# Patient Record
Sex: Male | Born: 1955
Health system: Southern US, Community
[De-identification: ages and names within clinical notes are randomized; demographics above are authoritative.]

## PROBLEM LIST (undated history)

## (undated) DIAGNOSIS — E079 Disorder of thyroid, unspecified: Secondary | ICD-10-CM

## (undated) HISTORY — PX: NO PAST SURGERIES: SHX2092

## (undated) HISTORY — DX: Disorder of thyroid, unspecified: E07.9

---

## 1978-09-23 HISTORY — PX: COLONOSCOPY: SHX174

## 2011-11-13 ENCOUNTER — Encounter (HOSPITAL_COMMUNITY): Payer: Self-pay

## 2011-11-13 ENCOUNTER — Emergency Department (HOSPITAL_COMMUNITY)
Admission: EM | Admit: 2011-11-13 | Discharge: 2011-11-13 | Disposition: A | Discharge status: Home / Self Care | Payer: Self-pay | Attending: Emergency Medicine | Admitting: Emergency Medicine

## 2011-11-13 DIAGNOSIS — T7840XA Allergy, unspecified, initial encounter: Secondary | ICD-10-CM

## 2011-11-13 DIAGNOSIS — R21 Rash and other nonspecific skin eruption: Secondary | ICD-10-CM | POA: Insufficient documentation

## 2011-11-13 DIAGNOSIS — R220 Localized swelling, mass and lump, head: Secondary | ICD-10-CM | POA: Insufficient documentation

## 2011-11-13 MED ORDER — PREDNISONE 10 MG PO TABS: 20.0000 mg | ORAL_TABLET | Freq: Every day | ORAL | Status: DC

## 2011-11-13 MED ORDER — METHYLPREDNISOLONE SODIUM SUCC 125 MG IJ SOLR
125.0000 mg | Freq: Once | INTRAMUSCULAR | Status: AC
  Administered 2011-11-13: 125 mg via INTRAVENOUS
  Filled 2011-11-13: qty 2

## 2011-11-13 MED ORDER — FAMOTIDINE IN NACL 20-0.9 MG/50ML-% IV SOLN
20.0000 mg | Freq: Once | INTRAVENOUS | Status: AC
  Administered 2011-11-13: 20 mg via INTRAVENOUS
  Filled 2011-11-13: qty 50

## 2011-11-13 MED ORDER — DIPHENHYDRAMINE HCL 50 MG/ML IJ SOLN
25.0000 mg | Freq: Once | INTRAMUSCULAR | Status: AC
  Administered 2011-11-13: 25 mg via INTRAVENOUS
  Filled 2011-11-13: qty 1

## 2011-11-13 NOTE — ED Notes (Signed)
Pt reports broke out in rash on upper arms and legs x 1 week.  PT says he thinks it is coming from his soap or laundry detergent.  Today woke up with upper lip swollen.  Denies tongue swelling or feeling like throat closing.

## 2011-11-13 NOTE — ED Notes (Signed)
Pt reports some minor itching, no complaint of pain, no acute distress.  Red areas noted to upper extremities near the antecubital area.

## 2011-11-13 NOTE — ED Provider Notes (Signed)
History  This chart was scribed for Robert Lennert, MD by Bennett Scrape. This patient was seen in room [facility]09/[facility]09 and the patient's care was started at 4:55PM.  CSN: [id number]  Arrival date & time 11/13/11  1559   First MD Initiated Contact with Patient 11/13/11 1655    Chief Complaint  Patient presents with  . Rash     Patient is a 56 y.o. male presenting with rash. The history is provided by the patient. No language interpreter was used.  Rash  This is a new problem. The current episode started more than 2 days ago. The problem has not changed since onset.There has been no fever. The rash is present on the torso, back, left lower leg, left upper leg, right lower leg and right upper leg. The patient is experiencing no pain. Associated symptoms include itching.    Robert [name] is a 56 y.o. male who presents to the Emergency Department complaining of one week of gradual onset, constant rash described as itching on torso, back, bilateral arms and bilateral legs with associated 12 hours of gradual onset, gradually worsening upper lip swelling that he attributes to possibly an allergic reaction to new laundry detergent. He reports that he used cortisone 10 cream and took benadryl with moderate, temporary improvement in his symptoms. 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 He states that he was treated with adrenaline with resolvement of his symptoms. He denies trouble breathing, trouble swallowing, subsequent tongue swelling or fevers as associated symptoms. He does not have a h/o chronic medical conditions and denies smoking and alcohol use.  No PCP  History reviewed. No pertinent past medical history.  History reviewed. No pertinent past surgical history.  No family history on file.  History  Substance Use Topics  . Smoking status: Never Smoker   . Smokeless tobacco: Not on file  . Alcohol Use: No       Review of Systems  Constitutional: Negative for fatigue.  HENT: Positive for facial swelling (upper lip). Negative for congestion, sinus pressure and ear discharge.   Eyes: Negative for discharge.  Respiratory: Negative for cough.   Cardiovascular: Negative for chest pain.  Gastrointestinal: Negative for abdominal pain and diarrhea.  Genitourinary: Negative for frequency and hematuria.  Musculoskeletal: Negative for back pain.  Skin: Positive for itching and rash.  Neurological: Negative for seizures and headaches.  Hematological: Negative.   Psychiatric/Behavioral: Negative for hallucinations.    Allergies  Review of patient's allergies indicates no known allergies.  Home Medications   Current Outpatient Rx  Name Route Sig Dispense Refill  . DIPHENHYDRAMINE HCL 25 MG PO TABS Oral Take 25-50 mg by mouth daily as needed. For allergic reaction      Triage Vitals: BP 133/80  Pulse 73  Temp 98.2 F (36.8 C) (Oral)  Resp 20  Ht 5\' 2"  (1.575 m)  Wt 185 lb (83.915 kg)  BMI 33.84 kg/m2  SpO2 98%  Physical Exam  Nursing note and vitals reviewed. Constitutional: He is oriented to person, place, and time. He appears well-developed and well-nourished.  HENT:  Head: Normocephalic and atraumatic.       Mild upper lip swelling that is worse on the left  Eyes: Conjunctivae normal and EOM are normal. No scleral icterus.  Neck: Neck supple. No thyromegaly present.  Cardiovascular: Normal rate and regular rhythm.  Exam reveals no gallop and no friction rub.  No murmur heard. Pulmonary/Chest: Effort normal and breath sounds normal. No stridor. He has no wheezes. He has no rales. He exhibits no tenderness.  Abdominal: He exhibits no distension. There is no tenderness. There is no rebound.  Musculoskeletal: Normal range of motion. He exhibits no edema.  Lymphadenopathy:    He has no cervical adenopathy.  Neurological: He is alert and oriented to person, place, and time.  Coordination normal.  Skin: Skin is warm and dry. Rash noted. No erythema.       Macular papular rash on the torso, legs, arms and back  Psychiatric: He has a normal mood and affect. His behavior is normal.    ED Course  Procedures (including critical care time)  DIAGNOSTIC STUDIES: Oxygen Saturation is 98 on room air, normal as interpreted by me.   COORDINATION OF CARE: 4:59PM- Patient informed of current plan for treatment and evaluation and agrees with plan at this time.   5:15PM-Ordered 20 mg IVPB Pepcid, 25 mg injection of Benadryl and 125 mg injection of Solumedrol   7:30 PM-Pt rechecked and feels improved after medications. Upon re-exam, pt's rash has also improved.  Discussed discharge plan which includes prednisone for 4 to 5 days with pt and pt agreed to plan.  Labs Reviewed - No data to display No results found.   No diagnosis found.    MDM      The chart was scribed for me under my direct supervision.  I personally performed the history, physical, and medical decision making and all procedures in the evaluation of this patient.Robert Lennert, MD 11/13/11 [phone]

## 2011-11-20 ENCOUNTER — Emergency Department (HOSPITAL_COMMUNITY)
Admission: EM | Admit: 2011-11-20 | Discharge: 2011-11-20 | Disposition: A | Discharge status: Home / Self Care | Payer: Self-pay | Attending: Emergency Medicine | Admitting: Emergency Medicine

## 2011-11-20 ENCOUNTER — Encounter (HOSPITAL_COMMUNITY): Payer: Self-pay | Admitting: *Deleted

## 2011-11-20 DIAGNOSIS — L259 Unspecified contact dermatitis, unspecified cause: Secondary | ICD-10-CM | POA: Insufficient documentation

## 2011-11-20 DIAGNOSIS — L309 Dermatitis, unspecified: Secondary | ICD-10-CM

## 2011-11-20 MED ORDER — HYDROXYZINE HCL 25 MG PO TABS
ORAL_TABLET | ORAL | Status: AC
  Administered 2011-11-20: 50 mg
  Filled 2011-11-20: qty 2

## 2011-11-20 MED ORDER — HYDROXYZINE HCL 50 MG/ML IM SOLN: 50.0000 mg | Freq: Once | INTRAMUSCULAR | Status: DC

## 2011-11-20 MED ORDER — FAMOTIDINE 20 MG PO TABS
20.0000 mg | ORAL_TABLET | Freq: Once | ORAL | Status: AC
  Administered 2011-11-20: 20 mg via ORAL
  Filled 2011-11-20: qty 1

## 2011-11-20 MED ORDER — PREDNISONE 20 MG PO TABS: 40.0000 mg | ORAL_TABLET | Freq: Every day | ORAL | Status: DC

## 2011-11-20 MED ORDER — METHYLPREDNISOLONE SODIUM SUCC 125 MG IJ SOLR
125.0000 mg | Freq: Once | INTRAMUSCULAR | Status: AC
  Administered 2011-11-20: 125 mg via INTRAMUSCULAR
  Filled 2011-11-20: qty 2

## 2011-11-20 NOTE — ED Notes (Signed)
Pt states has had this rash before and went away while taking the prednisone. Rash is predominately on upper back at this time. Pt reports rash was covering his back earlier in the day. Denies new products, denies SOB. NAD noted

## 2011-11-20 NOTE — ED Notes (Signed)
Itching rash, seen here for same, had been getting better,but started having rash again when finished the prednisone.

## 2011-11-20 NOTE — ED Provider Notes (Signed)
History     CSN: [id number]  Arrival date & time 11/20/11  1639   None     Chief Complaint  Patient presents with  . Rash    (Consider location/radiation/quality/duration/timing/severity/associated sxs/prior treatment) Patient is a 56 y.o. male presenting with rash. The history is provided by the patient.  Rash  This is a new problem. The current episode started 3 to 5 hours ago. The problem has been gradually worsening. The problem is associated with an unknown factor. There has been no fever. The rash is present on the back, left arm, left buttock, right arm and right buttock. The patient is experiencing no pain. Associated symptoms include itching. Associated symptoms comments: hives. Treatments tried: steroid cream (otc) The treatment provided mild relief.    History reviewed. No pertinent past medical history.  History reviewed. No pertinent past surgical history.  History reviewed. No pertinent family history.  History  Substance Use Topics  . Smoking status: Never Smoker   . Smokeless tobacco: Not on file  . Alcohol Use: No      Review of Systems  Constitutional: Negative for activity change.       All ROS Neg except as noted in HPI  HENT: Negative for nosebleeds and neck pain.   Eyes: Negative for photophobia and discharge.  Respiratory: Negative for cough, shortness of breath and wheezing.   Cardiovascular: Negative for chest pain and palpitations.  Gastrointestinal: Negative for abdominal pain and blood in stool.  Genitourinary: Negative for dysuria, frequency and hematuria.  Musculoskeletal: Negative for back pain and arthralgias.  Skin: Positive for itching and rash.  Neurological: Negative for dizziness, seizures and speech difficulty.  Psychiatric/Behavioral: Negative for hallucinations and confusion.    Allergies  Review of patient's allergies indicates no known allergies.  Home Medications   Current Outpatient Rx  Name Route Sig Dispense Refill   . DIPHENHYDRAMINE HCL 25 MG PO TABS Oral Take 25-50 mg by mouth daily as needed. For allergic reaction    . PREDNISONE 10 MG PO TABS Oral Take 2 tablets (20 mg total) by mouth daily. 10 tablet 0    BP 151/76  Pulse 65  Temp 98 F (36.7 C) (Oral)  Resp 20  Ht 6' (1.829 m)  Wt 195 lb (88.451 kg)  BMI 26.45 kg/m2  SpO2 99%  Physical Exam  Nursing note and vitals reviewed. Constitutional: He is oriented to person, place, and time. He appears well-developed and well-nourished.  Non-toxic appearance.  HENT:  Head: Normocephalic.  Right Ear: Tympanic membrane and external ear normal.  Left Ear: Tympanic membrane and external ear normal.       No rash noted in the mouth. The uvula is in the midline, airway is patent. No swelling or abnormality of the tongue. No swelling under the tongue.  Eyes: EOM and lids are normal. Pupils are equal, round, and reactive to light.  Neck: Normal range of motion. Neck supple. Carotid bruit is not present.  Cardiovascular: Normal rate, regular rhythm, normal heart sounds, intact distal pulses and normal pulses.   Pulmonary/Chest: Breath sounds normal. No respiratory distress.  Abdominal: Soft. Bowel sounds are normal. There is no tenderness. There is no guarding.  Musculoskeletal: Normal range of motion.       Few hives noted on the right and left forearm. Hives noted on the upper back.  Lymphadenopathy:       Head (right side): No submandibular adenopathy present.       Head (left side): No submandibular  adenopathy present.    He has no cervical adenopathy.  Neurological: He is alert and oriented to person, place, and time. He has normal strength. No cranial nerve deficit or sensory deficit.  Skin: Skin is warm and dry.  Psychiatric: He has a normal mood and affect. His speech is normal.    ED Course  Procedures (including critical care time)  Labs Reviewed - No data to display No results found.   No diagnosis found.    MDM  I have  reviewed nursing notes, vital signs, and all appropriate lab and imaging results for this patient. Rx for  Deltasone given by Dr Algis Downs. Preston Fleeting.                                                                                                      Kathie Dike, [place] 11/23/11 [phone]

## 2011-11-20 NOTE — ED Provider Notes (Signed)
History     CSN: [id number]  Arrival date & time 11/20/11  1639   First MD Initiated Contact with Patient 11/20/11 1922      Chief Complaint  Patient presents with  . Rash    (Consider location/radiation/quality/duration/timing/severity/associated sxs/prior treatment) Patient is a 56 y.o. male presenting with rash. The history is provided by the patient.  Rash   He had broken out in a rash about a week ago was seen in the emergency department and sent home with prescription for prednisone. Rash away but started to come back today after completing his course of prednisone. Today, rashes on his buttock and on his back. He applied some hydrocortisone cream, and the rash on his buttock has resolved. The rash is quite paretic. He denies any difficulty breathing or swelling. He denies any unusual contacts. 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  History reviewed. No pertinent past medical history.  History reviewed. No pertinent past surgical history.  History reviewed. No pertinent family history.  History  Substance Use Topics  . Smoking status: Never Smoker   . Smokeless tobacco: Not on file  . Alcohol Use: No      Review of Systems  Skin: Positive for rash.  All other systems reviewed and are negative.    Allergies  Review of patient's allergies indicates no known allergies.  Home Medications   Current Outpatient Rx  Name Route Sig Dispense Refill  . DIPHENHYDRAMINE HCL 25 MG PO TABS Oral Take 25-50 mg by mouth daily as needed. For allergic reaction    . PREDNISONE 10 MG PO TABS Oral Take 2 tablets (20 mg total) by mouth daily. 10 tablet 0  . PREDNISONE 20 MG PO TABS Oral Take 2 tablets (40 mg total) by mouth daily. 20 tablet 0    BP 151/76  Pulse 65  Temp 98 F (36.7 C) (Oral)  Resp 20  Ht 6' (1.829 m)  Wt 195 lb (88.451 kg)  BMI 26.45 kg/m2  SpO2 99%  Physical  Exam  Nursing note and vitals reviewed. 56 year old male, resting comfortably and in no acute distress. Vital signs are significant for mild hypertension with blood pressure 120/76. Oxygen saturation is 99%, which is normal. Head is normocephalic and atraumatic. PERRLA, EOMI. Oropharynx is clear. Neck is nontender and supple without adenopathy or JVD. Back is nontender and there is no CVA tenderness. Lungs are clear without rales, wheezes, or rhonchi. Chest is nontender. Heart has regular rate and rhythm without murmur. Abdomen is soft, flat, nontender without masses or hepatosplenomegaly and peristalsis is normoactive. Extremities have no cyanosis or edema, full range of motion is present. Skin is warm and dry. There is a rash present in the left upper back which consists of a 3 probe of vesicles which are in a linear pattern and on erythematous base. Neurologic: Mental status is normal, cranial nerves are intact, there are no motor or sensory deficits.   ED Course  Procedures (including critical care time)  Labs Reviewed - No data to display No results found.   1. Dermatitis       MDM  Rash which has appearance of a contact dermatitis in the one area where I am able to see if. Linear grouped vesicles are strongly suggestive of contact dermatitis. Prior records are reviewed and he was given a five-day course of prednisone when seen 7  days ago. If it is a poison ivy type of rash, he will need several weeks of steroids. He is sent home with prescription for prednisone for the next 10 days and is to continue using over-the-counter antihistamines and topical hydrocortisone as needed.        Dione Booze, MD 11/20/11 1946

## 2011-11-23 NOTE — ED Provider Notes (Signed)
Medical screening examination/treatment/procedure(s) were performed by non-physician practitioner and as supervising physician I was immediately available for consultation/collaboration.   Laray Anger, DO 11/23/11 2340

## 2012-02-07 SURGERY — LEFT HEART CATH
Anesthesia: LOCAL

## 2012-02-07 NOTE — H&P (Signed)
Mrs. echo is 57 years of age and while exercising this evening developed lower sternal central pressure. He came to the emergency room where EKG reveals ST elevation in V1 through 3 and also in lead 1 and aVL. He continues to have grade 8 of 10 discomfort. No prior history of heart disease. He has no risk factors. The patient was counseled concerning the procedure including the radial approach, risk of death, stroke, myocardial infarction, allergy, kidney injury, among other potential complications including bleeding. The patient understands procedure and is willing to proceed.

## 2012-02-08 ENCOUNTER — Ambulatory Visit (HOSPITAL_COMMUNITY): Admission: RE | Admit: 2012-02-08 | Payer: Self-pay | Source: Ambulatory Visit | Admitting: Interventional Cardiology

## 2012-02-24 ENCOUNTER — Emergency Department (HOSPITAL_COMMUNITY)
Admission: EM | Admit: 2012-02-24 | Discharge: 2012-02-24 | Disposition: A | Discharge status: Home / Self Care | Payer: Self-pay | Attending: Emergency Medicine | Admitting: Emergency Medicine

## 2012-02-24 ENCOUNTER — Encounter (HOSPITAL_COMMUNITY): Payer: Self-pay | Admitting: Emergency Medicine

## 2012-02-24 DIAGNOSIS — L509 Urticaria, unspecified: Secondary | ICD-10-CM | POA: Insufficient documentation

## 2012-02-24 MED ORDER — METHYLPREDNISOLONE SODIUM SUCC 125 MG IJ SOLR
125.0000 mg | Freq: Once | INTRAMUSCULAR | Status: AC
  Administered 2012-02-24: 125 mg via INTRAMUSCULAR

## 2012-02-24 MED ORDER — LORATADINE 10 MG PO TABS
10.0000 mg | ORAL_TABLET | Freq: Once | ORAL | Status: AC
  Administered 2012-02-24: 10 mg via ORAL
  Filled 2012-02-24: qty 1

## 2012-02-24 MED ORDER — HYDROXYZINE HCL 25 MG PO TABS: ORAL_TABLET | ORAL | Status: DC

## 2012-02-24 MED ORDER — FAMOTIDINE 20 MG PO TABS
20.0000 mg | ORAL_TABLET | Freq: Once | ORAL | Status: AC
  Administered 2012-02-24: 20 mg via ORAL
  Filled 2012-02-24: qty 1

## 2012-02-24 MED ORDER — PREDNISONE 10 MG PO TABS: ORAL_TABLET | ORAL | Status: DC

## 2012-02-24 MED ORDER — METHYLPREDNISOLONE SODIUM SUCC 125 MG IJ SOLR
125.0000 mg | Freq: Once | INTRAMUSCULAR | Status: DC
  Filled 2012-02-24: qty 2

## 2012-02-24 MED ORDER — FEXOFENADINE HCL 180 MG PO TABS: ORAL_TABLET | ORAL | Status: DC

## 2012-02-24 NOTE — ED Notes (Signed)
Pt c/o rash to upper back/hips/legs/arms since yesterday.

## 2012-02-24 NOTE — ED Provider Notes (Signed)
History     CSN: [id number]  Arrival date & time 02/24/12  1626   First MD Initiated Contact with Patient 02/24/12 1654      Chief Complaint  Patient presents with  . Rash    (Consider location/radiation/quality/duration/timing/severity/associated sxs/prior treatment) Patient is a 57 y.o. male presenting with rash. The history is provided by the patient.  Rash  This is a new problem. The current episode started 1 to 2 hours ago. The problem has been gradually worsening. The problem is associated with an unknown factor. There has been no fever. The rash is present on the back, left buttock, left upper leg, right upper leg, left arm and right arm. The pain is moderate. The pain has been fluctuating since onset. Pertinent negatives include no itching. He has tried antihistamines for the symptoms. The treatment provided no relief.    History reviewed. No pertinent past medical history.  History reviewed. No pertinent past surgical history.  No family history on file.  History  Substance Use Topics  . Smoking status: Never Smoker   . Smokeless tobacco: Not on file  . Alcohol Use: No      Review of Systems  Constitutional: Negative for activity change.       All ROS Neg except as noted in HPI  HENT: Negative for nosebleeds and neck pain.   Eyes: Negative for photophobia and discharge.  Respiratory: Negative for cough, shortness of breath and wheezing.   Cardiovascular: Negative for chest pain and palpitations.  Gastrointestinal: Negative for abdominal pain and blood in stool.  Genitourinary: Negative for dysuria, frequency and hematuria.  Musculoskeletal: Negative for back pain and arthralgias.  Skin: Positive for rash. Negative for itching.  Neurological: Negative for dizziness, seizures and speech difficulty.  Psychiatric/Behavioral: Negative for hallucinations and confusion.    Allergies  Review of patient's allergies indicates no known allergies.  Home Medications    Current Outpatient Rx  Name  Route  Sig  Dispense  Refill  . DIPHENHYDRAMINE HCL 25 MG PO TABS   Oral   Take 25-50 mg by mouth daily as needed. For allergic reaction           BP 132/73  Pulse 76  Temp 98.2 F (36.8 C)  Resp 18  Ht 6' (1.829 m)  Wt 195 lb (88.451 kg)  BMI 26.45 kg/m2  SpO2 100%  Physical Exam  Nursing note and vitals reviewed. Constitutional: He is oriented to person, place, and time. He appears well-developed and well-nourished.  Non-toxic appearance.  HENT:  Head: Normocephalic.  Right Ear: Tympanic membrane and external ear normal.  Left Ear: Tympanic membrane and external ear normal.       Airway patent. No rash or swelling in mouth and throat.  Eyes: EOM and lids are normal. Pupils are equal, round, and reactive to light.  Neck: Normal range of motion. Neck supple. Carotid bruit is not present. No tracheal deviation present.  Cardiovascular: Normal rate, regular rhythm, normal heart sounds, intact distal pulses and normal pulses.   Pulmonary/Chest: Breath sounds normal. No stridor. No respiratory distress. He has no wheezes.  Abdominal: Soft. Bowel sounds are normal. There is no tenderness. There is no guarding.  Musculoskeletal: Normal range of motion.  Lymphadenopathy:       Head (right side): No submandibular adenopathy present.       Head (left side): No submandibular adenopathy present.    He has no cervical adenopathy.  Neurological: He is alert and oriented to person, place,  and time. He has normal strength. No cranial nerve deficit or sensory deficit.  Skin: Skin is warm and dry. Rash noted.       Pt has hives on the upper back, both arms, the left thigh and hip.  Psychiatric: He has a normal mood and affect. His speech is normal.    ED Course  Procedures (including critical care time)  Labs Reviewed - No data to display No results found. Pulse Ox 100% on RA. WNL by my interpretation.  No diagnosis found.    MDM  I have  reviewed nursing notes, vital signs, and all appropriate lab and imaging results for this patient. Pt has hives present at multiple sites. Pt unsure of trigger.  Airway patent. He has had this problem 3 to 4 months ago. He has not seen an allergist because of financial issues. Pt speaks in complete sentences, and is in not distress. Plan Rx for prednisone taper. Allegra each AM, Vistaril at hs. Pt to return if any changes or problem.       Kathie Dike, [place] 02/24/12 [phone]

## 2012-02-25 NOTE — ED Provider Notes (Signed)
Medical screening examination/treatment/procedure(s) were performed by non-physician practitioner and as supervising physician I was immediately available for consultation/collaboration. Devoria Albe, MD, [facility]   Ward Givens, MD 02/25/12 [phone]

## 2012-04-22 ENCOUNTER — Encounter (HOSPITAL_COMMUNITY): Payer: Self-pay | Admitting: Emergency Medicine

## 2012-04-22 ENCOUNTER — Emergency Department (HOSPITAL_COMMUNITY)
Admission: EM | Admit: 2012-04-22 | Discharge: 2012-04-22 | Disposition: A | Discharge status: Home / Self Care | Payer: Self-pay | Attending: Emergency Medicine | Admitting: Emergency Medicine

## 2012-04-22 DIAGNOSIS — R21 Rash and other nonspecific skin eruption: Secondary | ICD-10-CM | POA: Insufficient documentation

## 2012-04-22 DIAGNOSIS — L509 Urticaria, unspecified: Secondary | ICD-10-CM | POA: Insufficient documentation

## 2012-04-22 MED ORDER — PREDNISONE 10 MG PO TABS: ORAL_TABLET | ORAL | Status: DC

## 2012-04-22 MED ORDER — EPINEPHRINE 0.3 MG/0.3ML IJ DEVI: 0.3000 mg | INTRAMUSCULAR | Status: DC | PRN

## 2012-04-22 MED ORDER — HYDROXYZINE HCL 25 MG PO TABS: 25.0000 mg | ORAL_TABLET | Freq: Four times a day (QID) | ORAL | Status: DC

## 2012-04-22 MED ORDER — METHYLPREDNISOLONE SODIUM SUCC 125 MG IJ SOLR
125.0000 mg | Freq: Once | INTRAMUSCULAR | Status: AC
  Administered 2012-04-22: 125 mg via INTRAMUSCULAR
  Filled 2012-04-22: qty 2

## 2012-04-22 MED ORDER — FAMOTIDINE 20 MG PO TABS: 20.0000 mg | ORAL_TABLET | Freq: Two times a day (BID) | ORAL | Status: DC

## 2012-04-22 MED ORDER — LORATADINE 10 MG PO TABS
10.0000 mg | ORAL_TABLET | Freq: Once | ORAL | Status: AC
  Administered 2012-04-22: 10 mg via ORAL
  Filled 2012-04-22: qty 1

## 2012-04-22 MED ORDER — FAMOTIDINE 20 MG PO TABS
20.0000 mg | ORAL_TABLET | Freq: Once | ORAL | Status: AC
  Administered 2012-04-22: 20 mg via ORAL
  Filled 2012-04-22: qty 1

## 2012-04-22 NOTE — ED Notes (Signed)
Patient complaining of hives on arms, back, and buttocks starting at 0500 this morning. Unsure of cause.

## 2012-04-22 NOTE — ED Provider Notes (Signed)
History     CSN: [id number]  Arrival date & time 04/22/12  4098   First MD Initiated Contact with Patient 04/22/12 [phone]      Chief Complaint  Patient presents with  . Allergic Reaction     Patient is a 57 y.o. male presenting with allergic reaction. The history is provided by the patient.  Allergic Reaction The primary symptoms are  rash and urticaria. The primary symptoms do not include wheezing, shortness of breath, nausea, vomiting, diarrhea, dizziness or angioedema. The current episode started 1 to 2 hours ago. The problem has been gradually worsening. This is a recurrent problem.  The rash is associated with itching.  Significant symptoms also include itching.  nothing is improving his symptoms  Pt reports he woke up with "hives" to his arms and back this morning.  He denies SOB.  He denies facial or tongue swelling.  He reports he has had this before.  He is unsure of what causes his symptoms.  He reports he is supposed to see allergist next month when his insurance starts.     PMH - none  History reviewed. No pertinent past surgical history.  History reviewed. No pertinent family history.  History  Substance Use Topics  . Smoking status: Never Smoker   . Smokeless tobacco: Not on file  . Alcohol Use: No      Review of Systems  Constitutional: Negative for fever.  HENT: Negative for facial swelling.   Respiratory: Negative for shortness of breath and wheezing.   Cardiovascular: Negative for chest pain.  Gastrointestinal: Negative for nausea, vomiting and diarrhea.  Skin: Positive for itching and rash.  Allergic/Immunologic:       Unknown cause of urticaria   Neurological: Negative for dizziness, syncope and weakness.  Psychiatric/Behavioral: Negative for agitation.  All other systems reviewed and are negative.    Allergies  Review of patient's allergies indicates no known allergies.  Home Medications   Current Outpatient Rx  Name  Route  Sig  Dispense   Refill  . diphenhydrAMINE (BENADRYL) 25 MG tablet   Oral   Take 25-50 mg by mouth daily as needed. For allergic reaction         . fexofenadine (ALLEGRA) 180 MG tablet      1 po qAM for hives and itching   10 tablet   0   . hydrOXYzine (ATARAX/VISTARIL) 25 MG tablet      1 at hs or q6h prn hives and itching.   20 tablet   0   . predniSONE (DELTASONE) 10 MG tablet      6,5,4,3,2,1 - take with food   21 tablet   0     BP 145/96  Pulse 87  Temp(Src) 98.1 F (36.7 C) (Oral)  Resp 20  Ht 6' (1.829 m)  Wt 210 lb (95.255 kg)  BMI 28.47 kg/m2  SpO2 98%  Physical Exam CONSTITUTIONAL: Well developed/well nourished HEAD: Normocephalic/atraumatic EYES: EOMI/PERRL ENMT: Mucous membranes moist, no facial or tongue swelling.  Uvula midline.  Voice is not muffled. No stridor NECK: supple no meningeal signs CV: S1/S2 noted, no murmurs/rubs/gallops noted LUNGS: Lungs are clear to auscultation bilaterally, no apparent distress ABDOMEN: soft, nontender, no rebound or guarding NEURO: Pt is awake/alert, moves all extremitiesx4 EXTREMITIES: pulses normal, full ROM SKIN: warm, color normal Urticaria noted to his back.   PSYCH: no abnormalities of mood noted  ED Course  Procedures  6:08 AM Pt presents with isolated urticaria of unknown etiology.  He has had this before.  He reports meds from last visit in February helped him in the ED (solumedrol/claritin/pepcid) Will restart those meds in the ED He has never had to use epipen, will prescribe this and I advised him of when to use this medication 6:35 AM Pt improved.  He is ready for d/c home  MDM  Nursing notes including past medical history and social history reviewed and considered in documentation Previous records reviewed and considered - recent ED visits reviewed         Joya Gaskins, MD 04/22/12 [phone]

## 2012-06-07 ENCOUNTER — Encounter (HOSPITAL_COMMUNITY): Payer: Self-pay

## 2012-06-07 ENCOUNTER — Emergency Department (HOSPITAL_COMMUNITY)
Admission: EM | Admit: 2012-06-07 | Discharge: 2012-06-07 | Disposition: A | Discharge status: Home / Self Care | Payer: Self-pay | Attending: Emergency Medicine | Admitting: Emergency Medicine

## 2012-06-07 DIAGNOSIS — R220 Localized swelling, mass and lump, head: Secondary | ICD-10-CM | POA: Insufficient documentation

## 2012-06-07 DIAGNOSIS — Z79899 Other long term (current) drug therapy: Secondary | ICD-10-CM | POA: Insufficient documentation

## 2012-06-07 DIAGNOSIS — L509 Urticaria, unspecified: Secondary | ICD-10-CM | POA: Insufficient documentation

## 2012-06-07 DIAGNOSIS — R21 Rash and other nonspecific skin eruption: Secondary | ICD-10-CM | POA: Insufficient documentation

## 2012-06-07 MED ORDER — LORATADINE 10 MG PO TABS
10.0000 mg | ORAL_TABLET | Freq: Once | ORAL | Status: AC
  Administered 2012-06-07: 10 mg via ORAL
  Filled 2012-06-07: qty 1

## 2012-06-07 MED ORDER — METHYLPREDNISOLONE SODIUM SUCC 125 MG IJ SOLR
125.0000 mg | Freq: Once | INTRAMUSCULAR | Status: AC
  Administered 2012-06-07: 125 mg via INTRAMUSCULAR
  Filled 2012-06-07: qty 2

## 2012-06-07 MED ORDER — PREDNISONE (PAK) 10 MG PO TABS: 10.0000 mg | ORAL_TABLET | Freq: Every day | ORAL | Status: DC

## 2012-06-07 MED ORDER — HYDROXYZINE HCL 25 MG PO TABS: 25.0000 mg | ORAL_TABLET | Freq: Four times a day (QID) | ORAL | Status: DC

## 2012-06-07 MED ORDER — LORATADINE 10 MG PO TABS: 10.0000 mg | ORAL_TABLET | Freq: Every day | ORAL | Status: DC

## 2012-06-07 MED ORDER — FAMOTIDINE 20 MG PO TABS
20.0000 mg | ORAL_TABLET | Freq: Once | ORAL | Status: AC
  Administered 2012-06-07: 20 mg via ORAL
  Filled 2012-06-07: qty 1

## 2012-06-07 NOTE — ED Notes (Signed)
Breaking out in hives. Lip swelling per pt. Denies any trouble breathing or swallowing per pt. Minor wheezing noted in upper right lobe. No distress noted at this time.

## 2012-06-07 NOTE — ED Notes (Signed)
Reports itching & swelling that started yesterday. Pt states this has happened before, seems like every other month. Pt to have allergy testing in June or July. Pt denies any new foods, meds, clothing or detergents.

## 2012-06-07 NOTE — ED Provider Notes (Signed)
History    This chart was scribed for Geoffery Lyons, MD by Bennett Scrape, ED Scribe. This patient was seen in room APA02/APA02 and the patient's care was started at 9:30 pm.  CSN: [id number]  Arrival date & time 06/07/12  1909   First MD Initiated Contact with Patient 06/07/12 2122      Chief Complaint  Patient presents with  . Urticaria  . Oral Swelling   The history is provided by the patient. No language interpreter was used.    HPI Comments: Imran Nuon is a 57 y.o. male who presents to the Emergency Department complaining of a gradual onset, gradually worsening, constant rash on his arms, back, and the top of his posterior thighs with associated lip tingling. Pt reports he has had a continual problem with hives since last year, and this his most previous episode with a rash occurred in April 2014. He was prescribed prednisone, hydroxyzine, Claritin and Pepcid with improvement.  He has a prescription for an epi pen but states that he didn't get it filled due to not having throat swelling. Pt states he has changed detergents from Tide to ALL to avoid dyes and fragrances. He reports that he has an appointment with an allergist this summer to f/u with. He denies SOB, trouble swallowing, CP and facial swelling as associated symptoms. He denies smoking or drinking.   History reviewed. No pertinent past medical history.  History reviewed. No pertinent past surgical history.  No family history on file.  History  Substance Use Topics  . Smoking status: Never Smoker   . Smokeless tobacco: Not on file  . Alcohol Use: No      Review of Systems  HENT: Negative for facial swelling and trouble swallowing.   Respiratory: Negative for shortness of breath.   Cardiovascular: Negative for chest pain.  Skin: Positive for rash.    Allergies  Review of patient's allergies indicates no known allergies.  Home Medications   Current Outpatient Rx  Name  Route  Sig  Dispense  Refill  .  EPINEPHrine (EPIPEN) 0.3 mg/0.3 mL DEVI   Intramuscular   Inject 0.3 mLs (0.3 mg total) into the muscle as needed.   2 Device   1   . famotidine (PEPCID) 20 MG tablet   Oral   Take 1 tablet (20 mg total) by mouth 2 (two) times daily.   20 tablet   0   . hydrOXYzine (ATARAX/VISTARIL) 25 MG tablet   Oral   Take 1 tablet (25 mg total) by mouth every 6 (six) hours.   12 tablet   0   . predniSONE (DELTASONE) 10 MG tablet      6 tablets PO for first day, then 5 tablets for one day, 4 tablets for one day, 3 tablets for one day, 2 tablets for one day, then one tablet then stop Take with food   21 tablet   0    ED Triage Vitals  Enc Vitals Group     BP 06/07/12 1921 127/73 mmHg     Pulse Rate 06/07/12 1921 69     Resp 06/07/12 1921 16     Temp 06/07/12 1921 97.7 F (36.5 C)     Temp src 06/07/12 1921 Oral     SpO2 06/07/12 1921 100 %     Weight --      Height --      Head Cir --      Peak Flow --  Pain Score 06/07/12 1922 Eight                 Physical Exam  Nursing note and vitals reviewed. Constitutional: He is oriented to person, place, and time. He appears well-developed and well-nourished. No distress.  HENT:  Head: Normocephalic and atraumatic.  Eyes: EOM are normal. Pupils are equal, round, and reactive to light.  Neck: Normal range of motion. Neck supple. No tracheal deviation present.  Cardiovascular: Normal rate and normal heart sounds.   Pulmonary/Chest: Effort normal and breath sounds normal. No respiratory distress. He has no wheezes. He has no rales.  Abdominal: Soft. Bowel sounds are normal.  Musculoskeletal: Normal range of motion.  Neurological: He is alert and oriented to person, place, and time.  Skin: Skin is warm and dry. Rash noted.  There is an urticarial rash to the medial aspect of both thighs.   Psychiatric: He has a normal mood and affect. His behavior is normal.    ED Course  Procedures (including critical care time)  DIAGNOSTIC  STUDIES: Oxygen Saturation is 100% on room air, normal by my interpretation.    COORDINATION OF CARE:  9:37 PM Discussed treatment plan with pt which includes a shot of epinephrine, a dosage of steroids, and benadryl . Pt stated that he would prefer different medicine including prednisone and hydroxyzine,  and Claritin. Pt agrees to the plan.   Labs Reviewed - No data to display  No results found.   No diagnosis found.    MDM  Will treat with solumedrol, pepcid, home with prednisone, claritin, and hydroxyzine.      I personally performed the services described in this documentation, which was scribed in my presence. The recorded information has been reviewed and is accurate.    Geoffery Lyons, MD 06/08/12 [phone]

## 2012-06-07 NOTE — ED Notes (Signed)
Pt alert & oriented x4, stable gait. Patient given discharge instructions, paperwork & prescription(s). Patient  instructed to stop at the registration desk to finish any additional paperwork. Patient verbalized understanding. Pt left department w/ no further questions. 

## 2012-06-07 NOTE — ED Notes (Signed)
Pt states he gets a shot & a script of prednisone then goes home. Looking at past records shot is of solumedrol.

## 2013-12-03 ENCOUNTER — Ambulatory Visit: Payer: Self-pay | Admitting: Family Medicine

## 2013-12-07 ENCOUNTER — Other Ambulatory Visit: Payer: Self-pay | Admitting: Family Medicine

## 2013-12-07 ENCOUNTER — Ambulatory Visit (INDEPENDENT_AMBULATORY_CARE_PROVIDER_SITE_OTHER): Payer: BC Managed Care – PPO | Admitting: Family Medicine

## 2013-12-07 VITALS — BP 133/80 | HR 87 | Ht 72.0 in

## 2013-12-07 DIAGNOSIS — T7840XA Allergy, unspecified, initial encounter: Secondary | ICD-10-CM

## 2013-12-07 MED ORDER — HYDROXYZINE HCL 25 MG PO TABS: 25.0000 mg | ORAL_TABLET | Freq: Four times a day (QID) | ORAL | Status: DC

## 2013-12-07 MED ORDER — EPINEPHRINE 0.3 MG/0.3ML IJ SOAJ: 0.3000 mg | Freq: Once | INTRAMUSCULAR | Status: DC

## 2013-12-07 MED ORDER — METHYLPREDNISOLONE SODIUM SUCC 125 MG IJ SOLR
125.0000 mg | Freq: Once | INTRAMUSCULAR | Status: AC
  Administered 2013-12-07: 125 mg via INTRAMUSCULAR

## 2013-12-07 MED ORDER — EPINEPHRINE HCL 1 MG/ML IJ SOLN
0.3000 mg | Freq: Once | INTRAMUSCULAR | Status: AC
  Administered 2013-12-07: 0.3 mg via SUBCUTANEOUS

## 2013-12-07 MED ORDER — METHYLPREDNISOLONE (PAK) 4 MG PO TABS: ORAL_TABLET | ORAL | Status: DC

## 2013-12-07 NOTE — Progress Notes (Signed)
   Subjective:    Patient ID: Robert [name], male    DOB: 04-07-1955, 58 y.o.   MRN: [id number]  HPI C/o lip swelling this am at work.  He has hx of allergies.  Denies SOB  And denies wheezing.   Review of Systems No chest pain, SOB, HA, dizziness, vision change, N/V, diarrhea, constipation, dysuria, urinary urgency or frequency, myalgias, arthralgias or rash.     Objective:   Physical Exam  Vital signs noted  Well developed well nourished male.  HEENT - Head atraumatic Normocephalic                Eyes - PERRLA, Conjuctiva - clear Sclera- Clear EOMI                Ears - EAC's Wnl TM's Wnl Gross Hearing WNL                Nose - Nares patent                 Throat - oropharanx wnl and upper lip is swollen Respiratory - Lungs CTA bilateral Cardiac - RRR S1 and S2 without murmur GI - Abdomen soft Nontender and bowel sounds active x 4 Extremities - No edema. Neuro - Grossly intact.      Assessment & Plan:  Allergic reaction, initial encounter - Plan: EPINEPHrine (ADRENALIN) injection 0.3 mg, methylPREDNISolone sodium succinate (SOLU-MEDROL) 125 mg/2 mL injection 125 mg Take atarax when get home and follow up prn.  Deatra CanterWilliam J Tammela Bales FNP

## 2013-12-08 ENCOUNTER — Encounter: Payer: Self-pay | Admitting: Family Medicine

## 2013-12-08 ENCOUNTER — Ambulatory Visit (INDEPENDENT_AMBULATORY_CARE_PROVIDER_SITE_OTHER): Payer: BC Managed Care – PPO | Admitting: Family Medicine

## 2013-12-08 VITALS — BP 113/65 | HR 78 | Temp 97.1°F | Ht 70.0 in | Wt 192.8 lb

## 2013-12-08 DIAGNOSIS — T7840XA Allergy, unspecified, initial encounter: Secondary | ICD-10-CM

## 2013-12-08 DIAGNOSIS — Z1211 Encounter for screening for malignant neoplasm of colon: Secondary | ICD-10-CM

## 2013-12-08 DIAGNOSIS — Z0000 Encounter for general adult medical examination without abnormal findings: Secondary | ICD-10-CM

## 2013-12-08 LAB — POCT CBC
Granulocyte percent: 74.3 %G (ref 37–80)
HCT, POC: 41.4 % — AB (ref 43.5–53.7)
Hemoglobin: 13.7 g/dL — AB (ref 14.1–18.1)
Lymph, poc: 3.2 (ref 0.6–3.4)
MCH, POC: 28.2 pg (ref 27–31.2)
MCHC: 33.1 g/dL (ref 31.8–35.4)
MCV: 85.2 fL (ref 80–97)
MPV: 7.5 fL (ref 0–99.8)
POC Granulocyte: 11.6 — AB (ref 2–6.9)
POC LYMPH PERCENT: 20.8 %L (ref 10–50)
Platelet Count, POC: 245 10*3/uL (ref 142–424)
RBC: 4.9 M/uL (ref 4.69–6.13)
RDW, POC: 13.6 %
WBC: 15.6 10*3/uL — AB (ref 4.6–10.2)

## 2013-12-08 LAB — POCT URINALYSIS DIPSTICK
Bilirubin, UA: NEGATIVE
Blood, UA: NEGATIVE
Glucose, UA: NEGATIVE
Ketones, UA: NEGATIVE
Leukocytes, UA: NEGATIVE
Nitrite, UA: NEGATIVE
Protein, UA: NEGATIVE
Spec Grav, UA: 1.015
Urobilinogen, UA: NEGATIVE
pH, UA: 6

## 2013-12-08 LAB — POCT UA - MICROSCOPIC ONLY
Bacteria, U Microscopic: NEGATIVE
Casts, Ur, LPF, POC: NEGATIVE
Crystals, Ur, HPF, POC: NEGATIVE
Mucus, UA: NEGATIVE
RBC, urine, microscopic: NEGATIVE
WBC, Ur, HPF, POC: NEGATIVE
Yeast, UA: NEGATIVE

## 2013-12-08 MED ORDER — HYDROXYZINE HCL 25 MG PO TABS
25.0000 mg | ORAL_TABLET | Freq: Four times a day (QID) | ORAL | Status: DC
Start: 2013-12-08 — End: 2014-04-13

## 2013-12-08 NOTE — Progress Notes (Signed)
   Subjective:    Patient ID: Robert [name], male    DOB: [birth year], 58 y.o.   MRN: [id number]  HPI Patient is here for CPE.  He has hx of allergic reactions and he was here yesterday with c/o angioedema and received hydroxyzine and solumedrol and he is feeling better.  He has been unable to afford epi pen and he states he would not like one because he cannot afford it at the pharmacy.  He is due for colonoscopy and needs referral  Review of Systems  Constitutional: Negative for fever.  HENT: Negative for ear pain.   Eyes: Negative for discharge.  Respiratory: Negative for cough.   Cardiovascular: Negative for chest pain.  Gastrointestinal: Negative for abdominal distention.  Endocrine: Negative for polyuria.  Genitourinary: Negative for difficulty urinating.  Musculoskeletal: Negative for gait problem and neck pain.  Skin: Negative for color change and rash.  Neurological: Negative for speech difficulty and headaches.  Psychiatric/Behavioral: Negative for agitation.       Objective:    BP 113/65 mmHg  Pulse 78  Temp(Src) 97.1 F (36.2 C) (Oral)  Ht _0  (1.778 m)  Wt 192 lb 12.8 oz (87.454 kg)  BMI 27.66 kg/m2 Physical Exam  Constitutional: He is oriented to person, place, and time. He appears well-developed and well-nourished.  HENT:  Head: Normocephalic and atraumatic.  Mouth/Throat: Oropharynx is clear and moist.  Eyes: Pupils are equal, round, and reactive to light.  Neck: Normal range of motion. Neck supple.  Cardiovascular: Normal rate and regular rhythm.   No murmur heard. Pulmonary/Chest: Effort normal and breath sounds normal.  Abdominal: Soft. Bowel sounds are normal. There is no tenderness.  Neurological: He is alert and oriented to person, place, and time.  Skin: Skin is warm and dry.  Psychiatric: He has a normal mood and affect.          Assessment & Plan:     ICD-9-CM ICD-10-CM   1. Wellness examination V70.0 Z01.89 POCT CBC     POCT UA -  Microscopic Only     POCT urinalysis dipstick     CMP14+EGFR     Lipid panel     PSA, total and free     Thyroid Panel With TSH     No Follow-up on file.  Lysbeth Penner FNP

## 2013-12-09 ENCOUNTER — Other Ambulatory Visit: Payer: Self-pay | Admitting: Family Medicine

## 2013-12-09 ENCOUNTER — Telehealth: Payer: Self-pay | Admitting: *Deleted

## 2013-12-09 DIAGNOSIS — R7989 Other specified abnormal findings of blood chemistry: Secondary | ICD-10-CM

## 2013-12-09 LAB — CMP14+EGFR
ALT: 20 IU/L (ref 0–44)
AST: 21 IU/L (ref 0–40)
Albumin/Globulin Ratio: 1.3 (ref 1.1–2.5)
Albumin: 4.3 g/dL (ref 3.5–5.5)
Alkaline Phosphatase: 176 IU/L — ABNORMAL HIGH (ref 39–117)
BUN/Creatinine Ratio: 17 (ref 9–20)
BUN: 12 mg/dL (ref 6–24)
CO2: 25 mmol/L (ref 18–29)
Calcium: 9.4 mg/dL (ref 8.7–10.2)
Chloride: 101 mmol/L (ref 97–108)
Creatinine, Ser: 0.72 mg/dL — ABNORMAL LOW (ref 0.76–1.27)
GFR calc Af Amer: 118 mL/min/{1.73_m2} (ref >59)
GFR calc non Af Amer: 102 mL/min/{1.73_m2} (ref >59)
Globulin, Total: 3.3 g/dL (ref 1.5–4.5)
Glucose: 103 mg/dL — ABNORMAL HIGH (ref 65–99)
Potassium: 4.1 mmol/L (ref 3.5–5.2)
Sodium: 141 mmol/L (ref 134–144)
Total Bilirubin: 0.4 mg/dL (ref 0.0–1.2)
Total Protein: 7.6 g/dL (ref 6.0–8.5)

## 2013-12-09 LAB — PSA, TOTAL AND FREE
PSA, Free Pct: 32 %
PSA, Free: 0.32 ng/mL
PSA: 1 ng/mL (ref 0.0–4.0)

## 2013-12-09 LAB — THYROID PANEL WITH TSH
Free Thyroxine Index: 5.6 — ABNORMAL HIGH (ref 1.2–4.9)
T3 Uptake Ratio: 31 % (ref 24–39)
T4, Total: 18.1 ug/dL (ref 4.5–12.0)
TSH: 0.005 u[IU]/mL — ABNORMAL LOW (ref 0.450–4.500)

## 2013-12-09 LAB — LIPID PANEL
Chol/HDL Ratio: 3.5 ratio units (ref 0.0–5.0)
Cholesterol, Total: 149 mg/dL (ref 100–199)
HDL: 42 mg/dL (ref >39)
LDL Calculated: 97 mg/dL (ref 0–99)
Triglycerides: 50 mg/dL (ref 0–149)
VLDL Cholesterol Cal: 10 mg/dL (ref 5–40)

## 2013-12-09 NOTE — Telephone Encounter (Signed)
Pt notified of lab results Will come in for repeat labs Lab order entered in Epic

## 2013-12-09 NOTE — Telephone Encounter (Signed)
-----   Message from Deatra CanterWilliam J Oxford, FNP sent at 12/09/2013  3:08 PM EST ----- Need to repeat thyroid panel.  He had solumedrol and epi day before labs due to allergic rxn.

## 2013-12-11 ENCOUNTER — Other Ambulatory Visit (INDEPENDENT_AMBULATORY_CARE_PROVIDER_SITE_OTHER): Payer: BC Managed Care – PPO

## 2013-12-11 DIAGNOSIS — R946 Abnormal results of thyroid function studies: Secondary | ICD-10-CM

## 2013-12-11 DIAGNOSIS — R7989 Other specified abnormal findings of blood chemistry: Secondary | ICD-10-CM

## 2013-12-11 NOTE — Progress Notes (Signed)
Lab only 

## 2013-12-11 NOTE — Progress Notes (Signed)
Labs only

## 2013-12-12 LAB — THYROID PANEL WITH TSH
Free Thyroxine Index: 5.4 — ABNORMAL HIGH (ref 1.2–4.9)
T3 Uptake Ratio: 33 % (ref 24–39)
T4, Total: 16.5 ug/dL — ABNORMAL HIGH (ref 4.5–12.0)
TSH: 0.005 u[IU]/mL — ABNORMAL LOW (ref 0.450–4.500)

## 2013-12-20 ENCOUNTER — Emergency Department (HOSPITAL_COMMUNITY): Admission: EM | Admit: 2013-12-20 | Discharge: 2013-12-20 | Discharge status: Absent without leave | Payer: Self-pay

## 2013-12-21 ENCOUNTER — Ambulatory Visit (INDEPENDENT_AMBULATORY_CARE_PROVIDER_SITE_OTHER): Payer: BC Managed Care – PPO | Admitting: Family Medicine

## 2013-12-21 ENCOUNTER — Other Ambulatory Visit: Payer: Self-pay | Admitting: Family Medicine

## 2013-12-21 VITALS — BP 145/84 | HR 87 | Temp 99.2°F | Wt 197.2 lb

## 2013-12-21 DIAGNOSIS — Z1211 Encounter for screening for malignant neoplasm of colon: Secondary | ICD-10-CM

## 2013-12-21 DIAGNOSIS — T783XXA Angioneurotic edema, initial encounter: Secondary | ICD-10-CM

## 2013-12-21 DIAGNOSIS — E0590 Thyrotoxicosis, unspecified without thyrotoxic crisis or storm: Secondary | ICD-10-CM

## 2013-12-21 DIAGNOSIS — Z9109 Other allergy status, other than to drugs and biological substances: Secondary | ICD-10-CM

## 2013-12-21 DIAGNOSIS — Z91048 Other nonmedicinal substance allergy status: Secondary | ICD-10-CM

## 2013-12-21 DIAGNOSIS — T783XXD Angioneurotic edema, subsequent encounter: Secondary | ICD-10-CM

## 2013-12-21 MED ORDER — CETIRIZINE HCL 10 MG PO TABS: 10.0000 mg | ORAL_TABLET | Freq: Two times a day (BID) | ORAL | Status: DC

## 2013-12-21 MED ORDER — PREDNISONE 10 MG PO TABS: ORAL_TABLET | ORAL | Status: DC

## 2013-12-21 MED ORDER — SODIUM CHLORIDE 0.9 % IV SOLN: 125.0000 mg | Freq: Once | INTRAVENOUS | Status: DC

## 2013-12-21 MED ORDER — METHYLPREDNISOLONE SODIUM SUCC 125 MG IJ SOLR
125.0000 mg | Freq: Once | INTRAMUSCULAR | Status: AC
  Administered 2013-12-21: 125 mg via INTRAMUSCULAR

## 2013-12-21 MED ORDER — PROPYLTHIOURACIL 50 MG PO TABS
100.0000 mg | ORAL_TABLET | Freq: Three times a day (TID) | ORAL | Status: DC
Start: 2013-12-21 — End: 2014-03-15

## 2013-12-21 MED ORDER — MONTELUKAST SODIUM 10 MG PO TABS: 10.0000 mg | ORAL_TABLET | Freq: Every day | ORAL | Status: DC

## 2013-12-21 NOTE — Progress Notes (Signed)
   Subjective:    Patient ID: Robert [name], male    DOB: 01-07-1956, 58 y.o.   MRN: [id number]  HPI Patient has c/o rash on arms and getting recurrent bouts of angioedema and requiring steroid injections and antihistamines.  He has had recent thyroid labs which show hyperthyroid status.  He Has some discomfort in his lower neck and thyroid area.  He is due for colonoscopy and is requesting a referral to GI for colonoscopy.   Review of Systems  C/o neck discomfort. No chest pain, SOB, HA, dizziness, vision change, N/V, diarrhea, constipation, dysuria, urinary urgency or frequency, myalgias, arthralgias or rash.     Objective:   Physical Exam  Vital signs noted  Well developed well nourished male.  HEENT - Head atraumatic Normocephalic                Eyes - PERRLA, Conjuctiva - clear Sclera- Clear EOMI                Ears - EAC's Wnl TM's Wnl Gross Hearing WNL                Nose - Nares patent                 Throat - oropharanx wnl                Neck - Thyroid tender  Respiratory - Lungs CTA bilateral Cardiac - RRR S1 and S2 without murmur GI - Abdomen soft Nontender and bowel sounds active x 4 Extremities - No edema. Neuro - Grossly intact.      Assessment & Plan:  Hyperthyroidism - Plan: propylthiouracil (PTU) 50 MG tablet  Special screening for malignant neoplasms, colon - Plan: Ambulatory referral to Gastroenterology  Environmental allergies - Plan: montelukast (SINGULAIR) 10 MG tablet, methylPREDNISolone sodium succinate (SOLU-MEDROL) 130 mg in sodium chloride 0.9 % 50 mL IVPB, methylPREDNISolone sodium succinate (SOLU-MEDROL) 125 mg/2 mL injection 125 mg  Angioedema, initial encounter - Plan: methylPREDNISolone sodium succinate (SOLU-MEDROL) 130 mg in sodium chloride 0.9 % 50 mL IVPB, methylPREDNISolone sodium succinate (SOLU-MEDROL) 125 mg/2 mL injection 125 mg  Follow up with Allergist  Deatra CanterWilliam J Osiris Odriscoll FNP

## 2013-12-22 ENCOUNTER — Encounter: Payer: Self-pay | Admitting: Gastroenterology

## 2014-01-04 ENCOUNTER — Ambulatory Visit (INDEPENDENT_AMBULATORY_CARE_PROVIDER_SITE_OTHER): Payer: BC Managed Care – PPO | Admitting: Endocrinology

## 2014-01-04 ENCOUNTER — Encounter: Payer: Self-pay | Admitting: Endocrinology

## 2014-01-04 VITALS — BP 134/82 | HR 79 | Temp 98.7°F | Ht 70.0 in | Wt 200.0 lb

## 2014-01-04 DIAGNOSIS — E0590 Thyrotoxicosis, unspecified without thyrotoxic crisis or storm: Secondary | ICD-10-CM

## 2014-01-04 LAB — TSH: TSH: 0.13 u[IU]/mL — ABNORMAL LOW (ref 0.35–4.50)

## 2014-01-04 LAB — T4, FREE: FREE T4: 2.05 ng/dL — AB (ref 0.60–1.60)

## 2014-01-04 NOTE — Progress Notes (Signed)
Subjective:    Patient ID: Robert [name], male    DOB: 1955/12/27, 58 y.o.   MRN: [id number]  HPI Pt reports he was dx'ed with hyperthyroidism 1 month, on a routine physical.  he was rx'ed PTU.  He has never had XRT to the anterior neck, or thyroid surgery.  He has never had thyroid imaging.  He does not consume kelp or any other non-prescribed thyroid medication.  He has never been on amiodarone.  He has slight tremor of the hands, but no assoc fever.  No past medical history on file.  No past surgical history on file.  History   Social History  . Marital Status: Single    Spouse Name: N/A    Number of Children: N/A  . Years of Education: N/A   Occupational History  . Not on file.   Social History Main Topics  . Smoking status: Former Smoker -- 0.25 packs/day    Types: Cigarettes    Start date: 12/08/2000    Quit date: 12/09/2010  . Smokeless tobacco: Not on file  . Alcohol Use: No  . Drug Use: No  . Sexual Activity: Not on file   Other Topics Concern  . Not on file   Social History Narrative    Current Outpatient Prescriptions on File Prior to Visit  Medication Sig Dispense Refill  . cetirizine (ZYRTEC) 10 MG tablet Take 1 tablet (10 mg total) by mouth 2 (two) times daily. 60 tablet 11  . EPINEPHrine 0.3 mg/0.3 mL IJ SOAJ injection Inject 0.3 mLs (0.3 mg total) into the muscle once. 1 Device 0  . hydrOXYzine (ATARAX/VISTARIL) 25 MG tablet Take 1 tablet (25 mg total) by mouth every 6 (six) hours. 30 tablet 3  . montelukast (SINGULAIR) 10 MG tablet Take 1 tablet (10 mg total) by mouth at bedtime. 30 tablet 11  . propylthiouracil (PTU) 50 MG tablet Take 2 tablets (100 mg total) by mouth 3 (three) times daily. (Patient taking differently: Take 100 mg by mouth 2 (two) times daily. ) 90 tablet 3  . methylPREDNIsolone (MEDROL DOSPACK) 4 MG tablet follow package directions (Patient not taking: Reported on 12/21/2013) 21 tablet 0  . [DISCONTINUED] diphenhydrAMINE (BENADRYL) 25  MG tablet Take 25-50 mg by mouth daily as needed. For allergic reaction    . [DISCONTINUED] fexofenadine (ALLEGRA) 180 MG tablet 1 po qAM for hives and itching 10 tablet 0   Current Facility-Administered Medications on File Prior to Visit  Medication Dose Route Frequency Provider Last Rate Last Dose  . methylPREDNISolone sodium succinate (SOLU-MEDROL) 130 mg in sodium chloride 0.9 % 50 mL IVPB  130 mg Intravenous Once Deatra CanterWilliam J Oxford, FNP   130 mg at 12/21/13 1700    No Known Allergies  Family History  Problem Relation Age of Onset  . Thyroid disease Neg Hx     BP 134/82 mmHg  Pulse 79  Temp(Src) 98.7 F (37.1 C) (Oral)  Ht 5\' 10"  (1.778 m)  Wt 200 lb (90.719 kg)  BMI 28.70 kg/m2  SpO2 94%   Review of Systems denies weight loss, headache, hoarseness, double vision, palpitations, sob, diarrhea, polyuria, muscle weakness, excessive diaphoresis, numbness, anxiety, hypoglycemia, easy bruising, and rhinorrhea.      Objective:   Physical Exam VS: see vs page GEN: no distress HEAD: head: no deformity eyes: no periorbital swelling, no proptosis external nose and ears are normal mouth: no lesion seen NECK: thyroid is approx 3 times normal size (R>L), but no nodule. CHEST WALL:  no deformity LUNGS: clear to auscultation BREASTS:  No gynecomastia.   CV: reg rate and rhythm, no murmur.  ABD: abdomen is soft, nontender.  no hepatosplenomegaly.  not distended.  no hernia MUSCULOSKELETAL: muscle bulk and strength are grossly normal.  no obvious joint swelling.  gait is normal and steady EXTEMITIES: no deformity. no edema.   NEURO:  cn 2-12 grossly intact.   readily moves all 4's. sensation is intact to touch on the feet.  Moderate fine tremor of the hands.  SKIN:  Normal texture and temperature, but hands are slightly diaphoretic.  No rash or suspicious lesion is visible.   NODES:  None palpable at the neck.   PSYCH: alert, well-oriented.  Does not appear anxious nor  depressed.   Lab Results  Component Value Date   TSH 0.13* 01/04/2014   T4TOTAL 16.5* 12/11/2013   i have reviewed the following old records: Office notes: pt was eval for above sxs, and ref here.    Assessment & Plan:  Hyperthyroidism, new to me, uncertain etiology    Patient is advised the following: Patient Instructions  blood tests are being requested for you today.  We'll let you know about the results. Please come back for a follow-up appointment in 6 weeks. If you choose to take the radioactive iodine, it works like this: We would first check a thyroid "scan" (a special, but easy and painless type of thyroid x ray).  It works like this: you go to the x-ray department of the hospital to swallow a pill, which contains a miniscule amount of radiation.  You will not notice any symptoms from this.  You will go back to the x-ray department the next day, to lie down in front of a camera.  The results of this will be sent to me.   Based on the results, i hope to order for you a treatment pill of radioactive iodine.  Although it is a larger amount of radiation, you will again notice no symptoms from this.  The pill is gone from your body in a few days (during which you should stay away from other people), but takes several months to work.  Therefore, please return here approximately 6-8 weeks after the treatment.  This treatment has been available for many years, and the only known side-effect is an underactive thyroid.  It is possible that i would eventually prescribe for you a thyroid hormone pill, which is very inexpensive.  You don't have to worry about side-effects of this thyroid hormone pill, because it is the same molecule your thyroid makes.  addendum: Please reduce the thyroid medication to 2 pills, twice a day.

## 2014-01-04 NOTE — Patient Instructions (Addendum)
blood tests are being requested for you today.  We'll let you know about the results. Please come back for a follow-up appointment in 6 weeks. If you choose to take the radioactive iodine, it works like this: We would first check a thyroid "scan" (a special, but easy and painless type of thyroid x ray).  It works like this: you go to the x-ray department of the hospital to swallow a pill, which contains a miniscule amount of radiation.  You will not notice any symptoms from this.  You will go back to the x-ray department the next day, to lie down in front of a camera.  The results of this will be sent to me.   Based on the results, i hope to order for you a treatment pill of radioactive iodine.  Although it is a larger amount of radiation, you will again notice no symptoms from this.  The pill is gone from your body in a few days (during which you should stay away from other people), but takes several months to work.  Therefore, please return here approximately 6-8 weeks after the treatment.  This treatment has been available for many years, and the only known side-effect is an underactive thyroid.  It is possible that i would eventually prescribe for you a thyroid hormone pill, which is very inexpensive.  You don't have to worry about side-effects of this thyroid hormone pill, because it is the same molecule your thyroid makes.

## 2014-03-01 ENCOUNTER — Ambulatory Visit (AMBULATORY_SURGERY_CENTER): Payer: Self-pay | Admitting: *Deleted

## 2014-03-01 VITALS — Ht 72.0 in | Wt 213.0 lb

## 2014-03-01 DIAGNOSIS — Z1211 Encounter for screening for malignant neoplasm of colon: Secondary | ICD-10-CM

## 2014-03-01 MED ORDER — NA SULFATE-K SULFATE-MG SULF 17.5-3.13-1.6 GM/177ML PO SOLN
ORAL | Status: DC
Start: 2014-03-01 — End: 2014-03-15

## 2014-03-01 NOTE — Progress Notes (Signed)
Patient denies any allergies to eggs or soy. Patient denies any problems with anesthesia/sedation. Patient denies any oxygen use at home and does not take any diet/weight loss medications. EMMI education assisgned to patient on colonoscopy, this was explained and instructions given to patient. 

## 2014-03-12 ENCOUNTER — Telehealth: Payer: Self-pay | Admitting: Gastroenterology

## 2014-03-12 NOTE — Telephone Encounter (Signed)
Spoke with patient and he can't afford his prep. I spoke with Hilda LiasMarie the pre-visit nurse, and she will put a free prep for him out at the front desk.   He will come and get it today.

## 2014-03-15 ENCOUNTER — Telehealth: Payer: Self-pay | Admitting: *Deleted

## 2014-03-15 ENCOUNTER — Ambulatory Visit (AMBULATORY_SURGERY_CENTER): Payer: BLUE CROSS/BLUE SHIELD | Admitting: Gastroenterology

## 2014-03-15 ENCOUNTER — Encounter: Payer: Self-pay | Admitting: Gastroenterology

## 2014-03-15 VITALS — BP 122/99 | HR 73 | Temp 96.6°F | Resp 13 | Ht 70.0 in | Wt 200.0 lb

## 2014-03-15 DIAGNOSIS — Z1211 Encounter for screening for malignant neoplasm of colon: Secondary | ICD-10-CM

## 2014-03-15 DIAGNOSIS — E0590 Thyrotoxicosis, unspecified without thyrotoxic crisis or storm: Secondary | ICD-10-CM

## 2014-03-15 MED ORDER — SODIUM CHLORIDE 0.9 % IV SOLN: 500.0000 mL | INTRAVENOUS | Status: DC

## 2014-03-15 MED ORDER — PROPYLTHIOURACIL 50 MG PO TABS: 100.0000 mg | ORAL_TABLET | Freq: Two times a day (BID) | ORAL | Status: DC

## 2014-03-15 NOTE — Op Note (Signed)
[facility] [address] [place], [phone]   COLONOSCOPY PROCEDURE REPORT  PATIENT: Robert [name], Robert [name]  MR#: [id number] BIRTHDATE: January 13, 1956 , 58  yrs. old GENDER: male ENDOSCOPIST: Louis Meckelobert D Kaplan, MD REFERRED UX:[id number]:Donald Christell ConstantMoore, M.D. PROCEDURE DATE:  03/15/2014 PROCEDURE:   Colonoscopy, diagnostic First Screening Colonoscopy - Avg.  risk and is 50 yrs.  old or older - No.  Prior Negative Screening - Now for repeat screening. 10 or more years since last screening  History of Adenoma - Now for follow-up colonoscopy & has been > or = to 3 yrs.  N/A  Polyps Removed Today? No.  Recommend repeat exam, <10 yrs? No. ASA CLASS:   Class II INDICATIONS:average risk patient for colon cancer. MEDICATIONS: Monitored anesthesia care and Propofol 500 mg IV  DESCRIPTION OF PROCEDURE:   After the risks benefits and alternatives of the procedure were thoroughly explained, informed consent was obtained.  The digital rectal exam revealed no abnormalities of the rectum.   The LB [id number]  endoscope was introduced through the anus and advanced to the cecum, which was identified by both the appendix and ileocecal valve. No adverse events experienced.   The quality of the prep was excellent using Suprep  The instrument was then slowly withdrawn as the colon was fully examined.      COLON FINDINGS: A normal appearing cecum, ileocecal valve, and appendiceal orifice were identified.  The ascending, transverse, descending, sigmoid colon, and rectum appeared unremarkable. the colon was extremely redundant. Retroflexed views revealed no abnormalities. The time to cecum=13 minutes 31 seconds.  Withdrawal time=15 minutes 04 seconds.  The scope was withdrawn and the procedure completed. COMPLICATIONS: There were no immediate complications.  ENDOSCOPIC IMPRESSION: Normal colonoscopy (redundant colon is (  RECOMMENDATIONS: Continue current colorectal screening recommendations for  "routine risk" patients with a repeat colonoscopy in 10 years.  eSigned:  Louis Meckelobert D Kaplan, MD 03/15/2014 10:22 AM   cc:

## 2014-03-15 NOTE — Telephone Encounter (Signed)
done

## 2014-03-15 NOTE — Progress Notes (Signed)
pROCEDURE ENDS, TO RECOVERY, REPORT GIVEN AND vss 

## 2014-03-15 NOTE — Patient Instructions (Signed)
Normal colonoscopy today! Repeat colonoscopy in 10 years.  Call us with any questions or concerns. Thank you!  YOU HAD AN ENDOSCOPIC PROCEDURE TODAY AT THE [facility]: Refer to the procedure report that was given to you for any specific questions about what was found during the examination.  If the procedure report does not answer your questions, please call your gastroenterologist to clarify.  If you requested that your care partner not be given the details of your procedure findings, then the procedure report has been included in a sealed envelope for you to review at your convenience later.  YOU SHOULD EXPECT: Some feelings of bloating in the abdomen. Passage of more gas than usual.  Walking can help get rid of the air that was put into your GI tract during the procedure and reduce the bloating. If you had a lower endoscopy (such as a colonoscopy or flexible sigmoidoscopy) you may notice spotting of blood in your stool or on the toilet paper. If you underwent a bowel prep for your procedure, then you may not have a normal bowel movement for a few days.  DIET: Your first meal following the procedure should be a light meal and then it is ok to progress to your normal diet.  A half-sandwich or bowl of soup is an example of a good first meal.  Heavy or fried foods are harder to digest and may make you feel nauseous or bloated.  Likewise meals heavy in dairy and vegetables can cause extra gas to form and this can also increase the bloating.  Drink plenty of fluids but you should avoid alcoholic beverages for 24 hours.  ACTIVITY: Your care partner should take you home directly after the procedure.  You should plan to take it easy, moving slowly for the rest of the day.  You can resume normal activity the day after the procedure however you should NOT DRIVE or use heavy machinery for 24 hours (because of the sedation medicines used during the test).    SYMPTOMS TO REPORT IMMEDIATELY: A  gastroenterologist can be reached at any hour.  During normal business hours, 8:30 AM to 5:00 PM Monday through Friday, call [phone].  After hours and on weekends, please call the GI answering service at [phone] who will take a message and have the physician on call contact you.   Following lower endoscopy (colonoscopy or flexible sigmoidoscopy):  Excessive amounts of blood in the stool  Significant tenderness or worsening of abdominal pains  Swelling of the abdomen that is new, acute  Fever of 100F or higher  Following upper endoscopy (EGD)  Vomiting of blood or coffee ground material  New chest pain or pain under the shoulder blades  Painful or persistently difficult swallowing  New shortness of breath  Fever of 100F or higher  Black, tarry-looking stools  FOLLOW UP: If any biopsies were taken you will be contacted by phone or by letter within the next 1-3 weeks.  Call your gastroenterologist if you have not heard about the biopsies in 3 weeks.  Our staff will call the home number listed on your records the next business day following your procedure to check on you and address any questions or concerns that you may have at that time regarding the information given to you following your procedure. This is a courtesy call and so if there is no answer at the home number and we have not heard from you through the emergency physician on call, we  will assume that you have returned to your regular daily activities without incident.  SIGNATURES/CONFIDENTIALITY: You and/or your care partner have signed paperwork which will be entered into your electronic medical record.  These signatures attest to the fact that that the information above on your After Visit Summary has been reviewed and is understood.  Full responsibility of the confidentiality of this discharge information lies with you and/or your care-partner.

## 2014-03-16 ENCOUNTER — Telehealth: Payer: Self-pay | Admitting: *Deleted

## 2014-03-16 NOTE — Telephone Encounter (Signed)
  Follow up Call-  Call back number 03/15/2014  Post procedure Call Back phone  # [phone]  Permission to leave phone message Yes     Patient questions:  Do you have a fever, pain , or abdominal swelling? No. Pain Score  0 *  Have you tolerated food without any problems? Yes.    Have you been able to return to your normal activities? Yes.    Do you have any questions about your discharge instructions: Diet   No. Medications  No. Follow up visit  No.  Do you have questions or concerns about your Care? No.  Actions: * If pain score is 4 or above: No action needed, pain <4.

## 2014-03-18 ENCOUNTER — Ambulatory Visit (INDEPENDENT_AMBULATORY_CARE_PROVIDER_SITE_OTHER): Payer: BLUE CROSS/BLUE SHIELD | Admitting: Family Medicine

## 2014-03-18 ENCOUNTER — Encounter: Payer: Self-pay | Admitting: Family Medicine

## 2014-03-18 DIAGNOSIS — E0590 Thyrotoxicosis, unspecified without thyrotoxic crisis or storm: Secondary | ICD-10-CM

## 2014-03-18 NOTE — Progress Notes (Signed)
   Subjective:    Patient ID: Robert BarterLarry [name], male    DOB: 13-Oct-1955, 59 y.o.   MRN: [id number]  HPI here to follow-up prostate exam and has questions about thyroid function. Apparently he is hyperthyroid and was seen by [place]in[place] in [place] who put him on propylthiouracil and he is to follow-up with that endocrinologist here in the next few weeks.  Regarding prostate he is not having any symptoms areas his PSA on exam back in November was 1.0 which is normal. There is no family history of prostate cancer or other disease.    Review of Systems  HENT: Positive for sore throat.   Respiratory: Positive for cough.    Patient Active Problem List   Diagnosis Date Noted  . Hyperthyroidism 01/04/2014   Outpatient Encounter Prescriptions as of 03/18/2014  Medication Sig  . acetaminophen (TYLENOL) 500 MG tablet Take 500 mg by mouth every 6 (six) hours as needed.  . Benzocaine-Glycerin-DM (CEPACOL DUAL RELIEF PO) Take 1 each by mouth as needed.  . hydrOXYzine (ATARAX/VISTARIL) 25 MG tablet Take 1 tablet (25 mg total) by mouth every 6 (six) hours.  . Naproxen Sodium 220 MG CAPS Take 1 capsule by mouth as needed.  Marland Kitchen. Phenyleph-Doxylamine-DM-APAP (ALKA SELTZER PLUS PO) Take 1 capsule by mouth as needed.  . propylthiouracil (PTU) 50 MG tablet Take 2 tablets (100 mg total) by mouth 2 (two) times daily.      Objective:   Physical Exam  Neck: Normal range of motion. Neck supple.  Genitourinary: Prostate normal.          Assessment & Plan:  Normal exam, especially prostate  Frederica KusterStephen M Miller MD

## 2014-03-23 HISTORY — PX: HERNIA REPAIR: SHX51

## 2014-04-13 ENCOUNTER — Ambulatory Visit (INDEPENDENT_AMBULATORY_CARE_PROVIDER_SITE_OTHER): Payer: BLUE CROSS/BLUE SHIELD

## 2014-04-13 ENCOUNTER — Ambulatory Visit (INDEPENDENT_AMBULATORY_CARE_PROVIDER_SITE_OTHER): Payer: BLUE CROSS/BLUE SHIELD | Admitting: Family Medicine

## 2014-04-13 ENCOUNTER — Encounter: Payer: Self-pay | Admitting: Family Medicine

## 2014-04-13 VITALS — BP 127/79 | HR 81 | Temp 97.3°F | Ht 70.0 in | Wt 213.0 lb

## 2014-04-13 DIAGNOSIS — K429 Umbilical hernia without obstruction or gangrene: Secondary | ICD-10-CM | POA: Diagnosis not present

## 2014-04-13 DIAGNOSIS — R0989 Other specified symptoms and signs involving the circulatory and respiratory systems: Secondary | ICD-10-CM | POA: Diagnosis not present

## 2014-04-13 DIAGNOSIS — R059 Cough, unspecified: Secondary | ICD-10-CM

## 2014-04-13 DIAGNOSIS — J069 Acute upper respiratory infection, unspecified: Secondary | ICD-10-CM

## 2014-04-13 DIAGNOSIS — I700 Atherosclerosis of aorta: Secondary | ICD-10-CM | POA: Insufficient documentation

## 2014-04-13 DIAGNOSIS — R05 Cough: Secondary | ICD-10-CM | POA: Diagnosis not present

## 2014-04-13 DIAGNOSIS — J029 Acute pharyngitis, unspecified: Secondary | ICD-10-CM

## 2014-04-13 LAB — POCT RAPID STREP A (OFFICE): Rapid Strep A Screen: NEGATIVE

## 2014-04-13 MED ORDER — AZITHROMYCIN 250 MG PO TABS: ORAL_TABLET | ORAL | Status: DC

## 2014-04-13 NOTE — Patient Instructions (Addendum)
Drink plenty of fluids Take antibiotic as directed Take Mucinex maximum strength, blue and white over-the-counter, 1 twice daily with a large glass of water for cough and congestion Use nasal saline frequently during the day Use DEBROX - for ears.

## 2014-04-13 NOTE — Progress Notes (Signed)
Subjective:    Patient ID: Robert [name], male    DOB: Jul 25, 1955, 59 y.o.   MRN: [id number]  HPI Patient here today for cough, congestion, and rib soreness. This cold started about 1 week ago. The patient has both head and chest congestion and has trouble breathing especially through his nose. He has some soreness in the right rib area and has not had any fever. The sputum is clear colored. He has not had a chest x-ray and several years. He also has a sore throat.        Patient Active Problem List   Diagnosis Date Noted  . Hyperthyroidism 01/04/2014   Outpatient Encounter Prescriptions as of 04/13/2014  Medication Sig  . propylthiouracil (PTU) 50 MG tablet Take 2 tablets (100 mg total) by mouth 2 (two) times daily.  . [DISCONTINUED] acetaminophen (TYLENOL) 500 MG tablet Take 500 mg by mouth every 6 (six) hours as needed.  . [DISCONTINUED] Benzocaine-Glycerin-DM (CEPACOL DUAL RELIEF PO) Take 1 each by mouth as needed.  . [DISCONTINUED] hydrOXYzine (ATARAX/VISTARIL) 25 MG tablet Take 1 tablet (25 mg total) by mouth every 6 (six) hours.  . [DISCONTINUED] Naproxen Sodium 220 MG CAPS Take 1 capsule by mouth as needed.  . [DISCONTINUED] Phenyleph-Doxylamine-DM-APAP (ALKA SELTZER PLUS PO) Take 1 capsule by mouth as needed.    Review of Systems  Constitutional: Negative for fever.  HENT: Positive for congestion, postnasal drip and sore throat.   Eyes: Negative.   Respiratory: Positive for cough.   Cardiovascular: Negative.   Gastrointestinal: Negative.   Endocrine: Negative.   Genitourinary: Negative.   Musculoskeletal: Positive for myalgias (chest).  Skin: Negative.   Allergic/Immunologic: Negative.   Neurological: Negative.   Hematological: Negative.   Psychiatric/Behavioral: Negative.        Objective:   Physical Exam  Constitutional: He is oriented to person, place, and time. He appears well-developed and well-nourished.  HENT:  Head: Normocephalic and atraumatic.    Mouth/Throat: Oropharynx is clear and moist. No oropharyngeal exudate.  He has bilateral ears cerumen and nasal congestion with turbinate swelling  Eyes: Conjunctivae and EOM are normal. Pupils are equal, round, and reactive to light. Right eye exhibits no discharge. Left eye exhibits no discharge. No scleral icterus.  Neck: Normal range of motion. Neck supple. No thyromegaly present.  There is anterior cervical tenderness without adenopathy being palpated  Cardiovascular: Normal rate, regular rhythm and normal heart sounds.  Exam reveals no gallop and no friction rub.   No murmur heard. Pulmonary/Chest: Effort normal and breath sounds normal. No respiratory distress. He has no wheezes. He has no rales. He exhibits no tenderness.  Bronchial congestion with coughing  Abdominal: Soft. Bowel sounds are normal. He exhibits no mass. There is no tenderness. There is no rebound and no guarding.  Umbilical hernia  Musculoskeletal: Normal range of motion. He exhibits no edema.  Lymphadenopathy:    He has no cervical adenopathy.  Neurological: He is alert and oriented to person, place, and time.  Skin: Skin is warm and dry. No rash noted.  Psychiatric: He has a normal mood and affect. His behavior is normal. Judgment and thought content normal.  Nursing note and vitals reviewed.  BP 127/79 mmHg  Pulse 81  Temp(Src) 97.3 F (36.3 C) (Oral)  Ht  (1.778 m)  Wt 213 lb (96.616 kg)  BMI 30.56 kg/m2  Results for orders placed or performed in visit on 04/13/14  POCT rapid strep A  Result Value Ref Range  Rapid Strep A Screen Negative Negative   [facility] reading (PRIMARY) by  Dr.Moore-chest x-ray--  no active disease. Due to changes in spine. Thoracic atherosclerosis.--The patient was informed of this result.                                      Assessment & Plan:  1. Sore throat -The rapid strep test was negative - POCT rapid strep A  2. Cough -Patient would be asked to use Mucinex,  blue and white, maximum strength over-the-counter and take one twice daily with a large glass of water to help cough and congestion - DG Chest 2 View; Future  3. Chest congestion -Mucinex plain, maximum strength 1 twice daily - DG Chest 2 View; Future - azithromycin (ZITHROMAX) 250 MG tablet; 2 the first day then one daily for infection until completed  Dispense: 6 tablet; Refill: 0  4. URI (upper respiratory infection) -Use nasal saline frequently through the day in each nostril - azithromycin (ZITHROMAX) 250 MG tablet; 2 the first day then one daily for infection until completed  Dispense: 6 tablet; Refill: 0  5. Umbilical hernia without obstruction and without gangrene -We will do a referral but for this umbilical hernia due to the fact the patient has pain with the hernia periodically  Meds ordered this encounter  Medications  . azithromycin (ZITHROMAX) 250 MG tablet    Sig: 2 the first day then one daily for infection until completed    Dispense:  6 tablet    Refill:  0   Patient Instructions  Drink plenty of fluids Take antibiotic as directed Take Mucinex maximum strength, blue and white over-the-counter, 1 twice daily with a large glass of water for cough and congestion Use nasal saline frequently during the day   Nyra Capeson W. Moore MD

## 2014-04-14 ENCOUNTER — Telehealth: Payer: Self-pay | Admitting: Family Medicine

## 2014-04-27 ENCOUNTER — Ambulatory Visit (INDEPENDENT_AMBULATORY_CARE_PROVIDER_SITE_OTHER): Payer: BLUE CROSS/BLUE SHIELD | Admitting: Endocrinology

## 2014-04-27 ENCOUNTER — Encounter: Payer: Self-pay | Admitting: Endocrinology

## 2014-04-27 VITALS — BP 146/94 | HR 72 | Temp 98.5°F | Ht 70.0 in | Wt 214.0 lb

## 2014-04-27 DIAGNOSIS — E0590 Thyrotoxicosis, unspecified without thyrotoxic crisis or storm: Secondary | ICD-10-CM

## 2014-04-27 NOTE — Patient Instructions (Addendum)
blood tests are requested for you today.  We'll let you know about the results.   if ever you have fever while taking methimazole, stop it and call us, because of the risk of a rare side-effect.   Please come back for a follow-up appointment in 3 months.

## 2014-04-27 NOTE — Progress Notes (Signed)
   Subjective:    Patient ID: Robert BarterLarry [name], male    DOB: 07-06-1955, 59 y.o.   MRN: [id number]  HPI Pt returns for f/u of hyperthyroidism (dx'ed late 2015, on a routine physical; he was rx'ed tapazole; he has never had thyroid imaging; he wants to continue tapazole, at least for now).  pt states he feels well in general.  Pt says he never misses the medication.    Past Medical History  Diagnosis Date  . Thyroid disease     Past Surgical History  Procedure Laterality Date  . No past surgeries    . [place]    History   Social History  . Marital Status: Single    Spouse Name: N/A  . Number of Children: N/A  . Years of Education: N/A   Occupational History  . Not on file.   Social History Main Topics  . Smoking status: Former Smoker -- 0.25 packs/day    Types: Cigarettes    Start date: 12/08/2000    Quit date: 12/09/2010  . Smokeless tobacco: Never Used  . Alcohol Use: No  . Drug Use: No  . Sexual Activity: Not on file   Other Topics Concern  . Not on file   Social History Narrative    Current Outpatient Prescriptions on File Prior to Visit  Medication Sig Dispense Refill  . [DISCONTINUED] diphenhydrAMINE (BENADRYL) 25 MG tablet Take 25-50 mg by mouth daily as needed. For allergic reaction    . [DISCONTINUED] fexofenadine (ALLEGRA) 180 MG tablet 1 po qAM for hives and itching 10 tablet 0   No current facility-administered medications on file prior to visit.    No Known Allergies  Family History  Problem Relation Age of Onset  . Thyroid disease Neg Hx   . Colon cancer Neg Hx     BP 146/94 mmHg  Pulse 72  Temp(Src) 98.5 F (36.9 C) (Oral)  Ht 5\' 10"  (1.778 m)  Wt 214 lb (97.07 kg)  BMI 30.71 kg/m2  SpO2 94%    Review of Systems Denies fever and weight change.    Objective:   Physical Exam VITAL SIGNS:  See vs page.  GENERAL: no distress. NECK: thyroid is approx 3-5 times normal size (R>>L), but no nodule.      Lab Results  Component Value Date   TSH 0.09* 04/27/2014   T4TOTAL 16.5* 12/11/2013       Assessment & Plan:  Hyperthyroidism: worse despite rx HTN: prob situational: we'll follow  Patient is advised the following: Patient Instructions  blood tests are requested for you today.  We'll let you know about the results.   if ever you have fever while taking methimazole, stop it and call us, because of the risk of a rare side-effect.   Please come back for a follow-up appointment in 3 months.    addendum: increase the thyroid pill, to 3 pills, bid. i have sent a prescription to your pharmacy

## 2014-04-28 LAB — TSH: TSH: 0.09 u[IU]/mL — ABNORMAL LOW (ref 0.35–4.50)

## 2014-04-28 LAB — T4, FREE: Free T4: 1.16 ng/dL (ref 0.60–1.60)

## 2014-04-28 MED ORDER — PROPYLTHIOURACIL 50 MG PO TABS: ORAL_TABLET | ORAL | Status: DC

## 2014-05-03 ENCOUNTER — Ambulatory Visit: Payer: Self-pay | Admitting: Surgery

## 2014-05-03 NOTE — H&P (Signed)
History of Present Illness Robert [name](Robert [name]; 05/03/2014 10:47 AM) Patient words: hernia.  The patient is a 59 year old male who presents with an umbilical hernia. 59 yo male presents with 16 year history of an enlarging umbilical hernia. This has become larger and more uncomfortable. It is not reducible. The patient reports no obstructive symptoms. 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 He is now referred by Dr. Rudi Heaponald [name] for evaluation for repair of umbilical hernia. Other Problems (Robert Eversole, LPN; 4/09/81194/11/2014 14:7810:29 AM) Thyroid Disease  Past Surgical History (Robert Eversole, LPN; 2/95/62134/11/2014 08:6510:29 AM) No pertinent past surgical history  Diagnostic Studies History (Robert Eversole, LPN; 7/84/69624/11/2014 95:2810:29 AM) Colonoscopy within last year  Allergies (Robert Eversole, LPN; 4/13/24404/11/2014 10:2710:29 AM) No Known Drug Allergies 05/03/2014  Medication History (Robert Eversole, LPN; 2/53/66444/11/2014 03:4710:32 AM) Propylthiouracil (50MG  Tablet, Oral) Active.  Social History (Robert Eversole, LPN; 4/25/95634/11/2014 87:5610:29 AM) Alcohol use Remotely quit alcohol use. Caffeine use Carbonated beverages, Tea. Illicit drug use Remotely quit drug use. Tobacco use Former smoker.  Family History Robert [name](Robert Eversole, LPN; 4/33/29514/11/2014 88:4110:29 AM) Diabetes Mellitus Mother. Hypertension Mother.     Review of Systems (Robert Eversole LPN; 6/60/63014/11/2014 60:1010:29 AM) General Not Present- Appetite Loss, Chills, Fatigue, Fever, Night Sweats, Weight Gain and Weight Loss. Skin Not Present- Change in Wart/Mole, Dryness, Hives, Jaundice, New Lesions, Non-Healing Wounds, Rash and Ulcer. HEENT Not Present- Earache, Hearing Loss, Hoarseness, Nose Bleed, Oral Ulcers, Ringing in the Ears, Seasonal Allergies, Sinus Pain, Sore Throat, Visual Disturbances, Wears glasses/contact lenses and Yellow Eyes. Respiratory Not Present- Bloody sputum, Chronic Cough, Difficulty Breathing, Snoring and Wheezing. Breast Not Present- Breast  Mass, Breast Pain, Nipple Discharge and Skin Changes. Cardiovascular Not Present- Chest Pain, Difficulty Breathing Lying Down, Leg Cramps, Palpitations, Rapid Heart Rate, Shortness of Breath and Swelling of Extremities. Gastrointestinal Not Present- Abdominal Pain, Bloating, Bloody Stool, Change in Bowel Habits, Chronic diarrhea, Constipation, Difficulty Swallowing, Excessive gas, Gets full quickly at meals, Hemorrhoids, Indigestion, Nausea, Rectal Pain and Vomiting. Male Genitourinary Not Present- Blood in Urine, Change in Urinary Stream, Frequency, Impotence, Nocturia, Painful Urination, Urgency and Urine Leakage. Musculoskeletal Not Present- Back Pain, Joint Pain, Joint Stiffness, Muscle Pain, Muscle Weakness and Swelling of Extremities. Neurological Not Present- Decreased Memory, Fainting, Headaches, Numbness, Seizures, Tingling, Tremor, Trouble walking and Weakness. Psychiatric Not Present- Anxiety, Bipolar, Change in Sleep Pattern, Depression, Fearful and Frequent crying. Endocrine Not Present- Cold Intolerance, Excessive Hunger, Hair Changes, Heat Intolerance, Hot flashes and New Diabetes. Hematology Not Present- Easy Bruising, Excessive bleeding, Gland problems, HIV and Persistent Infections.  Vitals (Robert Eversole LPN; 9/32/35574/11/2014 32:2010:29 AM) 05/03/2014 10:29 AM Weight: 212.2 lb Height: 72in Body Surface Area: 2.21 m Body Mass Index: 28.78 kg/m Temp.: 98.24F(Oral)  Pulse: 88 (Regular)  BP: 148/88 (Sitting, Left Arm, Standard)     Physical Exam Robert [name](Robert [name]; 05/03/2014 10:48 AM)  The physical exam findings are as follows: Note:WDWN in NAD HEENT: EOMI, sclera anicteric Neck: No masses, no thyromegaly Lungs: CTA bilaterally; normal respiratory effort CV: Regular rate and rhythm; no murmurs Abd: +bowel sounds, soft, protruding umbilical mass - mildly tender; not reducible Ext: Well-perfused; no edema Skin: Warm, dry; no sign of jaundice    Assessment & Plan  Robert [name](Robert [name]; 05/03/2014 10:46 AM)  UMBILICAL HERNIA WITHOUT OBSTRUCTION AND WITHOUT GANGRENE (553.1  K42.9)  Current Plans Schedule for Surgery - umbilical hernia repair with mesh. The surgical procedure has been discussed with the patient. Potential  risks, benefits, alternative treatments, and expected outcomes have been explained. All of the patient's questions at this time have been answered. The likelihood of reaching the patient's treatment goal is good. The patient understand the proposed surgical procedure and wishes to proceed.  Robert [name]. Robert [name], [name], [facility]  General/ Trauma Surgery  05/03/2014 10:49 AM

## 2014-09-30 ENCOUNTER — Ambulatory Visit (INDEPENDENT_AMBULATORY_CARE_PROVIDER_SITE_OTHER): Payer: BLUE CROSS/BLUE SHIELD | Admitting: Endocrinology

## 2014-09-30 ENCOUNTER — Encounter: Payer: Self-pay | Admitting: Endocrinology

## 2014-09-30 VITALS — BP 137/78 | HR 67 | Temp 97.6°F | Ht 70.0 in | Wt 207.0 lb

## 2014-09-30 DIAGNOSIS — E0590 Thyrotoxicosis, unspecified without thyrotoxic crisis or storm: Secondary | ICD-10-CM

## 2014-09-30 NOTE — Progress Notes (Signed)
Subjective:    Patient ID: Robert [name], male    DOB: [birth year], 59 y.o.   MRN: [id number]  HPI Pt returns for f/u of hyperthyroidism (dx'ed late 2015, on a routine physical; he was rx'ed tapazole; he has never had thyroid imaging; he wants to continue tapazole, at least for now).  pt states he feels well in general.  He wants to pursue I-131 rx, as he has met his deductible for this year.  Past Medical History  Diagnosis Date  . Thyroid disease     Past Surgical History  Procedure Laterality Date  . No past surgeries    . Colonoscopy  1980's    in [facility]    Social History   Social History  . Marital Status: Single    Spouse Name: N/A  . Number of Children: N/A  . Years of Education: N/A   Occupational History  . Not on file.   Social History Main Topics  . Smoking status: Former Smoker -- 0.25 packs/day    Types: Cigarettes    Start date: 12/08/2000    Quit date: 12/09/2010  . Smokeless tobacco: Never Used  . Alcohol Use: No  . Drug Use: No  . Sexual Activity: Not on file   Other Topics Concern  . Not on file   Social History Narrative    Current Outpatient Prescriptions on File Prior to Visit  Medication Sig Dispense Refill  . [DISCONTINUED] diphenhydrAMINE (BENADRYL) 25 MG tablet Take 25-50 mg by mouth daily as needed. For allergic reaction    . [DISCONTINUED] fexofenadine (ALLEGRA) 180 MG tablet 1 po qAM for hives and itching 10 tablet 0   No current facility-administered medications on file prior to visit.    No Known Allergies  Family History  Problem Relation Age of Onset  . Thyroid disease Neg Hx   . Colon cancer Neg Hx     BP 137/78 mmHg  Pulse 67  Temp(Src) 97.6 F (36.4 C) (Oral)  Ht '5\' 10"'  (1.778 m)  Wt 207 lb (93.895 kg)  BMI 29.70 kg/m2  SpO2 97%  Review of Systems Denies fever    Objective:   Physical Exam VITAL SIGNS:  See vs page.   GENERAL: no distress.  NECK: thyroid is approx 3-5 times normal size (R>>L), but  no nodule.       Assessment & Plan:  Hyperthyroidism: he wants to take I-131 rx  Patient is advised the following: Patient Instructions  Please stop taking the propylthiouracil pill. Please do thyroid blood tests, here, in 2 weeks.  If the thyroid is overactive, we would then check a thyroid "scan" (a special, but easy and painless type of thyroid x ray).  It works like this: you go to the x-ray department of the hospital to swallow a pill, which contains a miniscule amount of radiation.  You will not notice any symptoms from this.  You will go back to the x-ray department the next day, to lie down in front of a camera.  The results of this will be sent to me.   Based on the results, i hope to order for you a treatment pill of radioactive iodine.  Although it is a larger amount of radiation, you will again notice no symptoms from this.  The pill is gone from your body in a few days (during which you should stay away from other people), but takes several months to work.  Therefore, please return here approximately 6-8 weeks after the treatment.  This treatment has been available for many years, and the only known side-effect is an underactive thyroid.  It is possible that i would eventually prescribe for you a thyroid hormone pill, which is very inexpensive.  You don't have to worry about side-effects of this thyroid hormone pill, because it is the same molecule your thyroid makes.

## 2014-09-30 NOTE — Patient Instructions (Addendum)
Please stop taking the propylthiouracil pill. Please do thyroid blood tests, here, in 2 weeks.  If the thyroid is overactive, we would then check a thyroid "scan" (a special, but easy and painless type of thyroid x ray).  It works like this: you go to the x-ray department of the hospital to swallow a pill, which contains a miniscule amount of radiation.  You will not notice any symptoms from this.  You will go back to the x-ray department the next day, to lie down in front of a camera.  The results of this will be sent to me.   Based on the results, i hope to order for you a treatment pill of radioactive iodine.  Although it is a larger amount of radiation, you will again notice no symptoms from this.  The pill is gone from your body in a few days (during which you should stay away from other people), but takes several months to work.  Therefore, please return here approximately 6-8 weeks after the treatment.  This treatment has been available for many years, and the only known side-effect is an underactive thyroid.  It is possible that i would eventually prescribe for you a thyroid hormone pill, which is very inexpensive.  You don't have to worry about side-effects of this thyroid hormone pill, because it is the same molecule your thyroid makes.

## 2014-10-14 ENCOUNTER — Other Ambulatory Visit: Payer: Self-pay | Admitting: Endocrinology

## 2014-10-14 ENCOUNTER — Other Ambulatory Visit (INDEPENDENT_AMBULATORY_CARE_PROVIDER_SITE_OTHER): Payer: BLUE CROSS/BLUE SHIELD

## 2014-10-14 DIAGNOSIS — E0590 Thyrotoxicosis, unspecified without thyrotoxic crisis or storm: Secondary | ICD-10-CM

## 2014-10-14 LAB — TSH: TSH: 0.28 u[IU]/mL — ABNORMAL LOW (ref 0.35–4.50)

## 2014-10-14 LAB — T4, FREE: Free T4: 2.18 ng/dL — ABNORMAL HIGH (ref 0.60–1.60)

## 2014-10-25 ENCOUNTER — Encounter (HOSPITAL_COMMUNITY)
Admission: RE | Admit: 2014-10-25 | Discharge: 2014-10-25 | Disposition: A | Discharge status: Home / Self Care | Payer: BLUE CROSS/BLUE SHIELD | Source: Ambulatory Visit | Attending: Endocrinology | Admitting: Endocrinology

## 2014-10-25 DIAGNOSIS — J029 Acute pharyngitis, unspecified: Secondary | ICD-10-CM | POA: Insufficient documentation

## 2014-10-25 DIAGNOSIS — R634 Abnormal weight loss: Secondary | ICD-10-CM | POA: Insufficient documentation

## 2014-10-25 DIAGNOSIS — E0590 Thyrotoxicosis, unspecified without thyrotoxic crisis or storm: Secondary | ICD-10-CM

## 2014-10-25 MED ORDER — SODIUM IODIDE I 131 CAPSULE
8.2000 | Freq: Once | INTRAVENOUS | Status: AC | PRN
  Administered 2014-10-25: 8.2 via ORAL

## 2014-10-26 ENCOUNTER — Encounter (HOSPITAL_COMMUNITY)
Admission: RE | Admit: 2014-10-26 | Discharge: 2014-10-26 | Disposition: A | Discharge status: Home / Self Care | Payer: BLUE CROSS/BLUE SHIELD | Source: Ambulatory Visit | Attending: Endocrinology | Admitting: Endocrinology

## 2014-10-26 ENCOUNTER — Other Ambulatory Visit: Payer: Self-pay | Admitting: Endocrinology

## 2014-10-26 DIAGNOSIS — E0590 Thyrotoxicosis, unspecified without thyrotoxic crisis or storm: Secondary | ICD-10-CM

## 2014-10-26 DIAGNOSIS — J029 Acute pharyngitis, unspecified: Secondary | ICD-10-CM | POA: Diagnosis not present

## 2014-10-26 DIAGNOSIS — R634 Abnormal weight loss: Secondary | ICD-10-CM | POA: Diagnosis not present

## 2014-10-26 MED ORDER — SODIUM PERTECHNETATE TC 99M INJECTION
10.0000 | Freq: Once | INTRAVENOUS | Status: AC | PRN
Start: 2014-10-26 — End: 2014-10-26
  Administered 2014-10-26: 10 via INTRAVENOUS

## 2014-10-27 ENCOUNTER — Telehealth: Payer: Self-pay | Admitting: Endocrinology

## 2014-10-27 NOTE — Telephone Encounter (Signed)
Patient is call for his results

## 2014-10-27 NOTE — Telephone Encounter (Signed)
I contacted the pt and advised of thyroid up taking scan from 10/4. Pt voiced understanding,.

## 2014-11-08 NOTE — Telephone Encounter (Signed)
error 

## 2014-11-11 ENCOUNTER — Ambulatory Visit (HOSPITAL_COMMUNITY): Payer: BLUE CROSS/BLUE SHIELD

## 2014-11-18 ENCOUNTER — Ambulatory Visit (INDEPENDENT_AMBULATORY_CARE_PROVIDER_SITE_OTHER): Payer: BLUE CROSS/BLUE SHIELD | Admitting: Family Medicine

## 2014-11-18 ENCOUNTER — Encounter: Payer: Self-pay | Admitting: Family Medicine

## 2014-11-18 VITALS — BP 118/71 | HR 85 | Temp 98.3°F | Ht 70.0 in | Wt 198.0 lb

## 2014-11-18 DIAGNOSIS — J209 Acute bronchitis, unspecified: Secondary | ICD-10-CM | POA: Diagnosis not present

## 2014-11-18 MED ORDER — HYDROCODONE-HOMATROPINE 5-1.5 MG/5ML PO SYRP: 5.0000 mL | ORAL_SOLUTION | Freq: Three times a day (TID) | ORAL | Status: DC | PRN

## 2014-11-18 MED ORDER — DOXYCYCLINE HYCLATE 100 MG PO TABS: 100.0000 mg | ORAL_TABLET | Freq: Two times a day (BID) | ORAL | Status: DC

## 2014-11-18 NOTE — Progress Notes (Signed)
   Subjective:    Patient ID: Robert BarterLarry [name], male    DOB: [birth year], 59 y.o.   MRN: [id number]  HPI 59 year old gentleman with a 4 day history of productive cough. Has been taking over-the-counter Mucinex. He denies fever or chills. The cough is productive and he has heard wheezing in his chest. He does not smoke.  Patient Active Problem List   Diagnosis Date Noted  . Thoracic aorta atherosclerosis (HCC) 04/13/2014  . Hyperthyroidism 01/04/2014   No outpatient encounter prescriptions on file as of 11/18/2014.   No facility-administered encounter medications on file as of 11/18/2014.      Review of Systems  Constitutional: Positive for fatigue.  HENT: Positive for congestion.   Respiratory: Positive for cough.   Cardiovascular: Negative.   Neurological: Negative.        Objective:   Physical Exam  Constitutional: He is oriented to person, place, and time. He appears well-developed and well-nourished.  HENT:  Head: Normocephalic.  There is tenderness in the frontal sinus area  Cardiovascular: Normal rate and regular rhythm.   Pulmonary/Chest: Effort normal. He has wheezes.  Neurological: He is alert and oriented to person, place, and time.          Assessment & Plan:  1. Acute bronchitis, unspecified organism Will treat with doxycycline 100 mg twice a day. Continue Mucinex. Drink plenty of fluids. Also, Hycodan to take at bedtime only so he can rest  Robert KusterStephen M Aliyana Dlugosz MD  Notice: Thisdictation was prepared with Dragon dictation along with smaller phrase technology. Any transcriptional errors that result from this process are unintentional and may not be corrected upon review

## 2014-11-19 ENCOUNTER — Ambulatory Visit (HOSPITAL_COMMUNITY)
Admission: RE | Admit: 2014-11-19 | Discharge: 2014-11-19 | Disposition: A | Discharge status: Home / Self Care | Payer: BLUE CROSS/BLUE SHIELD | Source: Ambulatory Visit | Attending: Endocrinology | Admitting: Endocrinology

## 2014-11-19 DIAGNOSIS — E0590 Thyrotoxicosis, unspecified without thyrotoxic crisis or storm: Secondary | ICD-10-CM | POA: Diagnosis not present

## 2014-11-19 MED ORDER — SODIUM IODIDE I 131 CAPSULE
15.0000 | Freq: Once | INTRAVENOUS | Status: AC | PRN
Start: 2014-11-19 — End: 2014-11-19
  Administered 2014-11-19: 16.08 via ORAL

## 2014-12-24 ENCOUNTER — Telehealth: Payer: Self-pay | Admitting: Endocrinology

## 2014-12-24 NOTE — Telephone Encounter (Signed)
Noted  

## 2014-12-24 NOTE — Telephone Encounter (Signed)
Scheduled for fu from RAI

## 2014-12-29 ENCOUNTER — Ambulatory Visit (INDEPENDENT_AMBULATORY_CARE_PROVIDER_SITE_OTHER): Payer: BLUE CROSS/BLUE SHIELD | Admitting: Endocrinology

## 2014-12-29 ENCOUNTER — Encounter: Payer: Self-pay | Admitting: Endocrinology

## 2014-12-29 VITALS — BP 142/87 | HR 80 | Temp 97.6°F | Ht 70.0 in | Wt 212.0 lb

## 2014-12-29 DIAGNOSIS — E0590 Thyrotoxicosis, unspecified without thyrotoxic crisis or storm: Secondary | ICD-10-CM

## 2014-12-29 DIAGNOSIS — R350 Frequency of micturition: Secondary | ICD-10-CM | POA: Insufficient documentation

## 2014-12-29 LAB — URINALYSIS, ROUTINE W REFLEX MICROSCOPIC
BILIRUBIN URINE: NEGATIVE
Hgb urine dipstick: NEGATIVE
Ketones, ur: NEGATIVE
Leukocytes, UA: NEGATIVE
Nitrite: NEGATIVE
PH: 6 (ref 5.0–8.0)
SPECIFIC GRAVITY, URINE: 1.025 (ref 1.000–1.030)
Total Protein, Urine: NEGATIVE
Urine Glucose: NEGATIVE
Urobilinogen, UA: 1 (ref 0.0–1.0)

## 2014-12-29 LAB — T4, FREE: Free T4: 3.73 ng/dL — ABNORMAL HIGH (ref 0.60–1.60)

## 2014-12-29 LAB — TSH: TSH: 0.12 u[IU]/mL — ABNORMAL LOW (ref 0.35–4.50)

## 2014-12-29 NOTE — Patient Instructions (Signed)
Blood and urine tests are requested for you today.  We'll let you know about the results.   °Please come back for a follow-up appointment in 6 weeks °

## 2014-12-29 NOTE — Progress Notes (Signed)
   Subjective:    Patient ID: Robert [name], male    DOB: April 15, 1955, 59 y.o.   MRN: [id number]  HPI Pt returns for f/u of hyperthyroidism (dx'ed late 2015, on a routine physical; after initial rx with tapazole, he took I-131 in late 2016).  pt states he feels well in general, except for fatigue.   He still has slight swelling at the anterior neck, but no assoc pain. Past Medical History  Diagnosis Date  . Thyroid disease     Past Surgical History  Procedure Laterality Date  . No past surgeries    . [facility]  [facility]    [facility]  . Hernia repair  03/2014    Umbilical    Social History   Social History  . Marital Status: Single    Spouse Name: N/A  . Number of Children: N/A  . Years of Education: N/A   Occupational History  . Not on file.   Social History Main Topics  . Smoking status: Former Smoker -- 0.25 packs/day    Types: Cigarettes    Start date: 12/08/2000    Quit date: 12/09/2010  . Smokeless tobacco: Never Used  . Alcohol Use: No  . Drug Use: No  . Sexual Activity: Not on file   Other Topics Concern  . Not on file   Social History Narrative    Current Outpatient Prescriptions on File Prior to Visit  Medication Sig Dispense Refill  . [DISCONTINUED] diphenhydrAMINE (BENADRYL) 25 MG tablet Take 25-50 mg by mouth daily as needed. For allergic reaction    . [DISCONTINUED] fexofenadine (ALLEGRA) 180 MG tablet 1 po qAM for hives and itching 10 tablet 0   No current facility-administered medications on file prior to visit.    No Known Allergies  Family History  Problem Relation Age of Onset  . Thyroid disease Neg Hx   . Colon cancer Neg Hx     BP 142/87 mmHg  Pulse 80  Temp(Src) 97.6 F (36.4 C) (Oral)  Ht 5\' 10"  (1.778 m)  Wt 212 lb (96.163 kg)  BMI 30.42 kg/m2  SpO2 97%  Review of Systems Denies fever.  He has frequent urination.      Objective:   Physical Exam VITAL SIGNS:  See vs page. GENERAL: no distress.  mouth: mucous  membranes are not dry. NECK: thyroid is approx 3-5 times normal size (R>>L), but no nodule.    Skin: not diaphoretic Neuro: no tremor.  Gait is normal and steady.     Lab Results  Component Value Date   TSH 0.12* 12/29/2014   T4TOTAL 16.5* 12/11/2013      Assessment & Plan:  hyperthyroidism: not better yet Urinary frequency, new, uncertain etiology.  Check UA today   Patient is advised the following: Patient Instructions  Blood and urine tests are requested for you today.  We'll let you know about the results. Please come back for a follow-up appointment in 6 weeks

## 2015-02-09 ENCOUNTER — Ambulatory Visit (INDEPENDENT_AMBULATORY_CARE_PROVIDER_SITE_OTHER): Payer: BLUE CROSS/BLUE SHIELD | Admitting: Endocrinology

## 2015-02-09 ENCOUNTER — Encounter: Payer: Self-pay | Admitting: Endocrinology

## 2015-02-09 VITALS — BP 136/82 | HR 62 | Temp 98.4°F | Ht 70.0 in | Wt 207.0 lb

## 2015-02-09 DIAGNOSIS — E0590 Thyrotoxicosis, unspecified without thyrotoxic crisis or storm: Secondary | ICD-10-CM | POA: Diagnosis not present

## 2015-02-09 LAB — TSH: TSH: 0.4 u[IU]/mL (ref 0.35–4.50)

## 2015-02-09 LAB — T4, FREE: Free T4: 0.51 ng/dL — ABNORMAL LOW (ref 0.60–1.60)

## 2015-02-09 MED ORDER — LEVOTHYROXINE SODIUM 100 MCG PO TABS: 100.0000 ug | ORAL_TABLET | Freq: Every day | ORAL | Status: DC

## 2015-02-09 NOTE — Patient Instructions (Addendum)
Blood tests are requested for you today.  We'll let you know about the results.  Please come back for a follow-up appointment in 1 month.  

## 2015-02-09 NOTE — Progress Notes (Signed)
   Subjective:    Patient ID: Robert [name], male    DOB: 1955-11-11, 60 y.o.   MRN: [id number]  HPI Pt returns for f/u of hyperthyroidism (dx'ed late 2015, on a routine physical; after initial rx with tapazole, he took I-131 in Oct of 2016).  pt states he feels well in general.  Past Medical History  Diagnosis Date  . Thyroid disease     Past Surgical History  Procedure Laterality Date  . No past surgeries    . [facility]  1980's    [facility]  . Hernia repair  03/2014    Umbilical    Social History   Social History  . Marital Status: Single    Spouse Name: N/A  . Number of Children: N/A  . Years of Education: N/A   Occupational History  . Not on file.   Social History Main Topics  . Smoking status: Former Smoker -- 0.25 packs/day    Types: Cigarettes    Start date: 12/08/2000    Quit date: 12/09/2010  . Smokeless tobacco: Never Used  . Alcohol Use: No  . Drug Use: No  . Sexual Activity: Not on file   Other Topics Concern  . Not on file   Social History Narrative    Current Outpatient Prescriptions on File Prior to Visit  Medication Sig Dispense Refill  . [DISCONTINUED] diphenhydrAMINE (BENADRYL) 25 MG tablet Take 25-50 mg by mouth daily as needed. For allergic reaction    . [DISCONTINUED] fexofenadine (ALLEGRA) 180 MG tablet 1 po qAM for hives and itching 10 tablet 0   No current facility-administered medications on file prior to visit.    No Known Allergies  Family History  Problem Relation Age of Onset  . Thyroid disease Neg Hx   . Colon cancer Neg Hx     BP 136/82 mmHg  Pulse 62  Temp(Src) 98.4 F (36.9 C) (Oral)  Ht  (1.778 m)  Wt 207 lb (93.895 kg)  BMI 29.70 kg/m2  SpO2 95%  Review of Systems Denies weight change    Objective:   Physical Exam VITAL SIGNS:  See vs page GENERAL: no distress NECK: thyroid is slightly enlarged (R>L), but no nodule.   Lab Results  Component Value Date   TSH 0.40 02/09/2015   T4TOTAL  16.5* 12/11/2013       Assessment & Plan:  Hyperthyroidism, resolved with I-131 rx.   Patient is advised the following: Patient Instructions  Blood tests are requested for you today.  We'll let you know about the results.  Please come back for a follow-up appointment in 1 month.   addendum: i have sent a prescription to your pharmacy, for synthroid

## 2015-03-11 ENCOUNTER — Encounter: Payer: Self-pay | Admitting: Endocrinology

## 2015-03-11 ENCOUNTER — Ambulatory Visit (INDEPENDENT_AMBULATORY_CARE_PROVIDER_SITE_OTHER): Payer: BLUE CROSS/BLUE SHIELD | Admitting: Endocrinology

## 2015-03-11 VITALS — BP 132/87 | HR 60 | Temp 97.8°F | Ht 70.0 in | Wt 201.0 lb

## 2015-03-11 DIAGNOSIS — E890 Postprocedural hypothyroidism: Secondary | ICD-10-CM | POA: Diagnosis not present

## 2015-03-11 LAB — TSH: TSH: 5.3 u[IU]/mL — AB (ref 0.35–4.50)

## 2015-03-11 LAB — T4, FREE: Free T4: 0.91 ng/dL (ref 0.60–1.60)

## 2015-03-11 NOTE — Patient Instructions (Addendum)
Blood tests are requested for you today.  We'll let you know about the results.   Please come back for a follow-up appointment in 6 weeks.  

## 2015-03-11 NOTE — Progress Notes (Signed)
   Subjective:    Patient ID: Robert [name], male    DOB: [birth year], 60 y.o.   MRN: [id number]  HPI Pt returns for f/u of hyperthyroidism (due to Grave's disease; dx'ed late 2015, on a routine physical; after initial rx with tapazole, he took I-131 in Oct of 2016; he started synthroid in Jan of 2017).  pt states he feels no different, and well in general.   Past Medical History  Diagnosis Date  . Thyroid disease     Past Surgical History  Procedure Laterality Date  . No past surgeries    . [facility]  1980's    [facility]  . Hernia repair  03/2014    Umbilical    Social History   Social History  . Marital Status: Single    Spouse Name: N/A  . Number of Children: N/A  . Years of Education: N/A   Occupational History  . Not on file.   Social History Main Topics  . Smoking status: Former Smoker -- 0.25 packs/day    Types: Cigarettes    Start date: 12/08/2000    Quit date: 12/09/2010  . Smokeless tobacco: Never Used  . Alcohol Use: No  . Drug Use: No  . Sexual Activity: Not on file   Other Topics Concern  . Not on file   Social History Narrative    Current Outpatient Prescriptions on File Prior to Visit  Medication Sig Dispense Refill  . levothyroxine (SYNTHROID, LEVOTHROID) 100 MCG tablet Take 1 tablet (100 mcg total) by mouth daily before breakfast. 30 tablet 2  . [DISCONTINUED] diphenhydrAMINE (BENADRYL) 25 MG tablet Take 25-50 mg by mouth daily as needed. For allergic reaction    . [DISCONTINUED] fexofenadine (ALLEGRA) 180 MG tablet 1 po qAM for hives and itching 10 tablet 0   No current facility-administered medications on file prior to visit.    No Known Allergies  Family History  Problem Relation Age of Onset  . Thyroid disease Neg Hx   . Colon cancer Neg Hx     BP 132/87 mmHg  Pulse 60  Temp(Src) 97.8 F (36.6 C) (Oral)  Ht  (1.778 m)  Wt 201 lb (91.173 kg)  BMI 28.84 kg/m2  SpO2 96%   Review of Systems No weight change      Objective:   Physical Exam VITAL SIGNS:  See vs page GENERAL: no distress NECK: thyroid is slightly enlarged (R>L), but no palpable nodule.    Lab Results  Component Value Date   TSH 5.30* 03/11/2015   T4TOTAL 16.5* 12/11/2013      Assessment & Plan:  Post-I-131 hypothyroidism.  He needs increased rx  Patient is advised the following: Patient Instructions  Blood tests are requested for you today.  We'll let you know about the results.  Please come back for a follow-up appointment in 6 weeks.  addendum: i have sent a prescription to your pharmacy, to increase synthroid.

## 2015-03-12 MED ORDER — LEVOTHYROXINE SODIUM 125 MCG PO TABS: 125.0000 ug | ORAL_TABLET | Freq: Every day | ORAL | Status: DC

## 2015-04-22 ENCOUNTER — Encounter: Payer: Self-pay | Admitting: Endocrinology

## 2015-04-22 ENCOUNTER — Ambulatory Visit (INDEPENDENT_AMBULATORY_CARE_PROVIDER_SITE_OTHER): Payer: BLUE CROSS/BLUE SHIELD | Admitting: Endocrinology

## 2015-04-22 VITALS — BP 128/87 | HR 64 | Temp 98.7°F | Ht 70.0 in | Wt 201.0 lb

## 2015-04-22 DIAGNOSIS — E890 Postprocedural hypothyroidism: Secondary | ICD-10-CM

## 2015-04-22 LAB — TSH: TSH: 12.33 u[IU]/mL — AB (ref 0.35–4.50)

## 2015-04-22 MED ORDER — LEVOTHYROXINE SODIUM 175 MCG PO TABS: 175.0000 ug | ORAL_TABLET | Freq: Every day | ORAL | Status: DC

## 2015-04-22 NOTE — Progress Notes (Signed)
   Subjective:    Patient ID: Robert BarterLarry [name], male    DOB: [birth year], 60 y.o.   MRN: [id number]  HPI Pt returns for f/u of hyperthyroidism (due to Grave's disease; dx'ed late 2015, on a routine physical; after initial rx with tapazole, he took I-131 in Oct of 2016; he started synthroid in Jan of 2017).  pt states he feels no different, and well in general.   Past Medical History  Diagnosis Date  . Thyroid disease     Past Surgical History  Procedure Laterality Date  . No past surgeries    . [place]    [place] [facility]  . Hernia repair  03/2014    Umbilical    Social History   Social History  . Marital Status: Single    Spouse Name: N/A  . Number of Children: N/A  . Years of Education: N/A   Occupational History  . Not on file.   Social History Main Topics  . Smoking status: Former Smoker -- 0.25 packs/day    Types: Cigarettes    Start date: 12/08/2000    Quit date: 12/09/2010  . Smokeless tobacco: Never Used  . Alcohol Use: No  . Drug Use: No  . Sexual Activity: Not on file   Other Topics Concern  . Not on file   Social History Narrative    Current Outpatient Prescriptions on File Prior to Visit  Medication Sig Dispense Refill  . levothyroxine (SYNTHROID, LEVOTHROID) 125 MCG tablet Take 1 tablet (125 mcg total) by mouth daily. 30 tablet 3  . [DISCONTINUED] diphenhydrAMINE (BENADRYL) 25 MG tablet Take 25-50 mg by mouth daily as needed. For allergic reaction    . [DISCONTINUED] fexofenadine (ALLEGRA) 180 MG tablet 1 po qAM for hives and itching 10 tablet 0   No current facility-administered medications on file prior to visit.    No Known Allergies  Family History  Problem Relation Age of Onset  . Thyroid disease Neg Hx   . Colon cancer Neg Hx     BP 128/87 mmHg  Pulse 64  Temp(Src) 98.7 F (37.1 C) (Oral)  Ht 5\' 10"  (1.778 m)  Wt 201 lb (91.173 kg)  BMI 28.84 kg/m2  SpO2 94%    Review of Systems No weight change    Objective:   Physical Exam VITAL SIGNS:  See vs page GENERAL: no distress NECK: There is no palpable thyroid enlargement.  No thyroid nodule is palpable.  No palpable lymphadenopathy at the anterior neck.  Lab Results  Component Value Date   TSH 12.33* 04/22/2015   T4TOTAL 16.5* 12/11/2013       Assessment & Plan:  Hypothyroidism: he needs increased rx.  Patient is advised the following: Patient Instructions  blood tests are requested for you today.  We'll let you know about the results.  Please come back for a follow-up appointment in 3 months.

## 2015-04-22 NOTE — Patient Instructions (Addendum)
blood tests are requested for you today.  We'll let you know about the results.   Please come back for a follow-up appointment in 3 months.   

## 2015-04-25 ENCOUNTER — Telehealth: Payer: Self-pay | Admitting: Endocrinology

## 2015-04-26 NOTE — Telephone Encounter (Signed)
error 

## 2015-07-04 ENCOUNTER — Encounter: Payer: Self-pay | Admitting: Family Medicine

## 2015-07-04 ENCOUNTER — Ambulatory Visit (INDEPENDENT_AMBULATORY_CARE_PROVIDER_SITE_OTHER): Payer: BLUE CROSS/BLUE SHIELD | Admitting: Family Medicine

## 2015-07-04 VITALS — BP 115/77 | HR 66 | Temp 96.7°F | Ht 70.0 in | Wt 199.0 lb

## 2015-07-04 DIAGNOSIS — E0590 Thyrotoxicosis, unspecified without thyrotoxic crisis or storm: Secondary | ICD-10-CM | POA: Diagnosis not present

## 2015-07-04 DIAGNOSIS — Z0000 Encounter for general adult medical examination without abnormal findings: Secondary | ICD-10-CM

## 2015-07-04 NOTE — Progress Notes (Signed)
Subjective:    Patient ID: Robert [name], male    DOB: May 11, 1955, 60 y.o.   MRN: [id number]  HPI Patient is here today for annual wellness exam and follow up of chronic medical problems which includes hyperthyroid.  Endocrinologist Daniel Nones is thyroid and patient prefers to have him follow-up after propylthiouracil treatment. He feels well otherwise but may have pulled a muscle in his left lateral chest area lifting some heavy boxes last week. Generally he is very healthy and has none of the usual chronic diseases such as hypertension diabetes hyperlipidemia, etc.   Patient Active Problem List   Diagnosis Date Noted  . Hypothyroidism following radioiodine therapy 03/11/2015  . Urinary frequency 12/29/2014  . Thoracic aorta atherosclerosis ([place]) 04/13/2014   Outpatient Encounter Prescriptions as of 07/04/2015  Medication Sig  . levothyroxine (SYNTHROID, LEVOTHROID) 175 MCG tablet Take 1 tablet (175 mcg total) by mouth daily before breakfast.   No facility-administered encounter medications on file as of 07/04/2015.      Review of Systems  Constitutional: Negative.   HENT: Negative.   Eyes: Negative.   Respiratory: Negative.   Cardiovascular: Negative.   Gastrointestinal: Negative.   Endocrine: Negative.   Genitourinary: Negative.   Musculoskeletal: Positive for arthralgias (left rib area pain).  Skin: Negative.   Allergic/Immunologic: Negative.   Neurological: Negative.   Hematological: Negative.   Psychiatric/Behavioral: Negative.        Objective:   Physical Exam  Constitutional: He is oriented to person, place, and time. He appears well-developed and well-nourished.  HENT:  Head: Normocephalic.  Right Ear: External ear normal.  Left Ear: External ear normal.  Nose: Nose normal.  Mouth/Throat: Oropharynx is clear and moist.  Eyes: Conjunctivae and EOM are normal. Pupils are equal, round, and reactive to light.  Neck: Normal range of motion. Neck supple.    Cardiovascular: Normal rate, regular rhythm, normal heart sounds and intact distal pulses.   Pulmonary/Chest: Effort normal and breath sounds normal.  Abdominal: Soft. Bowel sounds are normal.  Genitourinary: Prostate normal.  Musculoskeletal: Normal range of motion.  Neurological: He is alert and oriented to person, place, and time.  Skin: Skin is warm and dry.  Psychiatric: He has a normal mood and affect. His behavior is normal. Judgment and thought content normal.    BP 115/77 mmHg  Pulse 66  Temp(Src) 96.7 F (35.9 C) (Oral)  Ht 5' 10" (1.778 m)  Wt 199 lb (90.266 kg)  BMI 28.55 kg/m2        Assessment & Plan:  1. Annual physical exam Normal exam. We'll do screening lab work to reassess lipids blood sugar and PSA - CMP14+EGFR - Lipid panel - TSH - PSA, total and free  2. Hyperthyroidism Followed by endocrinology who is giving him thyroid replacement. TSH is still markedly elevated on 175 g per day - TSH  Wardell Honour MD

## 2015-07-04 NOTE — Patient Instructions (Signed)
Continue current medications. Continue good therapeutic lifestyle changes which include good diet and exercise. Fall precautions discussed with patient. If an FOBT was given today- please return it to our front desk. If you are over 62 years old - you may need Prevnar 13 or the adult Pneumonia vaccine.   After your visit with us today you will receive a survey in the mail or online from [employer] regarding your care with us. Please take a moment to fill this out. Your feedback is very important to us as you can help us better understand your patient needs as well as improve your experience and satisfaction. WE CARE ABOUT YOU!!!    

## 2015-07-05 LAB — CMP14+EGFR
A/G RATIO: 1.3 (ref 1.2–2.2)
ALBUMIN: 4.5 g/dL (ref 3.6–4.8)
ALK PHOS: 73 IU/L (ref 39–117)
ALT: 16 IU/L (ref 0–44)
AST: 23 IU/L (ref 0–40)
BILIRUBIN TOTAL: 0.4 mg/dL (ref 0.0–1.2)
BUN / CREAT RATIO: 13 (ref 10–24)
BUN: 11 mg/dL (ref 8–27)
CO2: 22 mmol/L (ref 18–29)
CREATININE: 0.87 mg/dL (ref 0.76–1.27)
Calcium: 9 mg/dL (ref 8.6–10.2)
Chloride: 98 mmol/L (ref 96–106)
GFR calc Af Amer: 108 mL/min/{1.73_m2} (ref >59)
GFR calc non Af Amer: 93 mL/min/{1.73_m2} (ref >59)
GLOBULIN, TOTAL: 3.5 g/dL (ref 1.5–4.5)
Glucose: 81 mg/dL (ref 65–99)
POTASSIUM: 3.9 mmol/L (ref 3.5–5.2)
SODIUM: 140 mmol/L (ref 134–144)
Total Protein: 8 g/dL (ref 6.0–8.5)

## 2015-07-05 LAB — PSA, TOTAL AND FREE
PSA, Free Pct: 23.1 %
PSA, Free: 0.3 ng/mL
Prostate Specific Ag, Serum: 1.3 ng/mL (ref 0.0–4.0)

## 2015-07-05 LAB — LIPID PANEL
CHOLESTEROL TOTAL: 172 mg/dL (ref 100–199)
Chol/HDL Ratio: 3.4 ratio units (ref 0.0–5.0)
HDL: 50 mg/dL (ref >39)
LDL Calculated: 104 mg/dL — ABNORMAL HIGH (ref 0–99)
TRIGLYCERIDES: 92 mg/dL (ref 0–149)
VLDL CHOLESTEROL CAL: 18 mg/dL (ref 5–40)

## 2015-07-05 LAB — TSH: TSH: 0.08 u[IU]/mL — ABNORMAL LOW (ref 0.450–4.500)

## 2015-07-05 NOTE — Addendum Note (Signed)
Addended by: Quay BurowPOTTER, JANICE K on: 07/05/2015 01:15 PM   Modules accepted: Kipp BroodSmartSet

## 2015-07-25 ENCOUNTER — Ambulatory Visit (INDEPENDENT_AMBULATORY_CARE_PROVIDER_SITE_OTHER): Payer: BLUE CROSS/BLUE SHIELD | Admitting: Endocrinology

## 2015-07-25 VITALS — BP 131/75 | HR 60 | Temp 98.8°F | Resp 18 | Wt 204.0 lb

## 2015-07-25 DIAGNOSIS — E890 Postprocedural hypothyroidism: Secondary | ICD-10-CM | POA: Diagnosis not present

## 2015-07-25 LAB — TSH: TSH: 0.27 u[IU]/mL — AB (ref 0.35–4.50)

## 2015-07-25 NOTE — Patient Instructions (Signed)
blood tests are requested for you today.  We'll let you know about the results.   Please come back for a follow-up appointment in 3 months.   

## 2015-07-25 NOTE — Progress Notes (Signed)
   Subjective:    Patient ID: Robert [name], male    DOB: [birth year], 60 y.o.   MRN: [id number]  HPI Pt returns for f/u of post-RAI hypothyroidism (Grave's disease was dx'ed in late 2015, on a routine physical; after initial rx with tapazole, he took RAI in Oct of 2016; he started synthroid in Jan of 2017).  In early 2017, synthroid was increased to 175/d.  pt states he feels no different, and well in general.  He says he never misses the synthroid.   Past Medical History  Diagnosis Date  . Thyroid disease     Past Surgical History  Procedure Laterality Date  . No past surgeries    . [facility]  . Hernia repair  03/2014    Umbilical    Social History   Social History  . Marital Status: Single    Spouse Name: N/A  . Number of Children: N/A  . Years of Education: N/A   Occupational History  . Not on file.   Social History Main Topics  . Smoking status: Former Smoker -- 0.25 packs/day    Types: Cigarettes    Start date: 12/08/2000    Quit date: 12/09/2010  . Smokeless tobacco: Never Used  . Alcohol Use: No  . Drug Use: No  . Sexual Activity: Not on file   Other Topics Concern  . Not on file   Social History Narrative    Current Outpatient Prescriptions on File Prior to Visit  Medication Sig Dispense Refill  . [DISCONTINUED] diphenhydrAMINE (BENADRYL) 25 MG tablet Take 25-50 mg by mouth daily as needed. For allergic reaction    . [DISCONTINUED] fexofenadine (ALLEGRA) 180 MG tablet 1 po qAM for hives and itching 10 tablet 0   No current facility-administered medications on file prior to visit.    No Known Allergies  Family History  Problem Relation Age of Onset  . Thyroid disease Neg Hx   . Colon cancer Neg Hx     BP 131/75 mmHg  Pulse 60  Temp(Src) 98.8 F (37.1 C) (Oral)  Resp 18  Wt 204 lb (92.534 kg)  Review of Systems No weight change    Objective:   Physical Exam VITAL SIGNS:  See vs page GENERAL: no  distress NECK: There is no palpable thyroid enlargement.  No thyroid nodule is palpable.  No palpable lymphadenopathy at the anterior neck.   Lab Results  Component Value Date   TSH 0.27* 07/25/2015   T4TOTAL 16.5* 12/11/2013      Assessment & Plan:  Hypothyroidism, slightly overreplaced.  I have sent a prescription to your pharmacy, to reduce synthroid.   Patient is advised the following: Patient Instructions  blood tests are requested for you today.  We'll let you know about the results.  Please come back for a follow-up appointment in 3 months.     Romero BellingELLISON, Taliah Porche, MD

## 2015-07-26 MED ORDER — LEVOTHYROXINE SODIUM 150 MCG PO TABS: 150.0000 ug | ORAL_TABLET | Freq: Every day | ORAL | Status: DC

## 2015-07-27 ENCOUNTER — Telehealth: Payer: Self-pay

## 2015-07-27 NOTE — Telephone Encounter (Signed)
Called patient to notify him of new prescription sent to pharmacy, and to make follow up appointment within 3 months. Patient had no questions or concerns.

## 2015-08-24 ENCOUNTER — Ambulatory Visit (INDEPENDENT_AMBULATORY_CARE_PROVIDER_SITE_OTHER): Payer: BLUE CROSS/BLUE SHIELD | Admitting: Family Medicine

## 2015-08-24 ENCOUNTER — Encounter: Payer: Self-pay | Admitting: Family Medicine

## 2015-08-24 VITALS — BP 126/76 | HR 70 | Temp 97.0°F | Ht 70.0 in | Wt 200.6 lb

## 2015-08-24 DIAGNOSIS — T148XXA Other injury of unspecified body region, initial encounter: Secondary | ICD-10-CM

## 2015-08-24 DIAGNOSIS — R202 Paresthesia of skin: Secondary | ICD-10-CM

## 2015-08-24 DIAGNOSIS — L509 Urticaria, unspecified: Secondary | ICD-10-CM

## 2015-08-24 MED ORDER — METHYLPREDNISOLONE ACETATE 80 MG/ML IJ SUSP
80.0000 mg | Freq: Once | INTRAMUSCULAR | Status: AC
  Administered 2015-08-24: 80 mg via INTRAMUSCULAR

## 2015-08-24 MED ORDER — PREDNISONE 20 MG PO TABS: ORAL_TABLET | ORAL | 0 refills | Status: DC

## 2015-08-24 NOTE — Progress Notes (Signed)
BP 126/76 (BP Location: Left Arm, Patient Position: Sitting, Cuff Size: Large)   Pulse 70   Temp 97 F (36.1 C) (Oral)   Ht 5\' 10"  (1.778 m)   Wt 200 lb 9.6 oz (91 kg)   BMI 28.78 kg/m    Subjective:    Patient ID: Robert [name], male    DOB: 1955-11-23, 60 y.o.   MRN: [id number]  HPI: Robert [name] is a 60 y.o. male presenting on 08/24/2015 for Rash (on right arm and left collar bone; had one dose of Prednisone that he took last night) and Numbness in left leg (sometimes have numbness in outside of lower leg)   HPI Rash Patient comes in today with complaints of the new rashes been normal for the past 2 days. It is very pruritic in nature and he feels like it looks like hives. He has a few spots on his right upper inner arm and a few spots on his left collarbone. He does not know of anything new that it is a reaction to. He denies any tightness in his throat or feeling short of breath. He did take 1 dose of prednisone that he had overlying from previous course. He denies any fevers or chills or overlying erythema or drainage from any of the sites.  Left lower leg numbness Patient has intermittent left lateral lower leg numbness. He does not know of any specific trauma that could've caused this. He says sometimes it'll be there for half a day or less and then is gone for a few days. He denies any numbness or weakness anywhere else. He denies any fevers or chills or overlying skin changes. He does note that he does regular workouts and lifting weights and doesn't know if this may have irritated it.  Relevant past medical, surgical, family and social history reviewed and updated as indicated. Interim medical history since our last visit reviewed. Allergies and medications reviewed and updated.  Review of Systems  Constitutional: Negative for fever.  HENT: Negative for ear discharge and ear pain.   Eyes: Negative for discharge and visual disturbance.  Respiratory: Negative for shortness of  breath and wheezing.   Cardiovascular: Negative for chest pain and leg swelling.  Gastrointestinal: Negative for abdominal pain, constipation and diarrhea.  Genitourinary: Negative for difficulty urinating.  Musculoskeletal: Negative for back pain and gait problem.  Skin: Positive for rash. Negative for color change.  Neurological: Positive for numbness. Negative for syncope, light-headedness and headaches.  All other systems reviewed and are negative.   Per HPI unless specifically indicated above     Medication List       Accurate as of 08/24/15  8:44 AM. Always use your most recent med list.          levothyroxine 150 MCG tablet Commonly known as:  SYNTHROID, LEVOTHROID Take 1 tablet (150 mcg total) by mouth daily before breakfast.   predniSONE 20 MG tablet Commonly known as:  DELTASONE 2 po at same time daily for 5 days          Objective:    BP 126/76 (BP Location: Left Arm, Patient Position: Sitting, Cuff Size: Large)   Pulse 70   Temp 97 F (36.1 C) (Oral)   Ht 5\' 10"  (1.778 m)   Wt 200 lb 9.6 oz (91 kg)   BMI 28.78 kg/m   Wt Readings from Last 3 Encounters:  08/24/15 200 lb 9.6 oz (91 kg)  07/25/15 204 lb (92.5 kg)  07/04/15 199 lb (  90.3 kg)    Physical Exam  Constitutional: He is oriented to person, place, and time. He appears well-developed and well-nourished. No distress.  Eyes: Conjunctivae and EOM are normal. Pupils are equal, round, and reactive to light. Right eye exhibits no discharge. No scleral icterus.  Neck: Neck supple. No thyromegaly present.  Cardiovascular: Normal rate, regular rhythm, normal heart sounds and intact distal pulses.   No murmur heard. Pulmonary/Chest: Effort normal and breath sounds normal. No respiratory distress. He has no wheezes.  Musculoskeletal: Normal range of motion. He exhibits no edema.  Lymphadenopathy:    He has no cervical adenopathy.  Neurological: He is alert and oriented to person, place, and time. He has  normal strength. No cranial nerve deficit or sensory deficit (No numbness on exam today the patient says it is not present currently). Coordination normal.  Skin: Skin is warm and dry. Rash noted. Rash is urticarial (Small pink papules and some wheals on right upper inner arm and overlying left collarbone.). He is not diaphoretic. No erythema.  Psychiatric: He has a normal mood and affect. His behavior is normal.  Nursing note and vitals reviewed.     Assessment & Plan:   Problem List Items Addressed This Visit    None    Visit Diagnoses    Peripheral nerve injury    -  Primary   Numbness intermittently on left lateral lower leg. Likely peripheral nerve impingement, with a short course of steroids   Relevant Medications   methylPREDNISolone acetate (DEPO-MEDROL) injection 80 mg (Completed)   predniSONE (DELTASONE) 20 MG tablet   Localized hives       Left clavicle and right upper inner arm. Unknown what he has a reaction to.   Relevant Medications   methylPREDNISolone acetate (DEPO-MEDROL) injection 80 mg (Completed)   predniSONE (DELTASONE) 20 MG tablet       Follow up plan: Return if symptoms worsen or fail to improve.  Counseling provided for all of the vaccine components No orders of the defined types were placed in this encounter.   Arville Care, MD [facility] 08/24/2015, 8:44 AM

## 2015-10-25 ENCOUNTER — Ambulatory Visit (INDEPENDENT_AMBULATORY_CARE_PROVIDER_SITE_OTHER): Payer: BLUE CROSS/BLUE SHIELD | Admitting: Endocrinology

## 2015-10-25 ENCOUNTER — Encounter: Payer: Self-pay | Admitting: Endocrinology

## 2015-10-25 VITALS — BP 132/74 | HR 65 | Wt 200.0 lb

## 2015-10-25 DIAGNOSIS — E890 Postprocedural hypothyroidism: Secondary | ICD-10-CM | POA: Diagnosis not present

## 2015-10-25 LAB — TSH: TSH: 0.36 u[IU]/mL (ref 0.35–4.50)

## 2015-10-25 NOTE — Progress Notes (Signed)
   Subjective:    Patient ID: Robert BarterLarry [name], male    DOB: 11-Sep-1955, 60 y.o.   MRN: [id number]  HPI Pt returns for f/u of post-RAI hypothyroidism (Grave's disease was dx'ed in late 2015, on a routine physical; after initial rx with tapazole, he took RAI in Oct of 2016; he started synthroid in Jan of 2017).  pt states he feels well in general.  He says he never misses the synthroid.   Past Medical History:  Diagnosis Date  . Thyroid disease     Past Surgical History:  Procedure Laterality Date  . [place] [place]  . HERNIA REPAIR  03/2014   Umbilical  . NO PAST SURGERIES      Social History   Social History  . Marital status: Single    Spouse name: N/A  . Number of children: N/A  . Years of education: N/A   Occupational History  . Not on file.   Social History Main Topics  . Smoking status: Former Smoker    Packs/day: 0.25    Types: Cigarettes    Start date: 12/08/2000    Quit date: 12/09/2010  . Smokeless tobacco: Never Used  . Alcohol use No  . Drug use: No  . Sexual activity: Not on file   Other Topics Concern  . Not on file   Social History Narrative  . No narrative on file    Current Outpatient Prescriptions on File Prior to Visit  Medication Sig Dispense Refill  . levothyroxine (SYNTHROID, LEVOTHROID) 150 MCG tablet Take 1 tablet (150 mcg total) by mouth daily before breakfast. 30 tablet 11  . [DISCONTINUED] diphenhydrAMINE (BENADRYL) 25 MG tablet Take 25-50 mg by mouth daily as needed. For allergic reaction    . [DISCONTINUED] fexofenadine (ALLEGRA) 180 MG tablet 1 po qAM for hives and itching 10 tablet 0   No current facility-administered medications on file prior to visit.     No Known Allergies  Family History  Problem Relation Age of Onset  . Thyroid disease Neg Hx   . Colon cancer Neg Hx     BP 132/74   Pulse 65   Wt 200 lb (90.7 kg)   SpO2 98%   BMI 28.70 kg/m    Review of Systems No weight change      Objective:   Physical Exam VITAL SIGNS:  See vs page GENERAL: no distress.   eyes: no periorbital swelling, no proptosis.   NECK: There is no palpable thyroid enlargement.  No thyroid nodule is palpable.  No palpable lymphadenopathy at the anterior neck.   Lab Results  Component Value Date   TSH 0.36 10/25/2015   T4TOTAL 16.5 (H) 12/11/2013      Assessment & Plan:  Hypothyroidism: well-replaced.  Please continue the same medication

## 2015-10-25 NOTE — Patient Instructions (Signed)
A thyroid blood test is requested for you today.  We'll let you know about the results.   I would be happy to see you back here as needed.    

## 2015-10-31 DIAGNOSIS — Z23 Encounter for immunization: Secondary | ICD-10-CM | POA: Diagnosis not present

## 2016-02-23 LAB — BASIC METABOLIC PANEL: GLUCOSE: 119 mg/dL

## 2016-02-23 NOTE — Congregational Nurse Program (Unsigned)
Congregational Nurse Program Note  Date of Encounter: 02/23/2016  Past Medical History: Past Medical History:  Diagnosis Date  . Thyroid disease     Encounter Details:     CNP Questionnaire - 02/23/16 1025      Patient Demographics   Is this a new or existing patient? Existing   Patient is considered a/an Not Applicable   Race African-American/Black     Patient Assistance   Location of Patient Assistance [place]   Patient's financial/insurance status Private Insurance Coverage   Patient referred to apply for the following financial assistance Not Applicable   Food insecurities addressed Not Applicable   Transportation assistance No   Assistance securing medications No   Educational health offerings Diabetes;Hypertension;Nutrition     Encounter Details   Primary purpose of visit Education/Health Concerns;Safety   Was an Emergency Department visit averted? Not Applicable   Does patient have a medical provider? Yes   Patient referred to Not Applicable   Was a mental health screening completed? (GAINS tool) No   Does patient have dental issues? No   Does patient have vision issues? No   Does your patient have an abnormal blood pressure today? No   Since previous encounter, have you referred patient for abnormal blood pressure that resulted in a new diagnosis or medication change? No   Does your patient have an abnormal blood glucose today? No   Since previous encounter, have you referred patient for abnormal blood glucose that resulted in a new diagnosis or medication change? No   Was there a life-saving intervention made? No     Wanted B/P and Blood sugar checked.B/P 135/86  Pulse 59.  Marland Kitchen. Fasting Glucose  Level 70mg /dl  Working with [employer] for weight loss. Cecilie Kicks/leanna Lawson, RN [phone].

## 2016-03-08 NOTE — Progress Notes (Signed)
   Subjective:    Patient ID: Robert [name], male    DOB: 1955-02-17, 61 y.o.   MRN: [id number]  HPI patient is hypothyroid following radio iodine therapy. [place] but decided it would be less expensive to be followed here by his PCP. He is asymptomatic. He has symptoms of neither hypo-or hyper thyroidism.  Patient Active Problem List   Diagnosis Date Noted  . Hypothyroidism following radioiodine therapy 03/11/2015  . Urinary frequency 12/29/2014  . Thoracic aorta atherosclerosis (HCC) 04/13/2014   Outpatient Encounter Prescriptions as of 03/09/2016  Medication Sig  . levothyroxine (SYNTHROID, LEVOTHROID) 150 MCG tablet Take 1 tablet (150 mcg total) by mouth daily before breakfast.   No facility-administered encounter medications on file as of 03/09/2016.       Review of Systems  Constitutional: Negative.   HENT: Negative.   Eyes: Negative.   Respiratory: Negative.  Negative for shortness of breath.   Cardiovascular: Negative.  Negative for chest pain and leg swelling.  Gastrointestinal: Negative.   Genitourinary: Negative.   Musculoskeletal: Negative.   Skin: Negative.   Neurological: Negative.   Psychiatric/Behavioral: Negative.   All other systems reviewed and are negative.      Objective:   Physical Exam  Constitutional: He appears well-developed and well-nourished.  Neck: Normal range of motion. Neck supple. No tracheal deviation present. No thyromegaly present.  Lymphadenopathy:    He has no cervical adenopathy.   BP 113/68   Pulse 62   Temp 97.1 F (36.2 C) (Oral)   Ht 5\' 10"  (1.778 m)   Wt 200 lb (90.7 kg)   BMI 28.70 kg/m         Assessment & Plan:  1. Hypothyroidism following radioiodine therapy Has been on 150 g of Synthroid or similar drug since surgery. I am not sure why he is requesting it to be checked today but we discussed that once a year is probably sufficient as long as he is taking medicine and  dose has not changed - TSH  Frederica KusterStephen M Miller MD

## 2016-03-09 ENCOUNTER — Ambulatory Visit (INDEPENDENT_AMBULATORY_CARE_PROVIDER_SITE_OTHER): Payer: BLUE CROSS/BLUE SHIELD | Admitting: Family Medicine

## 2016-03-09 ENCOUNTER — Encounter: Payer: Self-pay | Admitting: Family Medicine

## 2016-03-09 VITALS — BP 113/68 | HR 62 | Temp 97.1°F | Ht 70.0 in | Wt 200.0 lb

## 2016-03-09 DIAGNOSIS — E890 Postprocedural hypothyroidism: Secondary | ICD-10-CM | POA: Diagnosis not present

## 2016-03-10 LAB — TSH: TSH: 0.007 u[IU]/mL — AB (ref 0.450–4.500)

## 2016-03-13 ENCOUNTER — Other Ambulatory Visit: Payer: Self-pay

## 2016-03-13 DIAGNOSIS — E890 Postprocedural hypothyroidism: Secondary | ICD-10-CM

## 2016-03-13 MED ORDER — LEVOTHYROXINE SODIUM 125 MCG PO TABS: 125.0000 ug | ORAL_TABLET | Freq: Every day | ORAL | 2 refills | Status: DC

## 2016-04-18 NOTE — Congregational Nurse Program (Signed)
Congregational Nurse Program Note  Date of Encounter: 04/18/2016  Past Medical History: Past Medical History:  Diagnosis Date  . Thyroid disease     Encounter Details:     CNP Questionnaire - 03/23/16 1130      Patient Demographics   Is this a new or existing patient? Existing   Patient is considered a/an Not Applicable   Race African-American/Black     Patient Assistance   Location of Patient Assistance [place]   Patient's financial/insurance status Private Insurance Coverage   Uninsured Patient ([place] [employer]/Care Connects) No   Patient referred to apply for the following financial assistance Not Applicable   Food insecurities addressed Not Applicable   Transportation assistance No   Assistance securing medications No   Educational health offerings Hypertension;Safety;Nutrition     Encounter Details   Primary purpose of visit Education/Health Concerns   Was an Emergency Department visit averted? Not Applicable   Does patient have a medical provider? Yes   Patient referred to Not Applicable   Was a mental health screening completed? (GAINS tool) No   Does patient have dental issues? No   Does patient have vision issues? No   Does your patient have an abnormal blood pressure today? No   Since previous encounter, have you referred patient for abnormal blood pressure that resulted in a new diagnosis or medication change? No   Does your patient have an abnormal blood glucose today? No   Since previous encounter, have you referred patient for abnormal blood glucose that resulted in a new diagnosis or medication change? No   Was there a life-saving intervention made? No     03/23/16  Requesting BP & Weight check.  On Weight [employer] Diet.  B/P 125/82 Pulse 64    Wt. 192# Watching what he eats. Didn't want B/P to go up.

## 2016-04-19 NOTE — Congregational Nurse Program (Signed)
Congregational Nurse Program Note  Date of Encounter: 04/19/2016  Past Medical History: Past Medical History:  Diagnosis Date  . Thyroid disease     Encounter Details:     CNP Questionnaire - 04/19/16 0048      Patient Demographics   Is this a new or existing patient? Existing   Patient is considered a/an Not Applicable   Race African-American/Black     Patient Assistance   Location of Patient Assistance [place]   Patient's financial/insurance status Private Insurance Coverage   Uninsured Patient ([place] [employer]/Care Connects) No   Patient referred to apply for the following financial assistance Not Applicable   Food insecurities addressed Not Applicable   Transportation assistance No   Assistance securing medications No   Educational health offerings Hypertension;Diabetes     Encounter Details   Primary purpose of visit Education/Health Concerns   Was an Emergency Department visit averted? Not Applicable   Does patient have a medical provider? Yes   Patient referred to Not Applicable   Was a mental health screening completed? (GAINS tool) No   Does patient have dental issues? No   Does patient have vision issues? No   Does your patient have an abnormal blood pressure today? No   Since previous encounter, have you referred patient for abnormal blood pressure that resulted in a new diagnosis or medication change? No   Does your patient have an abnormal blood glucose today? No   Since previous encounter, have you referred patient for abnormal blood glucose that resulted in a new diagnosis or medication change? No   Was there a life-saving intervention made? No    04/06/16  Check BP 109/71,Pulse 65 Wt. 191# Blood Sugar 112. Sticking to diet.  Cecilie Kicks, RN [phone].

## 2016-05-15 NOTE — Congregational Nurse Program (Signed)
Congregational Nurse Program Note  Date of Encounter: 05/15/2016  Past Medical History: Past Medical History:  Diagnosis Date  . Thyroid disease     Encounter Details:     CNP Questionnaire - 05/10/16 1100      Patient Demographics   Is this a new or existing patient? Existing   Patient is considered a/an Not Applicable   Race African-American/Black     Patient Assistance   Location of Patient Assistance [place]   Patient's financial/insurance status Private Insurance Coverage   Uninsured Patient ([place] [employer]/Care Connects) No   Patient referred to apply for the following financial assistance Not Applicable   Food insecurities addressed Not Applicable   Transportation assistance No   Assistance securing medications No   Educational health offerings Diabetes;Health literacy;Exercise/physical activity     Encounter Details   Primary purpose of visit Education/Health Concerns   Was an Emergency Department visit averted? Not Applicable   Patient referred to Follow up with established PCP   Was a mental health screening completed? (GAINS tool) No   Does patient have dental issues? No   Does patient have vision issues? No   Does your patient have an abnormal blood pressure today? No   Since previous encounter, have you referred patient for abnormal blood pressure that resulted in a new diagnosis or medication change? No   Does your patient have an abnormal blood glucose today? No   Since previous encounter, have you referred patient for abnormal blood glucose that resulted in a new diagnosis or medication change? No   Was there a life-saving intervention made? No     05/10/16 1100 AM   Check B/P and Blood sugar,busy @ work this Am (stressful).  B/P 122/77, Blood sugar 94.  Remains on Weight- [employer] Program for weight control, watching diet intake.Prefers not to weigh today. Cecilie Kicks, RN [phone].

## 2016-05-23 ENCOUNTER — Ambulatory Visit (INDEPENDENT_AMBULATORY_CARE_PROVIDER_SITE_OTHER): Payer: BLUE CROSS/BLUE SHIELD | Admitting: Family Medicine

## 2016-05-23 ENCOUNTER — Encounter: Payer: Self-pay | Admitting: Family Medicine

## 2016-05-23 VITALS — BP 112/68 | HR 62 | Temp 96.8°F | Ht 70.0 in | Wt 185.5 lb

## 2016-05-23 DIAGNOSIS — S139XXA Sprain of joints and ligaments of unspecified parts of neck, initial encounter: Secondary | ICD-10-CM | POA: Diagnosis not present

## 2016-05-23 DIAGNOSIS — E890 Postprocedural hypothyroidism: Secondary | ICD-10-CM

## 2016-05-23 NOTE — Progress Notes (Signed)
BP 112/68   Pulse 62   Temp (!) 96.8 F (36 C) (Oral)   Ht  (1.778 m)   Wt 185 lb 8 oz (84.1 kg)   BMI 26.62 kg/m    Subjective:    Patient ID: Robert [name], male    DOB: 11/11/1955, 61 y.o.   MRN: [id number]  HPI: Robert [name] is a 61 y.o. male presenting on 05/23/2016 for Labwork (thyroid recheck) and Neck Pain   HPI Hypothyroidism recheck Patient is coming in for thyroid recheck today as well. He denies any issues with hair changes or heat or cold problems or diarrhea or constipation. He denies any chest pain or palpitations. He is currently on levothyroxine 125 micrograms. He was just lowered down from 150 g for being too high on his previous check  Neck pain  Patient has been having some neck tightness that extends lateral aspect of his neck down into the upper part of his shoulders on both sides is worse on the left than the right. Cannot recall any specific incident that brought it on him but he has been having this issue over the past couple days. He awoke with this issue yesterday in the morning. He denies any fevers or chills or weight rashes or visual disturbances or changes in hearing.  Relevant past medical, surgical, family and social history reviewed and updated as indicated. Interim medical history since our last visit reviewed. Allergies and medications reviewed and updated.  Review of Systems  Constitutional: Negative for chills and fever.  Respiratory: Negative for shortness of breath and wheezing.   Cardiovascular: Negative for chest pain and leg swelling.  Gastrointestinal: Negative for abdominal pain, constipation and diarrhea.  Endocrine: Negative for cold intolerance, heat intolerance, polydipsia and polyuria.  Musculoskeletal: Positive for myalgias and neck pain. Negative for back pain and gait problem.  Skin: Negative for rash.  Neurological: Negative for dizziness, weakness, numbness and headaches.  All other systems reviewed and are  negative.   Per HPI unless specifically indicated above     Objective:    BP 112/68   Pulse 62   Temp (!) 96.8 F (36 C) (Oral)   Ht  (1.778 m)   Wt 185 lb 8 oz (84.1 kg)   BMI 26.62 kg/m   Wt Readings from Last 3 Encounters:  05/23/16 185 lb 8 oz (84.1 kg)  03/09/16 200 lb (90.7 kg)  10/25/15 200 lb (90.7 kg)    Physical Exam  Constitutional: He is oriented to person, place, and time. He appears well-developed and well-nourished. No distress.  Eyes: Conjunctivae are normal. No scleral icterus.  Neck: Neck supple. No thyromegaly present.  Cardiovascular: Normal rate, regular rhythm, normal heart sounds and intact distal pulses.   No murmur heard. Pulmonary/Chest: Effort normal and breath sounds normal. No respiratory distress. He has no wheezes. He has no rales.  Musculoskeletal: Normal range of motion. He exhibits tenderness (Cervical paraspinal muscular tenderness, no midline tenderness). He exhibits no edema.  Lymphadenopathy:    He has no cervical adenopathy.  Neurological: He is alert and oriented to person, place, and time. Coordination normal.  Skin: Skin is warm and dry. No rash noted. He is not diaphoretic.  Psychiatric: He has a normal mood and affect. His behavior is normal.  Nursing note and vitals reviewed.       Assessment & Plan:   Problem List Items Addressed This Visit      Endocrine   Hypothyroidism following radioiodine therapy -  Primary   Relevant Orders   TSH    Other Visit Diagnoses    Neck sprain, initial encounter       Recommend ice and heat and stretching       Follow up plan: Return in about 6 months (around 11/23/2016), or if symptoms worsen or fail to improve, for Thyroid and fasting labs.  Counseling provided for all of the vaccine components Orders Placed This Encounter  Procedures  . TSH    Arville Care, MD [facility] 05/23/2016, 4:17 PM

## 2016-05-24 ENCOUNTER — Other Ambulatory Visit: Payer: Self-pay

## 2016-05-24 LAB — TSH: TSH: 0.008 u[IU]/mL — ABNORMAL LOW (ref 0.450–4.500)

## 2016-05-24 MED ORDER — LEVOTHYROXINE SODIUM 100 MCG PO TABS: 100.0000 ug | ORAL_TABLET | Freq: Every day | ORAL | 3 refills | Status: DC

## 2016-05-30 NOTE — Congregational Nurse Program (Unsigned)
Congregational Nurse Program Note  Date of Encounter: 05/30/2016  Past Medical History: Past Medical History:  Diagnosis Date  . Thyroid disease     Encounter Details:     CNP Questionnaire - 05/24/16 0945      Patient Demographics   Is this a new or existing patient? Existing   Patient is considered a/an Not Applicable   Race African-American/Black     Patient Assistance   Location of Patient Assistance [place]   Patient's financial/insurance status Private Insurance Coverage   Uninsured Patient ([place] [employer]/Care Connects) No   Patient referred to apply for the following financial assistance Not Applicable   Food insecurities addressed Not Applicable   Transportation assistance No   Assistance securing medications No   Educational health offerings Nutrition;Safety;Diabetes;Hypertension     Encounter Details   Primary purpose of visit Safety;Education/Health Concerns   Was an Emergency Department visit averted? Not Applicable   Does patient have a medical provider? Yes   Patient referred to Not Applicable   Was a mental health screening completed? (GAINS tool) No   Does patient have dental issues? No   Does patient have vision issues? No   Does your patient have an abnormal blood pressure today? No   Since previous encounter, have you referred patient for abnormal blood pressure that resulted in a new diagnosis or medication change? No   Does your patient have an abnormal blood glucose today? No   Since previous encounter, have you referred patient for abnormal blood glucose that resulted in a new diagnosis or medication change? No   Was there a life-saving intervention made? No    05/24/16  09:45  [place] Sugar 92; B/P 107/70 Pulse 68.  Remains on Weight Watcher's Diet.  Cont. To eat healthy.  Cecilie KicksLeanna Clement Deneault, RN [phone]

## 2016-07-04 ENCOUNTER — Ambulatory Visit: Payer: BLUE CROSS/BLUE SHIELD | Admitting: Family Medicine

## 2016-07-06 ENCOUNTER — Encounter: Payer: Self-pay | Admitting: Family Medicine

## 2016-07-06 ENCOUNTER — Ambulatory Visit (INDEPENDENT_AMBULATORY_CARE_PROVIDER_SITE_OTHER): Payer: BLUE CROSS/BLUE SHIELD | Admitting: Family Medicine

## 2016-07-06 VITALS — BP 115/70 | HR 76 | Temp 97.6°F | Ht 70.0 in | Wt 186.0 lb

## 2016-07-06 DIAGNOSIS — E890 Postprocedural hypothyroidism: Secondary | ICD-10-CM

## 2016-07-06 NOTE — Progress Notes (Signed)
   BP 115/70   Pulse 76   Temp 97.6 F (36.4 C) (Oral)   Ht 5\' 10"  (1.778 m)   Wt 186 lb (84.4 kg)   BMI 26.69 kg/m    Subjective:    Patient ID: Robert [name], male    DOB: [birth year], 61 y.o.   MRN: [id number]  HPI: Robert [name] is a 61 y.o. male presenting on 07/06/2016 for Hypothyroidism (followup labwork)   HPI Hypothyroidism recheck Patient is coming in for thyroid recheck today as well. He denies any issues with hair changes or heat or cold problems or diarrhea or constipation. He denies any chest pain or palpitations. He is currently on levothyroxine 100 micrograms, it was lowered from the previous time because his TSH was 0.8. We will recheck it today and see where it sat. He denies any side effects from it and feels pretty well.   Relevant past medical, surgical, family and social history reviewed and updated as indicated. Interim medical history since our last visit reviewed. Allergies and medications reviewed and updated.  Review of Systems  Constitutional: Negative for chills and fever.  Respiratory: Negative for shortness of breath and wheezing.   Cardiovascular: Negative for chest pain and leg swelling.  Endocrine: Negative for cold intolerance and heat intolerance.  Musculoskeletal: Negative for back pain and gait problem.  Skin: Negative for rash.  Neurological: Negative for dizziness, weakness, light-headedness and numbness.  All other systems reviewed and are negative.   Per HPI unless specifically indicated above        Objective:    BP 115/70   Pulse 76   Temp 97.6 F (36.4 C) (Oral)   Ht 5\' 10"  (1.778 m)   Wt 186 lb (84.4 kg)   BMI 26.69 kg/m   Wt Readings from Last 3 Encounters:  07/06/16 186 lb (84.4 kg)  05/23/16 185 lb 8 oz (84.1 kg)  03/09/16 200 lb (90.7 kg)    Physical Exam  Constitutional: He is oriented to person, place, and time. He appears well-developed and well-nourished. No distress.  Eyes: Conjunctivae are normal. No scleral  icterus.  Cardiovascular: Normal rate, regular rhythm, normal heart sounds and intact distal pulses.   No murmur heard. Pulmonary/Chest: Effort normal and breath sounds normal. No respiratory distress. He has no wheezes.  Musculoskeletal: Normal range of motion. He exhibits no edema.  Neurological: He is alert and oriented to person, place, and time. Coordination normal.  Skin: Skin is warm and dry. No rash noted. He is not diaphoretic.  Psychiatric: He has a normal mood and affect. His behavior is normal.  Nursing note and vitals reviewed.   Results for orders placed or performed in visit on 05/23/16  TSH  Result Value Ref Range   TSH 0.008 (L) 0.450 - 4.500 uIU/mL      Assessment & Plan:   Problem List Items Addressed This Visit      Endocrine   Hypothyroidism following radioiodine therapy - Primary   Relevant Orders   TSH       Follow up plan: Return in about 6 months (around 01/05/2017), or if symptoms worsen or fail to improve, for Thyroid.  Counseling provided for all of the vaccine components Orders Placed This Encounter  Procedures  . TSH    Arville CareJoshua Ricardo Schubach, MD [facility] 07/06/2016, 4:43 PM

## 2016-07-07 LAB — TSH: TSH: 0.01 u[IU]/mL — ABNORMAL LOW (ref 0.450–4.500)

## 2016-07-09 ENCOUNTER — Other Ambulatory Visit: Payer: Self-pay

## 2016-07-09 MED ORDER — LEVOTHYROXINE SODIUM 75 MCG PO TABS: 75.0000 ug | ORAL_TABLET | Freq: Every day | ORAL | 3 refills | Status: DC

## 2016-07-18 NOTE — Congregational Nurse Program (Signed)
Congregational Nurse Program Note  Date of Encounter: 07/18/2016  Past Medical History: Past Medical History:  Diagnosis Date  . Thyroid disease     Encounter Details:     CNP Questionnaire - 06/21/16 1139      Patient Demographics   Is this a new or existing patient? Existing   Patient is considered a/an Not Applicable   Race African-American/Black     Patient Assistance   Location of Patient Assistance LOT [id number]   Patient's financial/insurance status Private Insurance Coverage   Uninsured Patient ([place] Card/Care Connects) No   Patient referred to apply for the following financial assistance Not Applicable   Food insecurities addressed Not Applicable   Transportation assistance No   Assistance securing medications No   Educational health offerings Exercise/physical activity;Nutrition     Encounter Details   Primary purpose of visit Education/Health Concerns   Was an Emergency Department visit averted? Not Applicable   Does patient have a medical provider? Yes   Patient referred to Not Applicable   Was a mental health screening completed? (GAINS tool) No   Does patient have dental issues? No   Does patient have vision issues? No   Does your patient have an abnormal blood pressure today? No   Since previous encounter, have you referred patient for abnormal blood pressure that resulted in a new diagnosis or medication change? No   Does your patient have an abnormal blood glucose today? No   Since previous encounter, have you referred patient for abnormal blood glucose that resulted in a new diagnosis or medication change? No   Was there a life-saving intervention made? No    On Weight Watchers' Program maintaining weight and B/P with in normal limits.  B/P 117/76  Weight 182#.  Continue as well as you are doing and following Dr's orders. Cecilie KicksLeanna Courtney Fenlon, RN [phone].

## 2016-09-06 ENCOUNTER — Encounter: Payer: Self-pay | Admitting: Family Medicine

## 2016-09-06 ENCOUNTER — Ambulatory Visit (INDEPENDENT_AMBULATORY_CARE_PROVIDER_SITE_OTHER): Payer: BLUE CROSS/BLUE SHIELD | Admitting: Family Medicine

## 2016-09-06 VITALS — BP 110/66 | HR 56 | Temp 97.4°F | Ht 70.0 in | Wt 183.0 lb

## 2016-09-06 DIAGNOSIS — Z13220 Encounter for screening for lipoid disorders: Secondary | ICD-10-CM

## 2016-09-06 DIAGNOSIS — E890 Postprocedural hypothyroidism: Secondary | ICD-10-CM | POA: Diagnosis not present

## 2016-09-06 DIAGNOSIS — Z1159 Encounter for screening for other viral diseases: Secondary | ICD-10-CM | POA: Diagnosis not present

## 2016-09-06 DIAGNOSIS — Z131 Encounter for screening for diabetes mellitus: Secondary | ICD-10-CM | POA: Diagnosis not present

## 2016-09-06 NOTE — Progress Notes (Signed)
BP 110/66   Pulse (!) 56   Temp (!) 97.4 F (36.3 C) (Oral)   Ht '5\' 10"'  (1.778 m)   Wt 183 lb (83 kg)   BMI 26.26 kg/m    Subjective:    Patient ID: Robert [name], male    DOB: Aug 06, 1955, 61 y.o.   MRN: [id number]  HPI: Robert [name] is a 61 y.o. male presenting on 09/06/2016 for Hypothyroidism (followup)   HPI Hypothyroidism recheck Patient is coming in for thyroid recheck today as well. They deny any issues with hair changes or heat or cold problems or diarrhea or constipation. They deny any chest pain or palpitations. They are currently on levothyroxine 75 micrograms. He says that he does feel fatigued but does have some issues with sleep. He does feel tired today and takes naps. He does admit that he snores at night but does not think he stops breathing and will think about the sleep study for future. We discussed the possibility of melatonin and he will consider trying to get some see how it would help him.  Relevant past medical, surgical, family and social history reviewed and updated as indicated. Interim medical history since our last visit reviewed. Allergies and medications reviewed and updated.  Review of Systems  Constitutional: Positive for fatigue. Negative for chills and fever.  Eyes: Negative for discharge.  Respiratory: Negative for shortness of breath and wheezing.   Cardiovascular: Negative for chest pain and leg swelling.  Endocrine: Negative for cold intolerance and heat intolerance.  Musculoskeletal: Negative for back pain and gait problem.  Skin: Negative for rash.  Neurological: Negative for dizziness, weakness, light-headedness and numbness.  All other systems reviewed and are negative.   Per HPI unless specifically indicated above      Objective:    BP 110/66   Pulse (!) 56   Temp (!) 97.4 F (36.3 C) (Oral)   Ht '5\' 10"'  (1.778 m)   Wt 183 lb (83 kg)   BMI 26.26 kg/m   Wt Readings from Last 3 Encounters:  09/06/16 183 lb (83 kg)  07/06/16  186 lb (84.4 kg)  05/23/16 185 lb 8 oz (84.1 kg)    Physical Exam  Constitutional: He is oriented to person, place, and time. He appears well-developed and well-nourished. No distress.  Eyes: Conjunctivae are normal. No scleral icterus.  Neck: Neck supple. No thyromegaly present.  Cardiovascular: Normal rate, regular rhythm, normal heart sounds and intact distal pulses.   No murmur heard. Pulmonary/Chest: Effort normal and breath sounds normal. No respiratory distress. He has no wheezes. He has no rales.  Musculoskeletal: Normal range of motion. He exhibits no edema.  Lymphadenopathy:    He has no cervical adenopathy.  Neurological: He is alert and oriented to person, place, and time. Coordination normal.  Skin: Skin is warm and dry. No rash noted. He is not diaphoretic.  Psychiatric: He has a normal mood and affect. His behavior is normal.  Nursing note and vitals reviewed.   Results for orders placed or performed in visit on 07/06/16  TSH  Result Value Ref Range   TSH 0.010 (L) 0.450 - 4.500 uIU/mL      Assessment & Plan:   Problem List Items Addressed This Visit      Endocrine   Hypothyroidism following radioiodine therapy - Primary   Relevant Orders   Thyroid Panel With TSH    Other Visit Diagnoses    Need for hepatitis C screening test  Relevant Orders   Hepatitis C antibody   Diabetes mellitus screening       Relevant Orders   CMP14+EGFR   Need for lipid screening       Relevant Orders   Lipid panel       Follow up plan: Return in about 6 months (around 03/09/2017), or if symptoms worsen or fail to improve, for Thyroid recheck.  Counseling provided for all of the vaccine components Orders Placed This Encounter  Procedures  . Thyroid Panel With TSH  . Hepatitis C antibody  . CMP14+EGFR  . Lipid panel    Caryl Pina, MD [facility] 09/06/2016, 8:17 AM

## 2016-09-07 LAB — CMP14+EGFR
A/G RATIO: 1.4 (ref 1.2–2.2)
ALT: 15 IU/L (ref 0–44)
AST: 30 IU/L (ref 0–40)
Albumin: 4.4 g/dL (ref 3.6–4.8)
Alkaline Phosphatase: 66 IU/L (ref 39–117)
BILIRUBIN TOTAL: 0.4 mg/dL (ref 0.0–1.2)
BUN/Creatinine Ratio: 14 (ref 10–24)
BUN: 13 mg/dL (ref 8–27)
CALCIUM: 8.6 mg/dL (ref 8.6–10.2)
CHLORIDE: 102 mmol/L (ref 96–106)
CO2: 25 mmol/L (ref 20–29)
Creatinine, Ser: 0.93 mg/dL (ref 0.76–1.27)
GFR calc Af Amer: 101 mL/min/{1.73_m2} (ref >59)
GFR, EST NON AFRICAN AMERICAN: 88 mL/min/{1.73_m2} (ref >59)
Globulin, Total: 3.1 g/dL (ref 1.5–4.5)
Glucose: 86 mg/dL (ref 65–99)
POTASSIUM: 4.3 mmol/L (ref 3.5–5.2)
SODIUM: 142 mmol/L (ref 134–144)
TOTAL PROTEIN: 7.5 g/dL (ref 6.0–8.5)

## 2016-09-07 LAB — LIPID PANEL
CHOL/HDL RATIO: 2.7 ratio (ref 0.0–5.0)
Cholesterol, Total: 150 mg/dL (ref 100–199)
HDL: 55 mg/dL (ref >39)
LDL Calculated: 86 mg/dL (ref 0–99)
TRIGLYCERIDES: 45 mg/dL (ref 0–149)
VLDL Cholesterol Cal: 9 mg/dL (ref 5–40)

## 2016-09-07 LAB — THYROID PANEL WITH TSH
Free Thyroxine Index: 2.2 (ref 1.2–4.9)
T3 Uptake Ratio: 24 % (ref 24–39)
T4, Total: 9.1 ug/dL (ref 4.5–12.0)
TSH: 1.26 u[IU]/mL (ref 0.450–4.500)

## 2016-09-07 LAB — HEPATITIS C ANTIBODY: HEP C VIRUS AB: 0.5 {s_co_ratio} (ref 0.0–0.9)

## 2016-09-11 ENCOUNTER — Telehealth: Payer: Self-pay | Admitting: Family Medicine

## 2016-09-11 MED ORDER — LEVOTHYROXINE SODIUM 75 MCG PO TABS: 75.0000 ug | ORAL_TABLET | Freq: Every day | ORAL | 3 refills | Status: DC

## 2016-09-11 NOTE — Telephone Encounter (Signed)
Pt aware.

## 2016-09-12 NOTE — Congregational Nurse Program (Signed)
Congregational Nurse Program Note  Date of Encounter: 09/12/2016  Past Medical History: Past Medical History:  Diagnosis Date  . Thyroid disease     Encounter Details:     CNP Questionnaire - 09/12/16 2257      Patient Demographics   Is this a new or existing patient? Existing   Patient is considered a/an Not Applicable   Race African-American/Black     Patient Assistance   Location of Patient Assistance LOT   Patient's financial/insurance status Private Insurance Coverage   Uninsured Patient ([place] Card/Care Connects) No   Patient referred to apply for the following financial assistance Not Applicable   Food insecurities addressed Not Applicable   Transportation assistance No   Assistance securing medications No     Encounter Details   Primary purpose of visit Education/Health Concerns;Acute Illness/Condition Visit   Was an Emergency Department visit averted? Not Applicable   Does patient have a medical provider? Yes   Patient referred to Not Applicable   Was a mental health screening completed? (GAINS tool) No   Does patient have dental issues? No   Does patient have vision issues? No   Does your patient have an abnormal blood pressure today? No   Since previous encounter, have you referred patient for abnormal blood pressure that resulted in a new diagnosis or medication change? No   Does your patient have an abnormal blood glucose today? No   Since previous encounter, have you referred patient for abnormal blood glucose that resulted in a new diagnosis or medication change? No   Was there a life-saving intervention made? No     08/17/16 - Blood sugar 80 this AM . Ate more this AM. B/P 113/78 Pulse 69.  Cecilie Kicks, RN [phone].

## 2016-10-12 LAB — BASIC METABOLIC PANEL: GLUCOSE: 95

## 2016-11-14 NOTE — Congregational Nurse Program (Signed)
Congregational Nurse Program Note  Date of Encounter: 11/14/2016  Past Medical History: Past Medical History:  Diagnosis Date  . Thyroid disease     Encounter Details:     CNP Questionnaire - 11/08/16 1028      Patient Demographics   Is this a new or existing patient? Existing   Race African-American/Black     Patient Assistance   Location of Patient Assistance LOT [id number]   Uninsured Patient ([place] Card/Care Connects) No   Patient referred to apply for the following financial assistance Not Applicable   Food insecurities addressed Not Applicable   Transportation assistance No   Educational health offerings Diabetes;Hypertension;Exercise/physical activity     Encounter Details   Primary purpose of visit Education/Health Concerns   Was an Emergency Department visit averted? Not Applicable   Does patient have a medical provider? Yes   Patient referred to Not Applicable   Was a mental health screening completed? (GAINS tool) No   Does patient have dental issues? No   Does patient have vision issues? No   Does your patient have an abnormal blood pressure today? No   Since previous encounter, have you referred patient for abnormal blood pressure that resulted in a new diagnosis or medication change? No   Does your patient have an abnormal blood glucose today? No   Since previous encounter, have you referred patient for abnormal blood glucose that resulted in a new diagnosis or medication change? No   Was there a life-saving intervention made? No    11/08/16 Check Blood Pressure and Blood sugar.Been watching diet . B/P 110/72; Blood sugar random, not fasting 112.  Cecilie KicksLeanna Annelie Boak, RN [phone].

## 2016-11-23 LAB — BASIC METABOLIC PANEL: GLUCOSE: 94

## 2016-12-06 LAB — BASIC METABOLIC PANEL: Glucose: 107

## 2017-02-15 LAB — BASIC METABOLIC PANEL: Glucose: 90

## 2017-03-15 ENCOUNTER — Encounter: Payer: Self-pay | Admitting: Family Medicine

## 2017-03-15 ENCOUNTER — Ambulatory Visit: Payer: BLUE CROSS/BLUE SHIELD | Admitting: Family Medicine

## 2017-03-15 VITALS — BP 117/73 | HR 59 | Temp 98.0°F | Ht 70.0 in | Wt 189.0 lb

## 2017-03-15 DIAGNOSIS — E890 Postprocedural hypothyroidism: Secondary | ICD-10-CM | POA: Diagnosis not present

## 2017-03-15 NOTE — Progress Notes (Signed)
   There were no vitals taken for this visit.   Subjective:    Patient ID: Robert [name], male    DOB: [birth year], 62 y.o.   MRN: [id number]  HPI: Robert [name] is a 62 y.o. male presenting on 03/15/2017 for No chief complaint on file.   HPI Hypothyroidism recheck Patient is coming in for thyroid recheck today as well. They deny any issues with hair changes or heat or cold problems or diarrhea or constipation. They deny any chest pain or palpitations. They are currently on levothyroxine 75micrograms   Relevant past medical, surgical, family and social history reviewed and updated as indicated. Interim medical history since our last visit reviewed. Allergies and medications reviewed and updated.  Review of Systems  Constitutional: Negative for chills and fever.  Eyes: Negative for discharge.  Respiratory: Negative for shortness of breath and wheezing.   Cardiovascular: Negative for chest pain and leg swelling.  Endocrine: Negative for cold intolerance and heat intolerance.  Musculoskeletal: Negative for back pain and gait problem.  Skin: Negative for rash.  Neurological: Negative for dizziness, light-headedness and numbness.  All other systems reviewed and are negative.   Per HPI unless specifically indicated above   Allergies as of 03/15/2017   No Known Allergies     Medication List        Accurate as of 03/15/17 11:29 AM. Always use your most recent med list.          levothyroxine 75 MCG tablet Commonly known as:  SYNTHROID, LEVOTHROID Take 1 tablet (75 mcg total) by mouth daily.          Objective:    There were no vitals taken for this visit.  Wt Readings from Last 3 Encounters:  09/06/16 183 lb (83 kg)  07/06/16 186 lb (84.4 kg)  05/23/16 185 lb 8 oz (84.1 kg)    Physical Exam  Constitutional: He is oriented to person, place, and time. He appears well-developed and well-nourished. No distress.  Eyes: Conjunctivae are normal. No scleral icterus.  Neck:  Neck supple. No thyromegaly present.  Cardiovascular: Normal rate, regular rhythm, normal heart sounds and intact distal pulses.  No murmur heard. Pulmonary/Chest: Effort normal and breath sounds normal. No respiratory distress. He has no wheezes. He has no rales.  Musculoskeletal: Normal range of motion. He exhibits no edema.  Lymphadenopathy:    He has no cervical adenopathy.  Neurological: He is alert and oriented to person, place, and time. Coordination normal.  Skin: Skin is warm and dry. No rash noted. He is not diaphoretic.  Psychiatric: He has a normal mood and affect. His behavior is normal.  Nursing note and vitals reviewed.       Assessment & Plan:   Problem List Items Addressed This Visit      Endocrine   Hypothyroidism following radioiodine therapy - Primary   Relevant Orders   TSH       Follow up plan: Return in about 6 months (around 09/12/2017), or if symptoms worsen or fail to improve, for Thyroid recheck.  Counseling provided for all of the vaccine components Orders Placed This Encounter  Procedures  . TSH    Arville CareJoshua Athanasios Heldman, MD [facility] 03/15/2017, 11:29 AM

## 2017-03-16 LAB — TSH: TSH: 1.9 u[IU]/mL (ref 0.450–4.500)

## 2017-03-18 ENCOUNTER — Other Ambulatory Visit: Payer: Self-pay | Admitting: *Deleted

## 2017-03-18 MED ORDER — LEVOTHYROXINE SODIUM 75 MCG PO TABS: 75.0000 ug | ORAL_TABLET | Freq: Every day | ORAL | 3 refills | Status: DC

## 2017-04-05 LAB — GLUCOSE, POCT (MANUAL RESULT ENTRY): POC Glucose: 95 mg/dl (ref 70–99)

## 2017-09-20 ENCOUNTER — Ambulatory Visit (INDEPENDENT_AMBULATORY_CARE_PROVIDER_SITE_OTHER): Payer: BLUE CROSS/BLUE SHIELD | Admitting: Family Medicine

## 2017-09-20 ENCOUNTER — Encounter: Payer: Self-pay | Admitting: Family Medicine

## 2017-09-20 VITALS — BP 125/70 | HR 63 | Temp 97.3°F | Ht 70.0 in | Wt 183.6 lb

## 2017-09-20 DIAGNOSIS — L298 Other pruritus: Secondary | ICD-10-CM

## 2017-09-20 DIAGNOSIS — K640 First degree hemorrhoids: Secondary | ICD-10-CM | POA: Diagnosis not present

## 2017-09-20 DIAGNOSIS — I700 Atherosclerosis of aorta: Secondary | ICD-10-CM | POA: Diagnosis not present

## 2017-09-20 DIAGNOSIS — Z9189 Other specified personal risk factors, not elsewhere classified: Secondary | ICD-10-CM | POA: Diagnosis not present

## 2017-09-20 DIAGNOSIS — E890 Postprocedural hypothyroidism: Secondary | ICD-10-CM | POA: Diagnosis not present

## 2017-09-20 DIAGNOSIS — E663 Overweight: Secondary | ICD-10-CM

## 2017-09-20 DIAGNOSIS — B356 Tinea cruris: Secondary | ICD-10-CM

## 2017-09-20 MED ORDER — NYSTATIN 100000 UNIT/GM EX POWD: Freq: Four times a day (QID) | CUTANEOUS | 0 refills | Status: DC

## 2017-09-20 MED ORDER — PHENYLEPHRINE IN HARD FAT 0.25 % RE SUPP: 1.0000 | Freq: Two times a day (BID) | RECTAL | 0 refills | Status: DC

## 2017-09-20 NOTE — Progress Notes (Signed)
BP 125/70   Pulse 63   Temp (!) 97.3 F (36.3 C) (Oral)   Ht 5\' 10"  (1.778 m)   Wt 183 lb 9.6 oz (83.3 kg)   BMI 26.34 kg/m    Subjective:    Patient ID: Robert [name], male    DOB: [birth year], 62 y.o.   MRN: [id number]  HPI: Robert [name] is a 62 y.o. male presenting on 09/20/2017 for Annual Exam   HPI Hypothyroidism recheck Patient is coming in for thyroid recheck today as well. They deny any issues with hair changes or heat or cold problems or diarrhea or constipation. They deny any chest pain or palpitations. They are currently on levothyroxine 75 micrograms   Patient has a history of thoracic aortic atherosclerosis on imaging We have discussed statin therapy but patient's ASCVD risk score is 7 so this is something we will keep the mind for the future.  Patient declines treatment today.  Patient says is been having some rectal itching and pain and thinks he has a hemorrhoid he wants to get that checked today.  Patient also says is been having some jock itch and has been using some over-the-counter creams for both and they have helped some but not completely.  Patient does say he is intermittently constipated and has to strain sometimes but uses MiraLAX and a stool softener to help with this.  Relevant past medical, surgical, family and social history reviewed and updated as indicated. Interim medical history since our last visit reviewed. Allergies and medications reviewed and updated.  Review of Systems  Constitutional: Negative for chills, fever and unexpected weight change.  Eyes: Negative for visual disturbance.  Respiratory: Negative for shortness of breath and wheezing.   Cardiovascular: Negative for chest pain and leg swelling.  Gastrointestinal: Positive for rectal pain.  Endocrine: Negative for cold intolerance and heat intolerance.  Musculoskeletal: Negative for back pain and gait problem.  Skin: Positive for rash.  Neurological: Negative for dizziness, weakness,  light-headedness and headaches.  All other systems reviewed and are negative.   Per HPI unless specifically indicated above   Allergies as of 09/20/2017   No Known Allergies     Medication List        Accurate as of 09/20/17 10:10 AM. Always use your most recent med list.          levothyroxine 75 MCG tablet Commonly known as:  SYNTHROID, LEVOTHROID Take 1 tablet (75 mcg total) by mouth daily.   nystatin powder Commonly known as:  MYCOSTATIN/NYSTOP Apply topically 4 (four) times daily.   phenylephrine 0.25 % suppository Commonly known as:  (USE for PREPARATION-H) Place 1 suppository rectally 2 (two) times daily.          Objective:    BP 125/70   Pulse 63   Temp (!) 97.3 F (36.3 C) (Oral)   Ht 5\' 10"  (1.778 m)   Wt 183 lb 9.6 oz (83.3 kg)   BMI 26.34 kg/m   Wt Readings from Last 3 Encounters:  09/20/17 183 lb 9.6 oz (83.3 kg)  03/15/17 189 lb (85.7 kg)  09/06/16 183 lb (83 kg)    Physical Exam  Constitutional: He is oriented to person, place, and time. He appears well-developed and well-nourished. No distress.  Eyes: Pupils are equal, round, and reactive to light. Conjunctivae and EOM are normal. Right eye exhibits no discharge. No scleral icterus.  Neck: Neck supple. No thyromegaly present.  Cardiovascular: Normal rate, regular rhythm, normal heart sounds and intact distal  pulses.  No murmur heard. Pulmonary/Chest: Effort normal and breath sounds normal. No respiratory distress. He has no wheezes.  Genitourinary: Rectal exam shows external hemorrhoid. Rectal exam shows no fissure and anal tone normal. Prostate is enlarged (Mildly diffusely enlarged). Prostate is not tender.  Musculoskeletal: Normal range of motion. He exhibits no edema.  Lymphadenopathy:    He has no cervical adenopathy.  Neurological: He is alert and oriented to person, place, and time. Coordination normal.  Skin: Skin is warm and dry. Rash noted. Rash is macular (Small macular rash with  pink discoloration consistent with jock itch). He is not diaphoretic.  Psychiatric: He has a normal mood and affect. His behavior is normal.  Nursing note and vitals reviewed.       Assessment & Plan:   Problem List Items Addressed This Visit      Cardiovascular and Mediastinum   Thoracic aorta atherosclerosis (HCC)     Endocrine   Hypothyroidism following radioiodine therapy - Primary     Other   Overweight (BMI 25.0-29.9)    Other Visit Diagnoses    First degree hemorrhoids       Relevant Medications   phenylephrine (,USE FOR PREPARATION-H,) 0.25 % suppository   Jock itch       Relevant Medications   nystatin (MYCOSTATIN/NYSTOP) powder   [place] cardiac risk <10% in next 10 years         Sent in rectal cream and powder for the patient for his jock itch and hemorrhoids.  Continue levothyroxine.  Patient has atherosclerosis of the aorta on imaging but has a low Framingham risk score and declines statin therapy at this point The 10-year ASCVD risk score Denman George(Goff [place] Montez HagemanJr., et al., 2013) is: 7.9%   Values used to calculate the score:     Age: [age] years     Sex: Male     Is Non-Hispanic African American: Yes     Diabetic: No     Tobacco smoker: No     Systolic Blood Pressure: 125 mmHg     Is BP treated: No     HDL Cholesterol: 55 mg/dL     Total Cholesterol: 150 mg/dL   Follow up plan: Return in about 6 months (around 03/22/2018), or if symptoms worsen or fail to improve, for Thyroid recheck.  Counseling provided for all of the vaccine components No orders of the defined types were placed in this encounter.   Arville CareJoshua Dettinger, MD [facility] 09/20/2017, 10:10 AM

## 2017-09-20 NOTE — Addendum Note (Signed)
Addended by: Arville CareETTINGER, Nohemi Nicklaus on: 09/20/2017 11:23 AM   Modules accepted: Orders

## 2017-09-21 LAB — CMP14+EGFR
ALBUMIN: 4.5 g/dL (ref 3.6–4.8)
ALK PHOS: 64 IU/L (ref 39–117)
ALT: 14 IU/L (ref 0–44)
AST: 25 IU/L (ref 0–40)
Albumin/Globulin Ratio: 1.3 (ref 1.2–2.2)
BUN / CREAT RATIO: 10 (ref 10–24)
BUN: 10 mg/dL (ref 8–27)
Bilirubin Total: 0.6 mg/dL (ref 0.0–1.2)
CO2: 24 mmol/L (ref 20–29)
CREATININE: 1.01 mg/dL (ref 0.76–1.27)
Calcium: 9.2 mg/dL (ref 8.6–10.2)
Chloride: 101 mmol/L (ref 96–106)
GFR calc non Af Amer: 79 mL/min/{1.73_m2} (ref >59)
GFR, EST AFRICAN AMERICAN: 91 mL/min/{1.73_m2} (ref >59)
GLUCOSE: 85 mg/dL (ref 65–99)
Globulin, Total: 3.5 g/dL (ref 1.5–4.5)
Potassium: 4.2 mmol/L (ref 3.5–5.2)
Sodium: 140 mmol/L (ref 134–144)
TOTAL PROTEIN: 8 g/dL (ref 6.0–8.5)

## 2017-09-21 LAB — LIPID PANEL
CHOLESTEROL TOTAL: 182 mg/dL (ref 100–199)
Chol/HDL Ratio: 3.3 ratio (ref 0.0–5.0)
HDL: 55 mg/dL (ref >39)
LDL CALC: 118 mg/dL — AB (ref 0–99)
Triglycerides: 45 mg/dL (ref 0–149)
VLDL Cholesterol Cal: 9 mg/dL (ref 5–40)

## 2017-09-21 LAB — TSH: TSH: 4.12 u[IU]/mL (ref 0.450–4.500)

## 2017-11-01 ENCOUNTER — Ambulatory Visit: Payer: BLUE CROSS/BLUE SHIELD | Admitting: Family Medicine

## 2017-11-01 ENCOUNTER — Encounter: Payer: Self-pay | Admitting: Family Medicine

## 2017-11-01 VITALS — BP 110/68 | HR 65 | Temp 97.4°F | Ht 70.0 in | Wt 183.0 lb

## 2017-11-01 DIAGNOSIS — B356 Tinea cruris: Secondary | ICD-10-CM | POA: Diagnosis not present

## 2017-11-01 MED ORDER — FLUCONAZOLE 150 MG PO TABS: 150.0000 mg | ORAL_TABLET | ORAL | 0 refills | Status: DC

## 2017-11-01 NOTE — Progress Notes (Signed)
Ht 5\' 10"  (1.778 m)   Wt 183 lb (83 kg)   BMI 26.26 kg/m    Subjective:    Patient ID: Robert [name], male    DOB: Jul 06, 1955, 62 y.o.   MRN: [id number]  HPI: Robert [name] is a 62 y.o. male presenting on 11/01/2017 for Rash   HPI Rash Patient comes in complaining of rash in his left groin that he says like a jock itch.  He has been having it off and on especially over the summer when his been hot.  It is starting to cool down finally and it has been improving.  He is using creams and powders for it but he says is not getting better completely and is wondering if there is something else he can do that stronger for.  He denies any fevers or chills or redness or warmth anywhere else but has had a lot of pruritus and itching and irritation especially in that left groin area.  He denies any discharge  Relevant past medical, surgical, family and social history reviewed and updated as indicated. Interim medical history since our last visit reviewed. Allergies and medications reviewed and updated.  Review of Systems  Constitutional: Negative for chills and fever.  Respiratory: Negative for shortness of breath and wheezing.   Cardiovascular: Negative for chest pain and leg swelling.  Musculoskeletal: Negative for back pain and gait problem.  Skin: Positive for rash.  All other systems reviewed and are negative.   Per HPI unless specifically indicated above   Allergies as of 11/01/2017   No Known Allergies     Medication List        Accurate as of 11/01/17 11:39 AM. Always use your most recent med list.          fluconazole 150 MG tablet Commonly known as:  DIFLUCAN Take 1 tablet (150 mg total) by mouth once a week.   levothyroxine 75 MCG tablet Commonly known as:  SYNTHROID, LEVOTHROID Take 1 tablet (75 mcg total) by mouth daily.   nystatin powder Commonly known as:  MYCOSTATIN/NYSTOP Apply topically 4 (four) times daily.          Objective:    Ht 5\' 10"  (1.778  m)   Wt 183 lb (83 kg)   BMI 26.26 kg/m   Wt Readings from Last 3 Encounters:  11/01/17 183 lb (83 kg)  09/20/17 183 lb 9.6 oz (83.3 kg)  03/15/17 189 lb (85.7 kg)    Physical Exam  Constitutional: He is oriented to person, place, and time. He appears well-developed and well-nourished. No distress.  Eyes: Conjunctivae are normal. No scleral icterus.  Neurological: He is alert and oriented to person, place, and time. Coordination normal.  Skin: Skin is warm and dry. Rash noted. He is not diaphoretic.  Psychiatric: He has a normal mood and affect. His behavior is normal.  Nursing note and vitals reviewed.      Assessment & Plan:   Problem List Items Addressed This Visit    None    Visit Diagnoses    Jock itch    -  Primary   Relevant Medications   fluconazole (DIFLUCAN) 150 MG tablet      Continue with jock itch creams and powders but also gave him 2 doses of Diflucan to take 1 today in 1 week from today to help with this.  Trying to stay dry and clean down there and pat dry as frequently as he can. Follow up plan: Return if symptoms  worsen or fail to improve.  Counseling provided for all of the vaccine components No orders of the defined types were placed in this encounter.   Arville Care, MD [facility] 11/01/2017, 11:39 AM

## 2017-11-29 ENCOUNTER — Encounter: Payer: Self-pay | Admitting: Family Medicine

## 2017-11-29 ENCOUNTER — Ambulatory Visit: Payer: BLUE CROSS/BLUE SHIELD | Admitting: Family Medicine

## 2017-11-29 VITALS — BP 145/83 | HR 65 | Temp 97.1°F | Ht 70.0 in | Wt 183.6 lb

## 2017-11-29 DIAGNOSIS — I739 Peripheral vascular disease, unspecified: Secondary | ICD-10-CM | POA: Diagnosis not present

## 2017-11-29 DIAGNOSIS — G5792 Unspecified mononeuropathy of left lower limb: Secondary | ICD-10-CM

## 2017-11-29 DIAGNOSIS — R030 Elevated blood-pressure reading, without diagnosis of hypertension: Secondary | ICD-10-CM | POA: Diagnosis not present

## 2017-11-29 MED ORDER — METHYLPREDNISOLONE ACETATE 80 MG/ML IJ SUSP
80.0000 mg | Freq: Once | INTRAMUSCULAR | Status: AC
  Administered 2017-11-29: 80 mg via INTRAMUSCULAR

## 2017-11-29 NOTE — Progress Notes (Signed)
BP (!) 145/83   Pulse 65   Temp (!) 97.1 F (36.2 C) (Oral)   Ht 5\' 10"  (1.778 m)   Wt 183 lb 9.6 oz (83.3 kg)   BMI 26.34 kg/m    Subjective:    Patient ID: Robert [name], male    DOB: September 09, 1955, 62 y.o.   MRN: [id number]  HPI: Robert [name] is a 62 y.o. male presenting on 11/29/2017 for Hypertension (Patient states this morning his bp was 160/something) and Leg Pain (Left leg- patient states that it feels like she don't have any circulation x 1 day)   HPI Patient is coming in complaining of left leg pain that feels like a crampy/tingling and burning sensation that has been going on over the past day.  He says he has felt like this once before and he feels like it circulation problems that he has concerns about.  He also feels like he could have nerve pain but does not really know what is going on.  He denies any swelling in that foot.  She also says his blood pressure is been elevated recently it is coming in to get that checked out.  Today's blood pressure is 145/83.  He denies any chest pain or blurred vision.  Relevant past medical, surgical, family and social history reviewed and updated as indicated. Interim medical history since our last visit reviewed. Allergies and medications reviewed and updated.  Review of Systems  Constitutional: Negative for chills and fever.  Eyes: Negative for visual disturbance.  Respiratory: Negative for shortness of breath and wheezing.   Cardiovascular: Negative for chest pain and leg swelling.  Musculoskeletal: Positive for myalgias. Negative for arthralgias, back pain and gait problem.  Skin: Negative for rash.  Neurological: Positive for numbness. Negative for dizziness, weakness, light-headedness and headaches.  All other systems reviewed and are negative.   Per HPI unless specifically indicated above   Allergies as of 11/29/2017   No Known Allergies     Medication List        Accurate as of 11/29/17 11:59 PM. Always use your most  recent med list.          levothyroxine 75 MCG tablet Commonly known as:  SYNTHROID, LEVOTHROID Take 1 tablet (75 mcg total) by mouth daily.          Objective:    BP (!) 145/83   Pulse 65   Temp (!) 97.1 F (36.2 C) (Oral)   Ht 5\' 10"  (1.778 m)   Wt 183 lb 9.6 oz (83.3 kg)   BMI 26.34 kg/m   Wt Readings from Last 3 Encounters:  11/29/17 183 lb 9.6 oz (83.3 kg)  11/01/17 183 lb (83 kg)  09/20/17 183 lb 9.6 oz (83.3 kg)    Physical Exam  Constitutional: He is oriented to person, place, and time. He appears well-developed and well-nourished. No distress.  Eyes: Conjunctivae are normal. No scleral icterus.  Neck: Neck supple. No thyromegaly present.  Cardiovascular: Normal rate, regular rhythm, normal heart sounds and intact distal pulses.  No murmur heard. Pulmonary/Chest: Effort normal and breath sounds normal. No respiratory distress. He has no wheezes.  Musculoskeletal: Normal range of motion. He exhibits no edema or tenderness (No tenderness noted on exam).  Lymphadenopathy:    He has no cervical adenopathy.  Neurological: He is alert and oriented to person, place, and time. Coordination normal.  Skin: Skin is warm and dry. No rash noted. He is not diaphoretic.  Psychiatric: He has a normal  mood and affect. His behavior is normal.  Nursing note and vitals reviewed.   Patient has normal ABIs in both lower extremities, think it is more neurogenic pain then vascular    Assessment & Plan:   Problem List Items Addressed This Visit    None    Visit Diagnoses    Neuropathy of left lower extremity    -  Primary   Relevant Medications   methylPREDNISolone acetate (DEPO-MEDROL) injection 80 mg (Completed)   Elevated BP without diagnosis of hypertension       Left leg claudication (HCC)        To try and do an anti-inflammatory for the neuropathy and see if it improves and if not may need to go to neurology in the future.  Follow up plan: Return if symptoms worsen  or fail to improve.  Counseling provided for all of the vaccine components No orders of the defined types were placed in this encounter.   Arville Care, MD [facility] 12/04/2017, 9:08 PM

## 2017-12-06 ENCOUNTER — Telehealth: Payer: Self-pay | Admitting: *Deleted

## 2017-12-06 NOTE — Telephone Encounter (Signed)
Pt is having continued L leg pain Pt requesting RX Please advise Offered appt but pt unable tocome in Please advise

## 2017-12-10 NOTE — Telephone Encounter (Signed)
Patient states his leg pain has gotten better but will let us know if it starts bothering him again.

## 2017-12-12 NOTE — Congregational Nurse Program (Unsigned)
  Dept: [phone]   Congregational Nurse Program Note  Date of Encounter: 12/12/2017  Past Medical History: Past Medical History:  Diagnosis Date  . Thyroid disease     Encounter Details: CNP Questionnaire - 12/12/17 1115      Questionnaire   Patient Status  Not Applicable    Race  Black or African American    Location Patient Served At  [id number]    Insurance  Private Insurance    Food  No food insecurities    Housing/Utilities  Yes, have permanent housing    Transportation  No transportation needs    Interpersonal Safety  Yes, feel physically and emotionally safe where you currently live    Medication  No medication insecurities    Referrals  Not Applicable    ED Visit Averted  Not Applicable    Life-Saving Intervention Made  Not Applicable      11-21/19  Check B/P since his problems with left leg, B/Phas been elevated Saw PCP and got cortisone injection.  B/p 143/86 Pulse 86.Marland Kitchen.Marland Kitchen.Cecilie KicksLeanna , RN [phone].  B/p rechecked @ 210 PM  B/P 122/78 P66 Advised to stop worrying. L. Hart Rochester, RN

## 2017-12-18 ENCOUNTER — Encounter: Payer: Self-pay | Admitting: Family Medicine

## 2017-12-18 ENCOUNTER — Ambulatory Visit: Payer: BLUE CROSS/BLUE SHIELD | Admitting: Family Medicine

## 2017-12-18 VITALS — BP 133/80 | HR 62 | Temp 96.8°F | Ht 70.0 in | Wt 181.6 lb

## 2017-12-18 DIAGNOSIS — E890 Postprocedural hypothyroidism: Secondary | ICD-10-CM

## 2017-12-18 DIAGNOSIS — G5792 Unspecified mononeuropathy of left lower limb: Secondary | ICD-10-CM | POA: Diagnosis not present

## 2017-12-18 DIAGNOSIS — R0981 Nasal congestion: Secondary | ICD-10-CM

## 2017-12-18 MED ORDER — FLUTICASONE PROPIONATE 50 MCG/ACT NA SUSP: 1.0000 | Freq: Two times a day (BID) | NASAL | 6 refills | Status: DC | PRN

## 2017-12-18 MED ORDER — METHYLPREDNISOLONE ACETATE 80 MG/ML IJ SUSP
80.0000 mg | Freq: Once | INTRAMUSCULAR | Status: AC
  Administered 2017-12-18: 80 mg via INTRAMUSCULAR

## 2017-12-18 NOTE — Progress Notes (Signed)
BP 133/80   Pulse 62   Temp (!) 96.8 F (36 C) (Oral)   Ht 5\' 10"  (1.778 m)   Wt 181 lb 9.6 oz (82.4 kg)   BMI 26.06 kg/m    Subjective:    Patient ID: Robert [name], male    DOB: [birth year], 62 y.o.   MRN: [id number]  HPI: Robert [name] is a 62 y.o. male presenting on 12/18/2017 for Thyroid Problem (Patient would like his TSH checked. He c/o fluttering of his heart and still having on and off left lower leg neuropathy)   HPI Hypothyroidism recheck Patient is coming in for thyroid recheck today as well. They deny any issues with hair changes or heat or cold problems or diarrhea or constipation. They are currently on levothyroxine 75 micrograms.  Patient comes in complaining of palpitations that have been increased over the past couple weeks and had a day just recently that was drastically increased and he had the palpitations all night.  He says is not having them currently and this day was about 4 days ago that he had a lot of them.  He denies any chest pain or shortness of breath but just has the palpitations sometimes.  Nerve pain and left leg Patient comes in continued complaints of left leg nerve pain that he attributes to sciatica, he had a steroid injection the last time and it was greatly improved but it came back about a month later.  He denies any numbness or weakness in that leg but just has the shooting nerve pain at times.  Patient complains of sore throat and sinus drainage and congestion that has been also going on over the past couple weeks.  He was concerned about his thyroid and not noted to be related to that.  He has not taken anything for it as of this point.  He denies any fevers or chills or shortness of breath or wheezing.  He does have some sinus pressure and congestion in his face as well as the sore throat.  Relevant past medical, surgical, family and social history reviewed and updated as indicated. Interim medical history since our last visit  reviewed. Allergies and medications reviewed and updated.  Review of Systems  Constitutional: Negative for chills and fever.  HENT: Positive for congestion, postnasal drip, rhinorrhea, sinus pressure and sore throat. Negative for ear discharge, ear pain, sneezing and voice change.   Eyes: Negative for pain, discharge, redness and visual disturbance.  Respiratory: Positive for cough. Negative for shortness of breath and wheezing.   Cardiovascular: Negative for chest pain and leg swelling.  Musculoskeletal: Positive for back pain. Negative for gait problem.  Skin: Negative for rash.  Neurological: Negative for dizziness, weakness, numbness and headaches.  All other systems reviewed and are negative.   Per HPI unless specifically indicated above   Allergies as of 12/18/2017   No Known Allergies     Medication List        Accurate as of 12/18/17  3:38 PM. Always use your most recent med list.          fluticasone 50 MCG/ACT nasal spray Commonly known as:  FLONASE Place 1 spray into both nostrils 2 (two) times daily as needed for allergies or rhinitis.   levothyroxine 75 MCG tablet Commonly known as:  SYNTHROID, LEVOTHROID Take 1 tablet (75 mcg total) by mouth daily.          Objective:    BP 133/80   Pulse 62   Temp (!)  96.8 F (36 C) (Oral)   Ht 5\' 10"  (1.778 m)   Wt 181 lb 9.6 oz (82.4 kg)   BMI 26.06 kg/m   Wt Readings from Last 3 Encounters:  12/18/17 181 lb 9.6 oz (82.4 kg)  11/29/17 183 lb 9.6 oz (83.3 kg)  11/01/17 183 lb (83 kg)    Physical Exam  Constitutional: He is oriented to person, place, and time. He appears well-developed and well-nourished. No distress.  HENT:  Right Ear: Tympanic membrane, external ear and ear canal normal.  Left Ear: Tympanic membrane, external ear and ear canal normal.  Nose: Mucosal edema and rhinorrhea present. No sinus tenderness. No epistaxis. Right sinus exhibits maxillary sinus tenderness. Right sinus exhibits no  frontal sinus tenderness. Left sinus exhibits maxillary sinus tenderness. Left sinus exhibits no frontal sinus tenderness.  Mouth/Throat: Uvula is midline and mucous membranes are normal. Posterior oropharyngeal edema and posterior oropharyngeal erythema present. No oropharyngeal exudate or tonsillar abscesses.  Eyes: Conjunctivae are normal. No scleral icterus.  Neck: Neck supple. No thyromegaly present.  Cardiovascular: Normal rate, regular rhythm, normal heart sounds and intact distal pulses.  No murmur heard. Pulmonary/Chest: Effort normal and breath sounds normal. No respiratory distress. He has no wheezes. He has no rales.  Musculoskeletal: Normal range of motion. He exhibits no edema.       Lumbar back: He exhibits normal range of motion, no tenderness, no bony tenderness, no swelling, no pain, no spasm and normal pulse.  Lymphadenopathy:    He has no cervical adenopathy.  Neurological: He is alert and oriented to person, place, and time. Coordination normal.  Skin: Skin is warm and dry. No rash noted. He is not diaphoretic.  Psychiatric: He has a normal mood and affect. His behavior is normal.  Nursing note and vitals reviewed.       Assessment & Plan:   Problem List Items Addressed This Visit      Endocrine   Hypothyroidism following radioiodine therapy - Primary   Relevant Orders   TSH    Other Visit Diagnoses    Neuropathy of left lower extremity       Relevant Medications   methylPREDNISolone acetate (DEPO-MEDROL) injection 80 mg (Start on 12/18/2017  3:45 PM)   Other Relevant Orders   TSH   Vitamin B12   Sinus congestion       Relevant Medications   fluticasone (FLONASE) 50 MCG/ACT nasal spray       Follow up plan: Return if symptoms worsen or fail to improve.  Counseling provided for all of the vaccine components Orders Placed This Encounter  Procedures  . TSH  . Vitamin B12    Arville CareJoshua Jorden Mahl, MD [facility] 12/18/2017, 3:38  PM

## 2017-12-19 LAB — TSH: TSH: 4.64 u[IU]/mL — ABNORMAL HIGH (ref 0.450–4.500)

## 2017-12-19 LAB — VITAMIN B12: Vitamin B-12: 715 pg/mL (ref 232–1245)

## 2017-12-20 ENCOUNTER — Other Ambulatory Visit: Payer: Self-pay | Admitting: *Deleted

## 2017-12-20 MED ORDER — LEVOTHYROXINE SODIUM 88 MCG PO TABS: 88.0000 ug | ORAL_TABLET | Freq: Every day | ORAL | 1 refills | Status: DC

## 2018-02-18 NOTE — Congregational Nurse Program (Signed)
  Dept: [phone]   Congregational Nurse Program Note  Date of Encounter: 02/18/2018  Past Medical History: Past Medical History:  Diagnosis Date  . Thyroid disease     Encounter Details: CNP Questionnaire - 01/24/18 1120      Questionnaire   Patient Status  Not Applicable    Race  Black or African American    Location Patient Served At  [employer]    Uninsured  Not Applicable    Food  No food insecurities    Housing/Utilities  Yes, have permanent housing    Transportation  No transportation needs    Interpersonal Safety  Yes, feel physically and emotionally safe where you currently live    Medication  No medication insecurities    Medical Provider  Yes    Referrals  Not Applicable   Sees PCP regularly   ED Visit Averted  Not Applicable    Life-Saving Intervention Made  Not Applicable     01/24/18 Want B/P and Blood Sugar checked. States his blood pressure is running higher since his thyroid meds were changed. B/p 145/84 Pulse 78, Blood Sugar 82.  Blood Pressure higher than usual.Advised to checked with his doctor.  States is is to see dr. This week. [name]

## 2018-03-17 ENCOUNTER — Other Ambulatory Visit: Payer: Self-pay | Admitting: *Deleted

## 2018-03-17 ENCOUNTER — Other Ambulatory Visit: Payer: BLUE CROSS/BLUE SHIELD

## 2018-03-17 DIAGNOSIS — E890 Postprocedural hypothyroidism: Secondary | ICD-10-CM

## 2018-03-18 LAB — TSH: TSH: 1.52 u[IU]/mL (ref 0.450–4.500)

## 2018-03-26 ENCOUNTER — Ambulatory Visit (INDEPENDENT_AMBULATORY_CARE_PROVIDER_SITE_OTHER): Payer: BLUE CROSS/BLUE SHIELD | Admitting: Family Medicine

## 2018-03-26 ENCOUNTER — Encounter: Payer: Self-pay | Admitting: Family Medicine

## 2018-03-26 VITALS — BP 123/75 | HR 69 | Temp 97.1°F | Ht 70.0 in | Wt 179.0 lb

## 2018-03-26 DIAGNOSIS — K641 Second degree hemorrhoids: Secondary | ICD-10-CM | POA: Diagnosis not present

## 2018-03-26 DIAGNOSIS — B356 Tinea cruris: Secondary | ICD-10-CM | POA: Diagnosis not present

## 2018-03-26 MED ORDER — FLUCONAZOLE 150 MG PO TABS: 150.0000 mg | ORAL_TABLET | ORAL | 0 refills | Status: DC

## 2018-03-26 NOTE — Progress Notes (Signed)
BP 123/75   Pulse 69   Temp (!) 97.1 F (36.2 C) (Oral)   Ht 5\' 10"  (1.778 m)   Wt 179 lb (81.2 kg)   BMI 25.68 kg/m    Subjective:    Patient ID: Robert [name], male    DOB: 06-01-1955, 63 y.o.   MRN: [id number]  HPI: Robert [name] is a 63 y.o. male presenting on 03/26/2018 for Personal Problem   HPI Patient comes in complaining of rectal bleeding and hemorrhoids that he wants to have checked and still having the jock itch and rash in his groin.  He says he has been using the topical cream for the rash in his groin and he has been using a hemorrhoid cream for his rectal area and has not had as much bleeding but just wanted to get it checked out.  He denies any fevers or chills or rash spreading anywhere else except for his groin.  He denies any blood in the stool but just more blood on the toilet paper and streaking.  He is already done some oral Diflucan for the rash in his groin it just does not seem to be going away.  He says the rash is very pruritic but has not had any drainage or redness or warmth  Relevant past medical, surgical, family and social history reviewed and updated as indicated. Interim medical history since our last visit reviewed. Allergies and medications reviewed and updated.  Review of Systems  Constitutional: Negative for chills and fever.  Eyes: Negative for discharge.  Respiratory: Negative for shortness of breath and wheezing.   Cardiovascular: Negative for chest pain and leg swelling.  Gastrointestinal: Positive for anal bleeding.  Musculoskeletal: Negative for back pain and gait problem.  Skin: Positive for rash.  All other systems reviewed and are negative.   Per HPI unless specifically indicated above   Allergies as of 03/26/2018   No Known Allergies     Medication List       Accurate as of March 26, 2018 12:10 PM. Always use your most recent med list.        fluconazole 150 MG tablet Commonly known as:  DIFLUCAN Take 1 tablet (150 mg  total) by mouth once a week.   fluticasone 50 MCG/ACT nasal spray Commonly known as:  FLONASE Place 1 spray into both nostrils 2 (two) times daily as needed for allergies or rhinitis.   levothyroxine 88 MCG tablet Commonly known as:  SYNTHROID, LEVOTHROID Take 1 tablet (88 mcg total) by mouth daily.          Objective:    BP 123/75   Pulse 69   Temp (!) 97.1 F (36.2 C) (Oral)   Ht 5\' 10"  (1.778 m)   Wt 179 lb (81.2 kg)   BMI 25.68 kg/m   Wt Readings from Last 3 Encounters:  03/26/18 179 lb (81.2 kg)  12/18/17 181 lb 9.6 oz (82.4 kg)  11/29/17 183 lb 9.6 oz (83.3 kg)    Physical Exam Vitals signs and nursing note reviewed.  Constitutional:      General: He is not in acute distress.    Appearance: He is well-developed. He is not diaphoretic.  Eyes:     General: No scleral icterus.    Conjunctiva/sclera: Conjunctivae normal.  Genitourinary:    Rectum: External hemorrhoid (Very small external hemorrhoid) present.  Skin:    General: Skin is warm and dry.     Findings: Rash (Fine papular rash on inner scrotum on  both sides and inner groin on both sides.) present.  Neurological:     Mental Status: He is alert and oriented to person, place, and time.     Coordination: Coordination normal.  Psychiatric:        Behavior: Behavior normal.         Assessment & Plan:   Problem List Items Addressed This Visit    None    Visit Diagnoses    Jock itch    -  Primary   Relevant Medications   fluconazole (DIFLUCAN) 150 MG tablet   Other Relevant Orders   Ambulatory referral to Dermatology   Grade II hemorrhoids          Patient has been using creams and daily course of Diflucan in the past and it did not seem to resolve, it is very pruritic, will refer to dermatology because of the persistence and nonresolution of it.  Patient's hemorrhoids have improved since he started getting back on the MiraLAX and having normal bowel movements, he is using hemorrhoid creams and  they are not bleeding as much, he will continue with those and let us know if it worsens. Follow up plan: Return if symptoms worsen or fail to improve.  Counseling provided for all of the vaccine components Orders Placed This Encounter  Procedures  . Ambulatory referral to Dermatology    Arville Care, MD [facility] 03/26/2018, 12:10 PM

## 2018-06-11 ENCOUNTER — Other Ambulatory Visit: Payer: Self-pay | Admitting: Family Medicine

## 2018-07-10 ENCOUNTER — Other Ambulatory Visit: Payer: Self-pay | Admitting: Family Medicine

## 2018-07-16 ENCOUNTER — Other Ambulatory Visit: Payer: BLUE CROSS/BLUE SHIELD

## 2018-07-16 ENCOUNTER — Encounter: Payer: Self-pay | Admitting: Family Medicine

## 2018-07-16 ENCOUNTER — Ambulatory Visit (INDEPENDENT_AMBULATORY_CARE_PROVIDER_SITE_OTHER): Payer: BC Managed Care – PPO | Admitting: Family Medicine

## 2018-07-16 ENCOUNTER — Other Ambulatory Visit: Payer: Self-pay

## 2018-07-16 ENCOUNTER — Telehealth: Payer: Self-pay | Admitting: *Deleted

## 2018-07-16 DIAGNOSIS — Z20822 Contact with and (suspected) exposure to covid-19: Secondary | ICD-10-CM

## 2018-07-16 DIAGNOSIS — H66002 Acute suppurative otitis media without spontaneous rupture of ear drum, left ear: Secondary | ICD-10-CM

## 2018-07-16 DIAGNOSIS — R6889 Other general symptoms and signs: Secondary | ICD-10-CM | POA: Diagnosis not present

## 2018-07-16 MED ORDER — AMOXICILLIN-POT CLAVULANATE 875-125 MG PO TABS: 1.0000 | ORAL_TABLET | Freq: Two times a day (BID) | ORAL | 0 refills | Status: DC

## 2018-07-16 NOTE — Telephone Encounter (Addendum)
Contacted pt to schedule testing; pt accepted appointment at [facility] site 07/16/2018; pt given address, locations, and instructions that he and all occupants of his vehicle should wear masks; he verbalized understanding; orders placed per protocol.   ----- Message from Worthy Rancher, MD sent at 07/16/2018 12:25 PM EDT ----- Recommended for patient to go testing based on symptoms of sinus and ear problems

## 2018-07-16 NOTE — Progress Notes (Signed)
Virtual Visit via telephone Note  I connected with Robert [name] on 07/16/18 at 1216 by telephone and verified that I am speaking with the correct person using two identifiers. Robert [name] is currently located at home and no other people are currently with her during visit. The provider, Fransisca Kaufmann , MD is located in their office at time of visit.  Call ended at 1225  I discussed the limitations, risks, security and privacy concerns of performing an evaluation and management service by telephone and the availability of in person appointments. I also discussed with the patient that there may be a patient responsible charge related to this service. The patient expressed understanding and agreed to proceed.   History and Present Illness: Patient is calling in for left ear pain.  Patient has been using debrox for his left ear and it feels like he has a toothache on the left side that has come to the right side now.  Tylenol and Flonase have helped some. He has some nasal drainage and sinus drainage.  He denies any fevers or chills or shortness of breath.  He denies any sick contacts with anyone that has coronavirus.  No diagnosis found.  Outpatient Encounter Medications as of 07/16/2018  Medication Sig  . fluconazole (DIFLUCAN) 150 MG tablet Take 1 tablet (150 mg total) by mouth once a week.  . fluticasone (FLONASE) 50 MCG/ACT nasal spray Place 1 spray into both nostrils 2 (two) times daily as needed for allergies or rhinitis.  Marland Kitchen levothyroxine (SYNTHROID) 88 MCG tablet Take 1 tablet (88 mcg total) by mouth daily before breakfast.  . [DISCONTINUED] diphenhydrAMINE (BENADRYL) 25 MG tablet Take 25-50 mg by mouth daily as needed. For allergic reaction  . [DISCONTINUED] fexofenadine (ALLEGRA) 180 MG tablet 1 po qAM for hives and itching   No facility-administered encounter medications on file as of 07/16/2018.     Review of Systems  Constitutional: Negative for chills and fever.  HENT:  Positive for congestion, ear pain, postnasal drip, rhinorrhea, sinus pressure, sinus pain, sneezing and sore throat. Negative for ear discharge and voice change.   Eyes: Negative for pain, discharge, redness and visual disturbance.  Respiratory: Negative for cough, shortness of breath and wheezing.   Cardiovascular: Negative for chest pain and leg swelling.  Musculoskeletal: Negative for gait problem.  Skin: Negative for rash.  All other systems reviewed and are negative.   Observations/Objective: Patient sounds comfortable and in no acute distress  Assessment and Plan: Problem List Items Addressed This Visit    None    Visit Diagnoses    Non-recurrent acute suppurative otitis media of left ear without spontaneous rupture of tympanic membrane    -  Primary   Relevant Medications   amoxicillin-clavulanate (AUGMENTIN) 875-125 MG tablet   Other Relevant Orders   MYCHART COVID-19 HOME MONITORING PROGRAM   MyChart Temperature FLOWSHEET       Follow Up Instructions: Will send for covid testing  Treat like sinus infection  Recommend quarantine   I discussed the assessment and treatment plan with the patient. The patient was provided an opportunity to ask questions and all were answered. The patient agreed with the plan and demonstrated an understanding of the instructions.   The patient was advised to call back or seek an in-person evaluation if the symptoms worsen or if the condition fails to improve as anticipated.  The above assessment and management plan was discussed with the patient. The patient verbalized understanding of and has agreed to the  management plan. Patient is aware to call the clinic if symptoms persist or worsen. Patient is aware when to return to the clinic for a follow-up visit. Patient educated on when it is appropriate to go to the emergency department.    I provided 9 minutes of non-face-to-face time during this encounter.    Nils PyleJoshua A , MD

## 2018-07-21 LAB — NOVEL CORONAVIRUS, NAA: SARS-CoV-2, NAA: NOT DETECTED

## 2018-08-20 ENCOUNTER — Other Ambulatory Visit: Payer: Self-pay

## 2018-08-21 ENCOUNTER — Encounter: Payer: Self-pay | Admitting: Family Medicine

## 2018-08-21 ENCOUNTER — Ambulatory Visit: Payer: BC Managed Care – PPO | Admitting: Family Medicine

## 2018-08-21 VITALS — BP 146/86 | HR 55 | Temp 98.4°F | Ht 70.0 in | Wt 185.6 lb

## 2018-08-21 DIAGNOSIS — E890 Postprocedural hypothyroidism: Secondary | ICD-10-CM

## 2018-08-21 NOTE — Progress Notes (Signed)
BP (!) 146/86   Pulse (!) 55   Temp 98.4 F (36.9 C) (Temporal)   Ht 5\' 10"  (1.778 m)   Wt 185 lb 9.6 oz (84.2 kg)   BMI 26.63 kg/m    Subjective:   Patient ID: Robert [name], male    DOB: 21-Feb-1955, 63 y.o.   MRN: [id number]  HPI: Robert [name] is a 63 y.o. male presenting on 08/21/2018 for Hypothyroidism (check up of chronic medical conditions)   HPI Hypothyroidism recheck Patient is coming in for thyroid recheck today as well.  Patient says that he is currently been having decreased energy and his mind is a fog and his blood pressures are up and his sciatic nerve is acting up and that he is what happens when his thyroid is off and he wanted to get it checked today. They are currently on levothyroxine 88micrograms   Relevant past medical, surgical, family and social history reviewed and updated as indicated. Interim medical history since our last visit reviewed. Allergies and medications reviewed and updated.  Review of Systems  Constitutional: Positive for fatigue. Negative for chills and fever.  Eyes: Negative for visual disturbance.  Respiratory: Negative for shortness of breath and wheezing.   Cardiovascular: Negative for chest pain and leg swelling.  Musculoskeletal: Negative for back pain and gait problem.  Skin: Negative for rash.  Neurological: Negative for dizziness, weakness and light-headedness.  Psychiatric/Behavioral: Positive for decreased concentration.  All other systems reviewed and are negative.   Per HPI unless specifically indicated above   Allergies as of 08/21/2018   No Known Allergies     Medication List       Accurate as of August 21, 2018 10:08 AM. If you have any questions, ask your nurse or doctor.        STOP taking these medications   amoxicillin-clavulanate 875-125 MG tablet Commonly known as: AUGMENTIN Stopped by: Elige RadonJoshua A , MD     TAKE these medications   fluticasone 50 MCG/ACT nasal spray Commonly known as: FLONASE  Place 1 spray into both nostrils 2 (two) times daily as needed for allergies or rhinitis.   levothyroxine 88 MCG tablet Commonly known as: SYNTHROID Take 1 tablet (88 mcg total) by mouth daily before breakfast.        Objective:   BP (!) 146/86   Pulse (!) 55   Temp 98.4 F (36.9 C) (Temporal)   Ht 5\' 10"  (1.778 m)   Wt 185 lb 9.6 oz (84.2 kg)   BMI 26.63 kg/m   Wt Readings from Last 3 Encounters:  08/21/18 185 lb 9.6 oz (84.2 kg)  03/26/18 179 lb (81.2 kg)  12/18/17 181 lb 9.6 oz (82.4 kg)    Physical Exam Vitals signs and nursing note reviewed.  Constitutional:      General: He is not in acute distress.    Appearance: He is well-developed. He is not diaphoretic.  Eyes:     General: No scleral icterus.    Conjunctiva/sclera: Conjunctivae normal.  Neck:     Musculoskeletal: Neck supple.     Thyroid: No thyromegaly.  Cardiovascular:     Rate and Rhythm: Normal rate and regular rhythm.     Heart sounds: Normal heart sounds. No murmur.  Pulmonary:     Effort: Pulmonary effort is normal. No respiratory distress.     Breath sounds: Normal breath sounds. No wheezing.  Lymphadenopathy:     Cervical: No cervical adenopathy.  Skin:    General: Skin is warm  and dry.     Findings: No rash.  Neurological:     Mental Status: He is alert and oriented to person, place, and time.     Coordination: Coordination normal.  Psychiatric:        Behavior: Behavior normal.       Assessment & Plan:   Problem List Items Addressed This Visit      Endocrine   Hypothyroidism following radioiodine therapy - Primary      Recheck thyroid today and then will assess from there. Follow up plan: Return in about 2 months (around 10/22/2018), or if symptoms worsen or fail to improve, for Annual physical, he says he Artie has an in place.  Counseling provided for all of the vaccine components No orders of the defined types were placed in this encounter.   Caryl Pina, MD  [facility] 08/21/2018, 10:08 AM

## 2018-08-21 NOTE — Addendum Note (Signed)
Addended by: Caryl Pina on: 08/21/2018 10:34 AM   Modules accepted: Orders

## 2018-08-22 LAB — THYROID PANEL WITH TSH
Free Thyroxine Index: 2.4 (ref 1.2–4.9)
T3 Uptake Ratio: 28 % (ref 24–39)
T4, Total: 8.6 ug/dL (ref 4.5–12.0)
TSH: 3.42 u[IU]/mL (ref 0.450–4.500)

## 2018-08-28 ENCOUNTER — Encounter: Payer: Self-pay | Admitting: Family Medicine

## 2018-08-28 ENCOUNTER — Ambulatory Visit (INDEPENDENT_AMBULATORY_CARE_PROVIDER_SITE_OTHER): Payer: BC Managed Care – PPO | Admitting: Family Medicine

## 2018-08-28 DIAGNOSIS — J0100 Acute maxillary sinusitis, unspecified: Secondary | ICD-10-CM | POA: Diagnosis not present

## 2018-08-28 MED ORDER — AMOXICILLIN-POT CLAVULANATE 875-125 MG PO TABS: 1.0000 | ORAL_TABLET | Freq: Two times a day (BID) | ORAL | 0 refills | Status: DC

## 2018-08-28 MED ORDER — PREDNISONE 10 MG PO TABS: ORAL_TABLET | ORAL | 0 refills | Status: DC

## 2018-08-28 NOTE — Progress Notes (Signed)
Subjective:    Patient ID: Robert [name], male    DOB: April 04, 1955, 63 y.o.   MRN: [id number]   HPI: Robert [name] is a 63 y.o. male presenting for Symptoms include congestion, facial pain, nasal congestion, non productive cough, post nasal drip and sinus pressure. Teeth hurt. There is no fever, chills, or sweats. Onset of symptoms was last night, gradually worsening since that time. Had similar sx 2 months ago.     Depression screen [facility] 2/9 08/21/2018 03/26/2018 12/18/2017 11/29/2017 11/01/2017  Decreased Interest 0 0 0 0 0  Down, Depressed, Hopeless 0 0 0 0 0  PHQ - 2 Score 0 0 0 0 0     Relevant past medical, surgical, family and social history reviewed and updated as indicated.  Interim medical history since our last visit reviewed. Allergies and medications reviewed and updated.  ROS:  Review of Systems  Constitutional: Negative for activity change, appetite change, chills and fever.  HENT: Positive for congestion, postnasal drip, rhinorrhea and sinus pressure. Negative for ear discharge, ear pain, hearing loss, nosebleeds, sneezing and trouble swallowing.   Respiratory: Negative for chest tightness and shortness of breath.   Cardiovascular: Negative for chest pain and palpitations.  Skin: Negative for rash.     Social History   Tobacco Use  Smoking Status Former Smoker  . Packs/day: 0.25  . Types: Cigarettes  . Start date: 12/08/2000  . Quit date: 12/09/2010  . Years since quitting: 7.7  Smokeless Tobacco Never Used       Objective:     Wt Readings from Last 3 Encounters:  08/21/18 185 lb 9.6 oz (84.2 kg)  03/26/18 179 lb (81.2 kg)  12/18/17 181 lb 9.6 oz (82.4 kg)     Exam deferred. Pt. Harboring due to COVID 19. Phone visit performed.   Assessment & Plan:   1. Acute maxillary sinusitis, recurrence not specified     Meds ordered this encounter  Medications  . amoxicillin-clavulanate (AUGMENTIN) 875-125 MG tablet    Sig: Take 1 tablet by mouth 2 (two)  times daily. Take all of this medication    Dispense:  20 tablet    Refill:  0  . predniSONE (DELTASONE) 10 MG tablet    Sig: Take 5 daily for 2 days followed by 4,3,2 and 1 for 2 days each.    Dispense:  30 tablet    Refill:  0    No orders of the defined types were placed in this encounter.     Diagnoses and all orders for this visit:  Acute maxillary sinusitis, recurrence not specified  Other orders -     amoxicillin-clavulanate (AUGMENTIN) 875-125 MG tablet; Take 1 tablet by mouth 2 (two) times daily. Take all of this medication -     predniSONE (DELTASONE) 10 MG tablet; Take 5 daily for 2 days followed by 4,3,2 and 1 for 2 days each.    Virtual Visit via telephone Note  I discussed the limitations, risks, security and privacy concerns of performing an evaluation and management service by telephone and the availability of in person appointments. The patient was identified with two identifiers. Pt.expressed understanding and agreed to proceed. Pt. Is at home. Dr. Livia Snellen is in his office.  Follow Up Instructions:   I discussed the assessment and treatment plan with the patient. The patient was provided an opportunity to ask questions and all were answered. The patient agreed with the plan and demonstrated an understanding of the instructions.   The  patient was advised to call back or seek an in-person evaluation if the symptoms worsen or if the condition fails to improve as anticipated.   Total minutes including chart review and phone contact time: 12   Follow up plan: Return if symptoms worsen or fail to improve.  Mechele ClaudeWarren Saydee Zolman, MD [facility]

## 2018-09-30 ENCOUNTER — Other Ambulatory Visit: Payer: Self-pay

## 2018-10-01 ENCOUNTER — Encounter: Payer: Self-pay | Admitting: Family Medicine

## 2018-10-01 ENCOUNTER — Other Ambulatory Visit: Payer: Self-pay

## 2018-10-01 ENCOUNTER — Ambulatory Visit (INDEPENDENT_AMBULATORY_CARE_PROVIDER_SITE_OTHER): Payer: BC Managed Care – PPO | Admitting: Family Medicine

## 2018-10-01 VITALS — BP 121/75 | HR 60 | Temp 97.3°F | Ht 70.0 in | Wt 184.8 lb

## 2018-10-01 DIAGNOSIS — Z131 Encounter for screening for diabetes mellitus: Secondary | ICD-10-CM

## 2018-10-01 DIAGNOSIS — E663 Overweight: Secondary | ICD-10-CM

## 2018-10-01 DIAGNOSIS — Z23 Encounter for immunization: Secondary | ICD-10-CM

## 2018-10-01 DIAGNOSIS — Z0001 Encounter for general adult medical examination with abnormal findings: Secondary | ICD-10-CM | POA: Diagnosis not present

## 2018-10-01 DIAGNOSIS — E890 Postprocedural hypothyroidism: Secondary | ICD-10-CM

## 2018-10-01 DIAGNOSIS — Z13220 Encounter for screening for lipoid disorders: Secondary | ICD-10-CM

## 2018-10-01 DIAGNOSIS — Z0000 Encounter for general adult medical examination without abnormal findings: Secondary | ICD-10-CM | POA: Diagnosis not present

## 2018-10-01 DIAGNOSIS — Z125 Encounter for screening for malignant neoplasm of prostate: Secondary | ICD-10-CM

## 2018-10-01 NOTE — Progress Notes (Signed)
 BP 121/75   Pulse 60   Temp (!) 97.3 F (36.3 C) (Temporal)   Ht 5' 10" (1.778 m)   Wt 184 lb 12.8 oz (83.8 kg)   SpO2 98%   BMI 26.52 kg/m    Subjective:   Patient ID: Robert [name], male    DOB: 12/14/1955, 62 y.o.   MRN: [id number]  HPI: Robert [name] is a 62 y.o. male presenting on 10/01/2018 for Annual Exam   HPI .Well exam and physical Patient is coming in today for well adult exam and physical.  He says he is feeling very healthy and denies any major health issues or concerns.  He says he is feeling at the top of his game.  He says his thyroid is doing well and he is continued to take the 88 mcg and is not on any other current medication.  He tries to stay active and he also works through the church as well. Patient denies any chest pain, shortness of breath, headaches or vision issues, abdominal complaints, diarrhea, nausea, vomiting, or joint issues.   Relevant past medical, surgical, family and social history reviewed and updated as indicated. Interim medical history since our last visit reviewed. Allergies and medications reviewed and updated.  Review of Systems  Constitutional: Negative for chills and fever.  HENT: Negative for ear pain and tinnitus.   Eyes: Negative for pain.  Respiratory: Negative for cough, shortness of breath and wheezing.   Cardiovascular: Negative for chest pain, palpitations and leg swelling.  Gastrointestinal: Negative for abdominal pain, blood in stool, constipation and diarrhea.  Genitourinary: Negative for dysuria and hematuria.  Musculoskeletal: Negative for back pain, gait problem and myalgias.  Skin: Negative for rash.  Neurological: Negative for dizziness, weakness, light-headedness and headaches.  Psychiatric/Behavioral: Negative for suicidal ideas.  All other systems reviewed and are negative.   Per HPI unless specifically indicated above   Allergies as of 10/01/2018   No Known Allergies     Medication List       Accurate as  of October 01, 2018  4:21 PM. If you have any questions, ask your nurse or doctor.        STOP taking these medications   amoxicillin-clavulanate 875-125 MG tablet Commonly known as: AUGMENTIN Stopped by: Joshua A Dettinger, MD   predniSONE 10 MG tablet Commonly known as: DELTASONE Stopped by: Joshua A Dettinger, MD     TAKE these medications   fluticasone 50 MCG/ACT nasal spray Commonly known as: FLONASE Place 1 spray into both nostrils 2 (two) times daily as needed for allergies or rhinitis.   levothyroxine 88 MCG tablet Commonly known as: SYNTHROID Take 1 tablet (88 mcg total) by mouth daily before breakfast.        Objective:   BP 121/75   Pulse 60   Temp (!) 97.3 F (36.3 C) (Temporal)   Ht 5' 10" (1.778 m)   Wt 184 lb 12.8 oz (83.8 kg)   SpO2 98%   BMI 26.52 kg/m   Wt Readings from Last 3 Encounters:  10/01/18 184 lb 12.8 oz (83.8 kg)  08/21/18 185 lb 9.6 oz (84.2 kg)  03/26/18 179 lb (81.2 kg)    Physical Exam Vitals signs and nursing note reviewed.  Constitutional:      General: He is not in acute distress.    Appearance: He is well-developed. He is not diaphoretic.  HENT:     Right Ear: External ear normal.     Left Ear: External ear   normal.     Nose: Nose normal.     Mouth/Throat:     Pharynx: No oropharyngeal exudate.  Eyes:     General: No scleral icterus.       Right eye: No discharge.     Conjunctiva/sclera: Conjunctivae normal.     Pupils: Pupils are equal, round, and reactive to light.  Neck:     Musculoskeletal: Neck supple.     Thyroid: No thyromegaly.  Cardiovascular:     Rate and Rhythm: Normal rate and regular rhythm.     Heart sounds: Normal heart sounds. No murmur.  Pulmonary:     Effort: Pulmonary effort is normal. No respiratory distress.     Breath sounds: Normal breath sounds. No wheezing.  Abdominal:     General: Bowel sounds are normal. There is no distension.     Palpations: Abdomen is soft.     Tenderness: There is  no abdominal tenderness. There is no guarding or rebound.  Genitourinary:    Prostate: Enlarged. Not tender and no nodules present.     Rectum: External hemorrhoid present. No tenderness.  Musculoskeletal: Normal range of motion.  Lymphadenopathy:     Cervical: No cervical adenopathy.  Skin:    General: Skin is warm and dry.     Findings: No rash.  Neurological:     Mental Status: He is alert and oriented to person, place, and time.     Coordination: Coordination normal.  Psychiatric:        Behavior: Behavior normal.       Assessment & Plan:   Problem List Items Addressed This Visit      Endocrine   Hypothyroidism following radioiodine therapy   Relevant Orders   CMP14+EGFR   TSH     Other   Overweight (BMI 25.0-29.9)   Relevant Orders   Lipid panel    Other Visit Diagnoses    Well adult exam    -  Primary   Relevant Orders   CMP14+EGFR   CBC with Differential/Platelet   Lipid panel   TSH   Lipid screening       Relevant Orders   CBC with Differential/Platelet   Lipid panel   Diabetes mellitus screening       Relevant Orders   CMP14+EGFR   CBC with Differential/Platelet   Prostate cancer screening       Relevant Orders   PSA, total and free      Will check blood work and check for his thyroid and his prostate exam and follow-up in 6 months Follow up plan: Return in about 6 months (around 03/31/2019), or if symptoms worsen or fail to improve, for Thyroid recheck.  Counseling provided for all of the vaccine components Orders Placed This Encounter  Procedures  . CMP14+EGFR  . CBC with Differential/Platelet  . Lipid panel  . TSH  . PSA, total and free    Caryl Pina, MD [facility] 10/01/2018, 4:21 PM

## 2018-10-02 LAB — PSA, TOTAL AND FREE
PSA, Free Pct: 17.1 %
PSA, Free: 0.41 ng/mL
Prostate Specific Ag, Serum: 2.4 ng/mL (ref 0.0–4.0)

## 2018-10-02 LAB — LIPID PANEL
Chol/HDL Ratio: 3.6 ratio (ref 0.0–5.0)
Cholesterol, Total: 195 mg/dL (ref 100–199)
HDL: 54 mg/dL (ref >39)
LDL Chol Calc (NIH): 124 mg/dL — ABNORMAL HIGH (ref 0–99)
Triglycerides: 94 mg/dL (ref 0–149)
VLDL Cholesterol Cal: 17 mg/dL (ref 5–40)

## 2018-10-02 LAB — CMP14+EGFR
ALT: 14 IU/L (ref 0–44)
AST: 31 IU/L (ref 0–40)
Albumin/Globulin Ratio: 1.4 (ref 1.2–2.2)
Albumin: 4.4 g/dL (ref 3.8–4.8)
Alkaline Phosphatase: 55 IU/L (ref 39–117)
BUN/Creatinine Ratio: 8 — ABNORMAL LOW (ref 10–24)
BUN: 8 mg/dL (ref 8–27)
Bilirubin Total: 0.6 mg/dL (ref 0.0–1.2)
CO2: 25 mmol/L (ref 20–29)
Calcium: 9.1 mg/dL (ref 8.6–10.2)
Chloride: 104 mmol/L (ref 96–106)
Creatinine, Ser: 0.96 mg/dL (ref 0.76–1.27)
GFR calc Af Amer: 96 mL/min/{1.73_m2} (ref >59)
GFR calc non Af Amer: 83 mL/min/{1.73_m2} (ref >59)
Globulin, Total: 3.1 g/dL (ref 1.5–4.5)
Glucose: 79 mg/dL (ref 65–99)
Potassium: 3.8 mmol/L (ref 3.5–5.2)
Sodium: 140 mmol/L (ref 134–144)
Total Protein: 7.5 g/dL (ref 6.0–8.5)

## 2018-10-02 LAB — CBC WITH DIFFERENTIAL/PLATELET
Basophils Absolute: 0 10*3/uL (ref 0.0–0.2)
Basos: 0 %
EOS (ABSOLUTE): 0.1 10*3/uL (ref 0.0–0.4)
Eos: 1 %
Hematocrit: 39.2 % (ref 37.5–51.0)
Hemoglobin: 12.9 g/dL — ABNORMAL LOW (ref 13.0–17.7)
Immature Grans (Abs): 0 10*3/uL (ref 0.0–0.1)
Immature Granulocytes: 0 %
Lymphocytes Absolute: 1.4 10*3/uL (ref 0.7–3.1)
Lymphs: 31 %
MCH: 29.9 pg (ref 26.6–33.0)
MCHC: 32.9 g/dL (ref 31.5–35.7)
MCV: 91 fL (ref 79–97)
Monocytes Absolute: 0.3 10*3/uL (ref 0.1–0.9)
Monocytes: 7 %
Neutrophils Absolute: 2.8 10*3/uL (ref 1.4–7.0)
Neutrophils: 61 %
Platelets: 220 10*3/uL (ref 150–450)
RBC: 4.31 x10E6/uL (ref 4.14–5.80)
RDW: 14.3 % (ref 11.6–15.4)
WBC: 4.6 10*3/uL (ref 3.4–10.8)

## 2018-10-02 LAB — TSH: TSH: 5.3 u[IU]/mL — ABNORMAL HIGH (ref 0.450–4.500)

## 2018-10-07 MED ORDER — LEVOTHYROXINE SODIUM 100 MCG PO TABS: 100.0000 ug | ORAL_TABLET | Freq: Every day | ORAL | 3 refills | Status: DC

## 2018-10-07 NOTE — Addendum Note (Signed)
Addended by: Karle Plumber on: 10/07/2018 08:33 AM   Modules accepted: Orders

## 2019-05-08 ENCOUNTER — Encounter: Payer: Self-pay | Admitting: Family Medicine

## 2019-05-08 ENCOUNTER — Ambulatory Visit (INDEPENDENT_AMBULATORY_CARE_PROVIDER_SITE_OTHER): Payer: Medicare HMO | Admitting: Family Medicine

## 2019-05-08 ENCOUNTER — Other Ambulatory Visit: Payer: Self-pay

## 2019-05-08 VITALS — BP 126/67 | Temp 98.3°F | Ht 70.0 in | Wt 186.1 lb

## 2019-05-08 DIAGNOSIS — Z23 Encounter for immunization: Secondary | ICD-10-CM

## 2019-05-08 DIAGNOSIS — Z13220 Encounter for screening for lipoid disorders: Secondary | ICD-10-CM

## 2019-05-08 DIAGNOSIS — E890 Postprocedural hypothyroidism: Secondary | ICD-10-CM

## 2019-05-08 DIAGNOSIS — M940 Chondrocostal junction syndrome [Tietze]: Secondary | ICD-10-CM

## 2019-05-08 DIAGNOSIS — Z131 Encounter for screening for diabetes mellitus: Secondary | ICD-10-CM | POA: Diagnosis not present

## 2019-05-08 NOTE — Progress Notes (Signed)
BP 126/67   Temp 98.3 F (36.8 C) (Temporal)   Ht '5\' 10"'  (1.778 m)   Wt 186 lb 2 oz (84.4 kg)   BMI 26.71 kg/m    Subjective:   Patient ID: Robert [name], male    DOB: 03/22/1955, 64 y.o.   MRN: [id number]  HPI: Robert [name] is a 64 y.o. male presenting on 05/08/2019 for Medical Management of Chronic Issues   HPI Hypothyroidism recheck Patient is coming in for thyroid recheck today as well. They deny any issues with hair changes or heat or cold problems or diarrhea or constipation. They deny any chest pain or palpitations. They are currently on levothyroxine 175mcrograms   Patient has some aches and some pains under his ribs on the lower part when he takes deep breath and coughs, he has had a little more coughing with the allergies, he says he lives in apartment but that he is cold and that does affect them sometimes.  He does use his Flonase and over-the-counter allergy medicine.  Relevant past medical, surgical, family and social history reviewed and updated as indicated. Interim medical history since our last visit reviewed. Allergies and medications reviewed and updated.  Review of Systems  Constitutional: Negative for chills and fever.  Respiratory: Negative for shortness of breath and wheezing.   Cardiovascular: Negative for chest pain and leg swelling.  Musculoskeletal: Negative for back pain and gait problem.  Skin: Negative for rash.  Neurological: Negative for dizziness, weakness and light-headedness.  All other systems reviewed and are negative.   Per HPI unless specifically indicated above   Allergies as of 05/08/2019   No Known Allergies     Medication List       Accurate as of May 08, 2019  4:21 PM. If you have any questions, ask your nurse or doctor.        fluticasone 50 MCG/ACT nasal spray Commonly known as: FLONASE Place 1 spray into both nostrils 2 (two) times daily as needed for allergies or rhinitis.   levothyroxine 100 MCG tablet Commonly  known as: SYNTHROID Take 1 tablet (100 mcg total) by mouth daily.        Objective:   BP 126/67   Temp 98.3 F (36.8 C) (Temporal)   Ht '5\' 10"'  (1.778 m)   Wt 186 lb 2 oz (84.4 kg)   BMI 26.71 kg/m   Wt Readings from Last 3 Encounters:  05/08/19 186 lb 2 oz (84.4 kg)  10/01/18 184 lb 12.8 oz (83.8 kg)  08/21/18 185 lb 9.6 oz (84.2 kg)    Physical Exam Vitals and nursing note reviewed.  Constitutional:      General: He is not in acute distress.    Appearance: He is well-developed. He is not diaphoretic.  Eyes:     General: No scleral icterus.    Conjunctiva/sclera: Conjunctivae normal.  Neck:     Thyroid: No thyromegaly.  Cardiovascular:     Rate and Rhythm: Normal rate and regular rhythm.     Heart sounds: Normal heart sounds. No murmur.  Pulmonary:     Effort: Pulmonary effort is normal. No respiratory distress.     Breath sounds: Normal breath sounds. No wheezing.  Musculoskeletal:        General: Normal range of motion.     Cervical back: Neck supple.     Comments: Right lower costal tenderness, also with inspiration has some, likely costochondritis  Lymphadenopathy:     Cervical: No cervical adenopathy.  Skin:  General: Skin is warm and dry.     Findings: No rash.  Neurological:     Mental Status: He is alert and oriented to person, place, and time.     Coordination: Coordination normal.  Psychiatric:        Behavior: Behavior normal.       Assessment & Plan:   Problem List Items Addressed This Visit      Endocrine   Hypothyroidism following radioiodine therapy - Primary   Relevant Orders   CBC with Differential/Platelet   TSH    Other Visit Diagnoses    Lipid screening       Relevant Orders   CBC with Differential/Platelet   Lipid panel   Diabetes mellitus screening       Relevant Orders   CBC with Differential/Platelet   CMP14+EGFR   Costochondritis          Recommended anti-inflammatories for at least a week or 2 to help calm it  down, call if not improved Follow up plan: Return in about 6 months (around 11/07/2019), or if symptoms worsen or fail to improve, for Thyroid recheck.  Counseling provided for all of the vaccine components Orders Placed This Encounter  Procedures  . Pneumococcal conjugate vaccine 13-valent  . CBC with Differential/Platelet  . CMP14+EGFR  . Lipid panel  . TSH    Caryl Pina, MD [facility] 05/08/2019, 4:21 PM

## 2019-05-09 LAB — CMP14+EGFR
ALT: 14 IU/L (ref 0–44)
AST: 31 IU/L (ref 0–40)
Albumin/Globulin Ratio: 1.4 (ref 1.2–2.2)
Albumin: 4.6 g/dL (ref 3.8–4.8)
Alkaline Phosphatase: 80 IU/L (ref 39–117)
BUN/Creatinine Ratio: 9 — ABNORMAL LOW (ref 10–24)
BUN: 9 mg/dL (ref 8–27)
Bilirubin Total: 0.7 mg/dL (ref 0.0–1.2)
CO2: 25 mmol/L (ref 20–29)
Calcium: 9.2 mg/dL (ref 8.6–10.2)
Chloride: 104 mmol/L (ref 96–106)
Creatinine, Ser: 1.02 mg/dL (ref 0.76–1.27)
GFR calc Af Amer: 89 mL/min/{1.73_m2} (ref >59)
GFR calc non Af Amer: 77 mL/min/{1.73_m2} (ref >59)
Globulin, Total: 3.4 g/dL (ref 1.5–4.5)
Glucose: 82 mg/dL (ref 65–99)
Potassium: 4.1 mmol/L (ref 3.5–5.2)
Sodium: 144 mmol/L (ref 134–144)
Total Protein: 8 g/dL (ref 6.0–8.5)

## 2019-05-09 LAB — CBC WITH DIFFERENTIAL/PLATELET
Basophils Absolute: 0 10*3/uL (ref 0.0–0.2)
Basos: 0 %
EOS (ABSOLUTE): 0.1 10*3/uL (ref 0.0–0.4)
Eos: 1 %
Hematocrit: 38.2 % (ref 37.5–51.0)
Hemoglobin: 12.9 g/dL — ABNORMAL LOW (ref 13.0–17.7)
Immature Grans (Abs): 0 10*3/uL (ref 0.0–0.1)
Immature Granulocytes: 0 %
Lymphocytes Absolute: 1.1 10*3/uL (ref 0.7–3.1)
Lymphs: 26 %
MCH: 30.6 pg (ref 26.6–33.0)
MCHC: 33.8 g/dL (ref 31.5–35.7)
MCV: 91 fL (ref 79–97)
Monocytes Absolute: 0.3 10*3/uL (ref 0.1–0.9)
Monocytes: 7 %
Neutrophils Absolute: 2.8 10*3/uL (ref 1.4–7.0)
Neutrophils: 66 %
Platelets: 254 10*3/uL (ref 150–450)
RBC: 4.22 x10E6/uL (ref 4.14–5.80)
RDW: 13.3 % (ref 11.6–15.4)
WBC: 4.3 10*3/uL (ref 3.4–10.8)

## 2019-05-09 LAB — TSH: TSH: 0.425 u[IU]/mL — ABNORMAL LOW (ref 0.450–4.500)

## 2019-05-09 LAB — LIPID PANEL
Chol/HDL Ratio: 3.9 ratio (ref 0.0–5.0)
Cholesterol, Total: 187 mg/dL (ref 100–199)
HDL: 48 mg/dL (ref >39)
LDL Chol Calc (NIH): 125 mg/dL — ABNORMAL HIGH (ref 0–99)
Triglycerides: 74 mg/dL (ref 0–149)
VLDL Cholesterol Cal: 14 mg/dL (ref 5–40)

## 2019-05-13 ENCOUNTER — Telehealth: Payer: Self-pay | Admitting: Family Medicine

## 2019-05-13 ENCOUNTER — Encounter: Payer: Self-pay | Admitting: *Deleted

## 2019-05-13 NOTE — Telephone Encounter (Signed)
Left message to please call our office. 

## 2019-06-05 ENCOUNTER — Encounter: Payer: Self-pay | Admitting: Family Medicine

## 2019-06-05 ENCOUNTER — Ambulatory Visit (INDEPENDENT_AMBULATORY_CARE_PROVIDER_SITE_OTHER): Payer: Medicare HMO | Admitting: Family Medicine

## 2019-06-05 ENCOUNTER — Ambulatory Visit (INDEPENDENT_AMBULATORY_CARE_PROVIDER_SITE_OTHER): Payer: Medicare HMO

## 2019-06-05 ENCOUNTER — Other Ambulatory Visit: Payer: Self-pay

## 2019-06-05 VITALS — BP 129/76 | HR 57 | Temp 98.5°F | Ht 70.5 in | Wt 185.4 lb

## 2019-06-05 DIAGNOSIS — M940 Chondrocostal junction syndrome [Tietze]: Secondary | ICD-10-CM | POA: Diagnosis not present

## 2019-06-05 DIAGNOSIS — R0789 Other chest pain: Secondary | ICD-10-CM | POA: Diagnosis not present

## 2019-06-05 NOTE — Progress Notes (Signed)
BP 129/76   Pulse (!) 57   Temp 98.5 F (36.9 C)   Ht 5' 10.5" (1.791 m)   Wt 185 lb 6 oz (84.1 kg)   SpO2 97%   BMI 26.22 kg/m    Subjective:   Patient ID: Robert [name], male    DOB: [birth year], 64 y.o.   MRN: [id number]  HPI: Robert [name] is a 64 y.o. male presenting on 06/05/2019 for Muscle Pain (Left and right sided)   HPI Patient is having chest wall muscle pain on his left and right side under his axilla which is higher up than what it was previously.  He has been taking Advil still.  He says the pain hurts more with rotation and movement of his chest and arms.  He denies any chest pain at rest or trouble breathing.  He does say that sometimes he gets the pain with very deep inspiration as well.  Relevant past medical, surgical, family and social history reviewed and updated as indicated. Interim medical history since our last visit reviewed. Allergies and medications reviewed and updated.  Review of Systems  HENT: Negative for congestion.   Respiratory: Negative for cough, chest tightness, shortness of breath and wheezing.   Cardiovascular: Positive for chest pain. Negative for palpitations and leg swelling.    Per HPI unless specifically indicated above   Allergies as of 06/05/2019   No Known Allergies     Medication List       Accurate as of Jun 05, 2019  9:28 AM. If you have any questions, ask your nurse or doctor.        acetaminophen 325 MG tablet Commonly known as: TYLENOL Take 650 mg by mouth every 6 (six) hours as needed.   fluticasone 50 MCG/ACT nasal spray Commonly known as: FLONASE Place 1 spray into both nostrils 2 (two) times daily as needed for allergies or rhinitis.   ibuprofen 200 MG tablet Commonly known as: ADVIL Take 200 mg by mouth every 6 (six) hours as needed.   levothyroxine 100 MCG tablet Commonly known as: SYNTHROID Take 1 tablet (100 mcg total) by mouth daily.        Objective:   BP 129/76   Pulse (!) 57   Temp 98.5  F (36.9 C)   Ht 5' 10.5" (1.791 m)   Wt 185 lb 6 oz (84.1 kg)   SpO2 97%   BMI 26.22 kg/m   Wt Readings from Last 3 Encounters:  06/05/19 185 lb 6 oz (84.1 kg)  05/08/19 186 lb 2 oz (84.4 kg)  10/01/18 184 lb 12.8 oz (83.8 kg)    Physical Exam Vitals and nursing note reviewed.  Constitutional:      General: He is not in acute distress.    Appearance: He is well-developed. He is not diaphoretic.  Eyes:     General: No scleral icterus.    Conjunctiva/sclera: Conjunctivae normal.  Neck:     Thyroid: No thyromegaly.  Cardiovascular:     Rate and Rhythm: Normal rate and regular rhythm.     Heart sounds: Normal heart sounds. No murmur.  Pulmonary:     Effort: Pulmonary effort is normal. No respiratory distress.     Breath sounds: Normal breath sounds. No stridor. No wheezing or rhonchi.  Chest:     Chest wall: Tenderness (Chest wall tenderness, anteriorly on both sides and under both axilla) present.  Neurological:     Mental Status: He is alert and oriented to person, place, and  time.     Coordination: Coordination normal.  Psychiatric:        Behavior: Behavior normal.     Chest x-ray, await full read from radiologist.  Assessment & Plan:   Problem List Items Addressed This Visit    None    Visit Diagnoses    Costochondritis    -  Primary   Relevant Orders   DG Chest 2 View    Will get chest x-ray but likely still costochondritis, just worsening and higher up, will send to physical therapy, continue Advil.  Follow up plan: Return if symptoms worsen or fail to improve.  Counseling provided for all of the vaccine components Orders Placed This Encounter  Procedures  . DG Chest 2 View    Caryl Pina, MD [facility] 06/05/2019, 9:28 AM

## 2019-06-23 DIAGNOSIS — R69 Illness, unspecified: Secondary | ICD-10-CM | POA: Diagnosis not present

## 2019-07-15 DIAGNOSIS — Z0100 Encounter for examination of eyes and vision without abnormal findings: Secondary | ICD-10-CM | POA: Diagnosis not present

## 2019-07-15 DIAGNOSIS — H40023 Open angle with borderline findings, high risk, bilateral: Secondary | ICD-10-CM | POA: Diagnosis not present

## 2019-07-15 DIAGNOSIS — H2513 Age-related nuclear cataract, bilateral: Secondary | ICD-10-CM | POA: Diagnosis not present

## 2019-07-15 DIAGNOSIS — H5200 Hypermetropia, unspecified eye: Secondary | ICD-10-CM | POA: Diagnosis not present

## 2019-08-03 DIAGNOSIS — H2513 Age-related nuclear cataract, bilateral: Secondary | ICD-10-CM | POA: Diagnosis not present

## 2019-08-03 DIAGNOSIS — H40013 Open angle with borderline findings, low risk, bilateral: Secondary | ICD-10-CM | POA: Diagnosis not present

## 2019-09-22 ENCOUNTER — Other Ambulatory Visit: Payer: Self-pay | Admitting: Family Medicine

## 2019-10-05 ENCOUNTER — Telehealth: Payer: Self-pay | Admitting: Family Medicine

## 2019-10-05 DIAGNOSIS — E890 Postprocedural hypothyroidism: Secondary | ICD-10-CM

## 2019-10-05 NOTE — Telephone Encounter (Signed)
Pt called stating that he feels like his thyroid levels are up and he would like Dr Dettinger to put in a lab order for him to have his levels checked asap.

## 2019-10-06 ENCOUNTER — Other Ambulatory Visit: Payer: Self-pay

## 2019-10-06 ENCOUNTER — Other Ambulatory Visit: Payer: Medicare HMO

## 2019-10-06 DIAGNOSIS — E890 Postprocedural hypothyroidism: Secondary | ICD-10-CM | POA: Diagnosis not present

## 2019-10-06 NOTE — Telephone Encounter (Signed)
Left message.  Orders place for labs and appt made for follow up appt with Dr. Louanne Skye

## 2019-10-06 NOTE — Telephone Encounter (Signed)
I put in order for patient to do thyroid but make sure he has a follow-up appointment soon, he supposed to see me sometime this month or next month as well on his routine schedule.

## 2019-10-07 LAB — TSH: TSH: 2.05 u[IU]/mL (ref 0.450–4.500)

## 2019-10-21 ENCOUNTER — Ambulatory Visit (INDEPENDENT_AMBULATORY_CARE_PROVIDER_SITE_OTHER): Payer: Medicare HMO | Admitting: Family Medicine

## 2019-10-21 ENCOUNTER — Ambulatory Visit: Payer: Medicare HMO | Admitting: Family Medicine

## 2019-10-21 ENCOUNTER — Other Ambulatory Visit: Payer: Self-pay

## 2019-10-21 ENCOUNTER — Encounter: Payer: Self-pay | Admitting: Family Medicine

## 2019-10-21 VITALS — BP 136/84 | HR 56 | Temp 98.0°F | Ht 70.5 in | Wt 187.0 lb

## 2019-10-21 DIAGNOSIS — Z23 Encounter for immunization: Secondary | ICD-10-CM

## 2019-10-21 DIAGNOSIS — E890 Postprocedural hypothyroidism: Secondary | ICD-10-CM

## 2019-10-21 DIAGNOSIS — E663 Overweight: Secondary | ICD-10-CM | POA: Diagnosis not present

## 2019-10-21 NOTE — Progress Notes (Signed)
BP 136/84   Pulse (!) 56   Temp 98 F (36.7 C)   Ht 5' 10.5" (1.791 m)   Wt 84.8 kg   SpO2 96%   BMI 26.45 kg/m    Subjective:   Patient ID: Robert [name], male    DOB: Jan 09, 1956, 64 y.o.   MRN: [id number]  HPI: Robert [name] is a 64 y.o. male presenting on 10/21/2019 for Medical Management of Chronic Issues and Hypothyroidism   HPI Hypothyroidism Patient denies concerns today. Last TSH level checked on 9/14 was wnl (2.05). He states he is compliant with his synthroid 164mg and denies medication side effects. Patient denies hot/cold intolerance, chest pain, palpitations, GI symptoms, skin or hair changes, dizziness or weakness.   Ingrown hair Patient states he has had a bump which recently appeared in the pubic area that he describes as a "blackhead". He states he has had this in the past but it disappeared on its own. Denies pain, discharge, erythema, systemic symptoms.  Relevant past medical, surgical, family and social history reviewed and updated as indicated. Interim medical history since our last visit reviewed. Allergies and medications reviewed and updated.  Review of Systems  Constitutional: Negative for fatigue and fever.  Respiratory: Negative for shortness of breath.   Cardiovascular: Negative for chest pain, palpitations and leg swelling.  Gastrointestinal: Negative for constipation, diarrhea, nausea and vomiting.  Endocrine: Negative for cold intolerance, heat intolerance, polydipsia and polyphagia.  Genitourinary: Positive for genital sores.  Musculoskeletal: Negative for arthralgias and myalgias.  Skin: Negative for rash.  Neurological: Negative for dizziness and weakness.  Psychiatric/Behavioral: Negative for dysphoric mood. The patient is not nervous/anxious.     Per HPI unless specifically indicated above   Allergies as of 10/21/2019   No Known Allergies     Medication List       Accurate as of October 21, 2019  9:03 AM. If you have any  questions, ask your nurse or doctor.        acetaminophen 325 MG tablet Commonly known as: TYLENOL Take 650 mg by mouth every 6 (six) hours as needed.   fluticasone 50 MCG/ACT nasal spray Commonly known as: FLONASE Place 1 spray into both nostrils 2 (two) times daily as needed for allergies or rhinitis.   ibuprofen 200 MG tablet Commonly known as: ADVIL Take 200 mg by mouth every 6 (six) hours as needed.   levothyroxine 100 MCG tablet Commonly known as: SYNTHROID TAKE 1 TABLET BY MOUTH EVERY DAY        Objective:   BP 136/84   Pulse (!) 56   Temp 98 F (36.7 C)   Ht 5' 10.5" (1.791 m)   Wt 84.8 kg   SpO2 96%   BMI 26.45 kg/m   Wt Readings from Last 3 Encounters:  10/21/19 84.8 kg  06/05/19 84.1 kg  05/08/19 84.4 kg    Physical Exam Constitutional:      General: He is not in acute distress.    Appearance: Normal appearance.  HENT:     Head: Normocephalic and atraumatic.     Nose: Nose normal.     Mouth/Throat:     Mouth: Mucous membranes are moist.  Eyes:     Conjunctiva/sclera: Conjunctivae normal.     Pupils: Pupils are equal, round, and reactive to light.  Neck:     Comments: No thyromegaly or nodules. Cardiovascular:     Rate and Rhythm: Normal rate and regular rhythm.     Pulses: Normal pulses.  Heart sounds: Normal heart sounds.  Pulmonary:     Effort: Pulmonary effort is normal.     Breath sounds: Normal breath sounds.  Abdominal:     General: Abdomen is flat.     Palpations: Abdomen is soft.     Tenderness: There is no abdominal tenderness.  Musculoskeletal:     Cervical back: Normal range of motion and neck supple.     Right lower leg: No edema.     Left lower leg: No edema.  Skin:    Findings: No rash.     Comments: Small ingrown hair in pubic region  Neurological:     Mental Status: He is alert.  Psychiatric:        Mood and Affect: Mood normal.        Behavior: Behavior normal.       Assessment & Plan:   Problem List  Items Addressed This Visit      Endocrine   Hypothyroidism following radioiodine therapy - Primary   Relevant Orders   CMP14+EGFR   TSH     Other   Overweight (BMI 25.0-29.9)   Relevant Orders   Lipid panel    Other Visit Diagnoses    Flu vaccine need       Relevant Orders   Flu Vaccine QUAD High Dose(Fluad) (Completed)       Follow up plan: Return in about 6 months (around 04/19/2020), or if symptoms worsen or fail to improve, for Thyroid disease.  Patient's hypothyroidism is well controlled at this time. Plan to obtain TSH, CMP, and lipid panel today. Continue synthroid 169mg. Follow up in 6 months for recheck.  Advised patient that bump in pubic region is most likely an ingrown hair. Recommended OTC antibiotic ointment. Return if it fails to improve or symptoms worsen.   Counseling provided for all of the vaccine components No orders of the defined types were placed in this encounter.  Patient seen and examined with SLuella Cook PA student, agree with assessment and plan above.  Sounds like patient is doing well on his thyroid and continue with that, will use a topical antibiotic cream for the ingrown hair, appears to already be improving.  Call if worsens or does not change. JCaryl Pina MD [facility] 10/21/2019, 9:03 AM

## 2019-10-22 LAB — CMP14+EGFR
ALT: 13 IU/L (ref 0–44)
AST: 27 IU/L (ref 0–40)
Albumin/Globulin Ratio: 1.3 (ref 1.2–2.2)
Albumin: 4.7 g/dL (ref 3.8–4.8)
Alkaline Phosphatase: 72 IU/L (ref 44–121)
BUN/Creatinine Ratio: 15 (ref 10–24)
BUN: 15 mg/dL (ref 8–27)
Bilirubin Total: 0.6 mg/dL (ref 0.0–1.2)
CO2: 24 mmol/L (ref 20–29)
Calcium: 9.3 mg/dL (ref 8.6–10.2)
Chloride: 100 mmol/L (ref 96–106)
Creatinine, Ser: 1.03 mg/dL (ref 0.76–1.27)
GFR calc Af Amer: 88 mL/min/{1.73_m2} (ref >59)
GFR calc non Af Amer: 76 mL/min/{1.73_m2} (ref >59)
Globulin, Total: 3.6 g/dL (ref 1.5–4.5)
Glucose: 95 mg/dL (ref 65–99)
Potassium: 4 mmol/L (ref 3.5–5.2)
Sodium: 138 mmol/L (ref 134–144)
Total Protein: 8.3 g/dL (ref 6.0–8.5)

## 2019-10-22 LAB — LIPID PANEL
Chol/HDL Ratio: 4 ratio (ref 0.0–5.0)
Cholesterol, Total: 219 mg/dL — ABNORMAL HIGH (ref 100–199)
HDL: 55 mg/dL (ref >39)
LDL Chol Calc (NIH): 148 mg/dL — ABNORMAL HIGH (ref 0–99)
Triglycerides: 92 mg/dL (ref 0–149)
VLDL Cholesterol Cal: 16 mg/dL (ref 5–40)

## 2019-10-22 LAB — TSH: TSH: 2.6 u[IU]/mL (ref 0.450–4.500)

## 2019-10-26 ENCOUNTER — Telehealth: Payer: Self-pay

## 2019-10-26 MED ORDER — ATORVASTATIN CALCIUM 20 MG PO TABS: 20.0000 mg | ORAL_TABLET | Freq: Every evening | ORAL | 1 refills | Status: DC

## 2019-10-26 NOTE — Telephone Encounter (Signed)
Patient aware of results and medication refill sent to pharmacy.  Patient states that he is working on diet as well.

## 2019-10-30 ENCOUNTER — Telehealth: Payer: Self-pay | Admitting: *Deleted

## 2019-10-30 MED ORDER — AZITHROMYCIN 250 MG PO TABS: ORAL_TABLET | ORAL | 0 refills | Status: DC

## 2019-10-30 NOTE — Telephone Encounter (Signed)
Pt aware.

## 2019-10-30 NOTE — Telephone Encounter (Signed)
Sent Z-Pak for the patient

## 2019-10-30 NOTE — Telephone Encounter (Signed)
Just saw you on 10/21/19. Had flu shot on that day. He has had sinus drainage and is hoarse, please give a zpack, he has to speak at [facility]

## 2019-12-10 ENCOUNTER — Emergency Department (HOSPITAL_COMMUNITY): Payer: Medicare HMO

## 2019-12-10 ENCOUNTER — Inpatient Hospital Stay (HOSPITAL_COMMUNITY)
Admission: EM | Admit: 2019-12-10 | Discharge: 2019-12-13 | DRG: 516 | Disposition: A | Discharge status: Home Health | Payer: Medicare HMO | Attending: Student | Admitting: Student

## 2019-12-10 ENCOUNTER — Other Ambulatory Visit: Payer: Self-pay

## 2019-12-10 DIAGNOSIS — W19XXXA Unspecified fall, initial encounter: Secondary | ICD-10-CM

## 2019-12-10 DIAGNOSIS — S022XXA Fracture of nasal bones, initial encounter for closed fracture: Secondary | ICD-10-CM

## 2019-12-10 DIAGNOSIS — S82002A Unspecified fracture of left patella, initial encounter for closed fracture: Secondary | ICD-10-CM | POA: Diagnosis present

## 2019-12-10 DIAGNOSIS — S82045A Nondisplaced comminuted fracture of left patella, initial encounter for closed fracture: Principal | ICD-10-CM

## 2019-12-10 DIAGNOSIS — Z419 Encounter for procedure for purposes other than remedying health state, unspecified: Secondary | ICD-10-CM

## 2019-12-10 DIAGNOSIS — T148XXA Other injury of unspecified body region, initial encounter: Secondary | ICD-10-CM

## 2019-12-10 DIAGNOSIS — R9431 Abnormal electrocardiogram [ECG] [EKG]: Secondary | ICD-10-CM | POA: Diagnosis not present

## 2019-12-10 DIAGNOSIS — S82042A Displaced comminuted fracture of left patella, initial encounter for closed fracture: Secondary | ICD-10-CM | POA: Diagnosis not present

## 2019-12-10 DIAGNOSIS — S80912A Unspecified superficial injury of left knee, initial encounter: Secondary | ICD-10-CM | POA: Diagnosis not present

## 2019-12-10 DIAGNOSIS — W1789XA Other fall from one level to another, initial encounter: Secondary | ICD-10-CM | POA: Diagnosis not present

## 2019-12-10 DIAGNOSIS — W11XXXA Fall on and from ladder, initial encounter: Secondary | ICD-10-CM | POA: Diagnosis not present

## 2019-12-10 DIAGNOSIS — I10 Essential (primary) hypertension: Secondary | ICD-10-CM | POA: Diagnosis not present

## 2019-12-10 DIAGNOSIS — R079 Chest pain, unspecified: Secondary | ICD-10-CM | POA: Diagnosis not present

## 2019-12-10 DIAGNOSIS — E039 Hypothyroidism, unspecified: Secondary | ICD-10-CM | POA: Diagnosis present

## 2019-12-10 DIAGNOSIS — E785 Hyperlipidemia, unspecified: Secondary | ICD-10-CM | POA: Diagnosis present

## 2019-12-10 DIAGNOSIS — Z7982 Long term (current) use of aspirin: Secondary | ICD-10-CM

## 2019-12-10 DIAGNOSIS — D62 Acute posthemorrhagic anemia: Secondary | ICD-10-CM | POA: Diagnosis not present

## 2019-12-10 DIAGNOSIS — S82015A Nondisplaced osteochondral fracture of left patella, initial encounter for closed fracture: Secondary | ICD-10-CM | POA: Diagnosis not present

## 2019-12-10 DIAGNOSIS — M4802 Spinal stenosis, cervical region: Secondary | ICD-10-CM | POA: Diagnosis not present

## 2019-12-10 DIAGNOSIS — Z23 Encounter for immunization: Secondary | ICD-10-CM

## 2019-12-10 DIAGNOSIS — Z20822 Contact with and (suspected) exposure to covid-19: Secondary | ICD-10-CM | POA: Diagnosis present

## 2019-12-10 DIAGNOSIS — Z79899 Other long term (current) drug therapy: Secondary | ICD-10-CM

## 2019-12-10 DIAGNOSIS — R102 Pelvic and perineal pain: Secondary | ICD-10-CM | POA: Diagnosis not present

## 2019-12-10 DIAGNOSIS — Z043 Encounter for examination and observation following other accident: Secondary | ICD-10-CM | POA: Diagnosis not present

## 2019-12-10 DIAGNOSIS — R609 Edema, unspecified: Secondary | ICD-10-CM | POA: Diagnosis not present

## 2019-12-10 DIAGNOSIS — S0003XA Contusion of scalp, initial encounter: Secondary | ICD-10-CM | POA: Diagnosis not present

## 2019-12-10 DIAGNOSIS — Z79890 Hormone replacement therapy (postmenopausal): Secondary | ICD-10-CM

## 2019-12-10 LAB — COMPREHENSIVE METABOLIC PANEL
ALT: 31 U/L (ref 0–44)
AST: 49 U/L — ABNORMAL HIGH (ref 15–41)
Albumin: 4.1 g/dL (ref 3.5–5.0)
Alkaline Phosphatase: 57 U/L (ref 38–126)
Anion gap: 10 (ref 5–15)
BUN: 12 mg/dL (ref 8–23)
CO2: 25 mmol/L (ref 22–32)
Calcium: 9.1 mg/dL (ref 8.9–10.3)
Chloride: 106 mmol/L (ref 98–111)
Creatinine, Ser: 1.1 mg/dL (ref 0.61–1.24)
GFR, Estimated: >60 mL/min (ref >60)
Glucose, Bld: 99 mg/dL (ref 70–99)
Potassium: 4 mmol/L (ref 3.5–5.1)
Sodium: 141 mmol/L (ref 135–145)
Total Bilirubin: 0.8 mg/dL (ref 0.3–1.2)
Total Protein: 7.9 g/dL (ref 6.5–8.1)

## 2019-12-10 LAB — ETHANOL: Alcohol, Ethyl (B): <10 mg/dL (ref <10)

## 2019-12-10 LAB — URINALYSIS, ROUTINE W REFLEX MICROSCOPIC
Bilirubin Urine: NEGATIVE
Glucose, UA: NEGATIVE mg/dL
Hgb urine dipstick: NEGATIVE
Ketones, ur: 5 mg/dL — AB
Leukocytes,Ua: NEGATIVE
Nitrite: NEGATIVE
Protein, ur: NEGATIVE mg/dL
Specific Gravity, Urine: >1.046 — ABNORMAL HIGH (ref 1.005–1.030)
pH: 6 (ref 5.0–8.0)

## 2019-12-10 LAB — CBC
HCT: 37 % — ABNORMAL LOW (ref 39.0–52.0)
Hemoglobin: 12.7 g/dL — ABNORMAL LOW (ref 13.0–17.0)
MCH: 31.1 pg (ref 26.0–34.0)
MCHC: 34.3 g/dL (ref 30.0–36.0)
MCV: 90.7 fL (ref 80.0–100.0)
Platelets: 198 10*3/uL (ref 150–400)
RBC: 4.08 MIL/uL — ABNORMAL LOW (ref 4.22–5.81)
RDW: 12.9 % (ref 11.5–15.5)
WBC: 5.9 10*3/uL (ref 4.0–10.5)
nRBC: 0 % (ref 0.0–0.2)

## 2019-12-10 LAB — LACTIC ACID, PLASMA: Lactic Acid, Venous: 1.1 mmol/L (ref 0.5–1.9)

## 2019-12-10 LAB — I-STAT CHEM 8, ED
BUN: 13 mg/dL (ref 8–23)
Calcium, Ion: 1.11 mmol/L — ABNORMAL LOW (ref 1.15–1.40)
Chloride: 105 mmol/L (ref 98–111)
Creatinine, Ser: 1 mg/dL (ref 0.61–1.24)
Glucose, Bld: 91 mg/dL (ref 70–99)
HCT: 38 % — ABNORMAL LOW (ref 39.0–52.0)
Hemoglobin: 12.9 g/dL — ABNORMAL LOW (ref 13.0–17.0)
Potassium: 3.9 mmol/L (ref 3.5–5.1)
Sodium: 141 mmol/L (ref 135–145)
TCO2: 25 mmol/L (ref 22–32)

## 2019-12-10 LAB — PROTIME-INR
INR: 1.1 (ref 0.8–1.2)
Prothrombin Time: 13.8 seconds (ref 11.4–15.2)

## 2019-12-10 LAB — SAMPLE TO BLOOD BANK

## 2019-12-10 LAB — RESPIRATORY PANEL BY RT PCR (FLU A&B, COVID)
Influenza A by PCR: NEGATIVE
Influenza B by PCR: NEGATIVE
SARS Coronavirus 2 by RT PCR: NEGATIVE

## 2019-12-10 LAB — CDS SEROLOGY

## 2019-12-10 MED ORDER — METHOCARBAMOL 500 MG PO TABS
500.0000 mg | ORAL_TABLET | Freq: Four times a day (QID) | ORAL | Status: DC | PRN
  Administered 2019-12-12 – 2019-12-13 (×4): 500 mg via ORAL
  Filled 2019-12-10 (×4): qty 1

## 2019-12-10 MED ORDER — ENOXAPARIN SODIUM 40 MG/0.4ML ~~LOC~~ SOLN
40.0000 mg | SUBCUTANEOUS | Status: DC
  Administered 2019-12-10: 40 mg via SUBCUTANEOUS
  Filled 2019-12-10: qty 0.4

## 2019-12-10 MED ORDER — MORPHINE SULFATE (PF) 2 MG/ML IV SOLN
2.0000 mg | INTRAVENOUS | Status: DC | PRN
  Administered 2019-12-11 – 2019-12-12 (×2): 2 mg via INTRAVENOUS
  Filled 2019-12-10 (×2): qty 1

## 2019-12-10 MED ORDER — POVIDONE-IODINE 10 % EX SWAB: 2.0000 "application " | Freq: Once | CUTANEOUS | Status: DC

## 2019-12-10 MED ORDER — MORPHINE SULFATE (PF) 4 MG/ML IV SOLN
4.0000 mg | Freq: Once | INTRAVENOUS | Status: AC
  Administered 2019-12-10: 4 mg via INTRAVENOUS
  Filled 2019-12-10: qty 1

## 2019-12-10 MED ORDER — ONDANSETRON HCL 4 MG PO TABS: 4.0000 mg | ORAL_TABLET | Freq: Four times a day (QID) | ORAL | Status: DC | PRN

## 2019-12-10 MED ORDER — CEFAZOLIN SODIUM-DEXTROSE 2-4 GM/100ML-% IV SOLN
2.0000 g | INTRAVENOUS | Status: AC
  Administered 2019-12-11: 2 g via INTRAVENOUS
  Filled 2019-12-10: qty 100

## 2019-12-10 MED ORDER — ONDANSETRON HCL 4 MG/2ML IJ SOLN
4.0000 mg | Freq: Four times a day (QID) | INTRAMUSCULAR | Status: DC | PRN
  Administered 2019-12-11: 4 mg via INTRAVENOUS

## 2019-12-10 MED ORDER — OXYCODONE HCL 5 MG PO TABS
5.0000 mg | ORAL_TABLET | Freq: Once | ORAL | Status: AC
  Administered 2019-12-10: 5 mg via ORAL
  Filled 2019-12-10: qty 1

## 2019-12-10 MED ORDER — TETANUS-DIPHTH-ACELL PERTUSSIS 5-2.5-18.5 LF-MCG/0.5 IM SUSY
0.5000 mL | PREFILLED_SYRINGE | Freq: Once | INTRAMUSCULAR | Status: AC
  Administered 2019-12-10: 0.5 mL via INTRAMUSCULAR
  Filled 2019-12-10: qty 0.5

## 2019-12-10 MED ORDER — CHLORHEXIDINE GLUCONATE 4 % EX LIQD: 60.0000 mL | Freq: Once | CUTANEOUS | Status: DC

## 2019-12-10 MED ORDER — OXYCODONE HCL 5 MG PO TABS
5.0000 mg | ORAL_TABLET | ORAL | Status: DC | PRN
  Administered 2019-12-10: 15 mg via ORAL
  Administered 2019-12-11 – 2019-12-13 (×9): 10 mg via ORAL
  Filled 2019-12-10 (×8): qty 2
  Filled 2019-12-10: qty 3
  Filled 2019-12-10: qty 2

## 2019-12-10 MED ORDER — METHOCARBAMOL 1000 MG/10ML IJ SOLN
500.0000 mg | Freq: Four times a day (QID) | INTRAVENOUS | Status: DC | PRN
  Filled 2019-12-10 (×3): qty 5

## 2019-12-10 MED ORDER — IOHEXOL 350 MG/ML SOLN
100.0000 mL | Freq: Once | INTRAVENOUS | Status: AC | PRN
  Administered 2019-12-10: 100 mL via INTRAVENOUS

## 2019-12-10 MED ORDER — ENSURE PRE-SURGERY PO LIQD
296.0000 mL | Freq: Once | ORAL | Status: AC
  Administered 2019-12-10: 296 mL via ORAL
  Filled 2019-12-10: qty 296

## 2019-12-10 NOTE — ED Notes (Addendum)
TRN note: Ortho called for knee immobilizer application to Left leg  [facility] TRN [phone]

## 2019-12-10 NOTE — Plan of Care (Signed)
  Problem: Nutrition: Goal: Adequate nutrition will be maintained Outcome: Progressing   Problem: Coping: Goal: Level of anxiety will decrease Outcome: Progressing   

## 2019-12-10 NOTE — Progress Notes (Signed)
Orthopedic Tech Progress Note Patient Details:  Robert [name] 01/22/1877 [id number] Level 2 Trauma not currently needed Patient ID: Robert [name], male   DOB: 01/22/1877, 64 y.o.   MRN: [id number]   [name] 12/10/2019, 2:05 PM

## 2019-12-10 NOTE — ED Provider Notes (Signed)
[facility] EMERGENCY DEPARTMENT Provider Note   CSN: [id number] Arrival date & time: 12/10/19  1401     History CC:  Fall off ladder  Robert [name] is a 64 y.o. male reports no sniffing a past medical history present emerge department a mechanical fall off a ladder today.  The patient was cleaning a seatbelt and fell off a ladder approximately 7 feet height.  He says his bilateral hands and his face struck the pavement, as well as his left knee.  Is having significant pain in his left knee as well as his left nose.  EMS reports he had an obvious deformity to left knee.  He did have pulses intact.  Did not receive any medications per EMS.  He was placed in a C-spine collar in route to the hospital.  On my exam the patient reports he does not have significant pain.  Is not of a headache.  He says his nose hurts and his knee hurts but otherwise he does not have any pain anywhere else in his body, including his wrist or elbows.  He denies any chest pain or back pain.  He reports he takes a baby aspirin daily but no other medications.  He denies any other medical problems.  He has no known drug allergies.  He has not received the vaccine for Covid.  HPI     No past medical history on file.  Patient Active Problem List   Diagnosis Date Noted   Left patella fracture 12/10/2019    No family history on file.  Social History   Tobacco Use   Smoking status: Not on file  Substance Use Topics   Alcohol use: Not on file   Drug use: Not on file    Home Medications Prior to Admission medications   Medication Sig Start Date End Date Taking? Authorizing Provider  aspirin EC 81 MG tablet Take 81 mg by mouth daily.    Yes [provider]  atorvastatin (LIPITOR) 20 MG tablet Take 20 mg by mouth at bedtime.  10/26/19  Yes [provider]  levothyroxine (SYNTHROID) 100 MCG tablet Take 100 mcg by mouth daily. 09/22/19  Yes [provider]  Multiple  Vitamins-Minerals (ONE-A-DAY MENS 50+ ADVANTAGE PO) Take 1 tablet by mouth daily.   Yes [provider]    Allergies    Patient has no known allergies.  Review of Systems   Review of Systems  Constitutional: Negative for chills and fever.  HENT: Positive for facial swelling. Negative for trouble swallowing.   Eyes: Negative for pain and visual disturbance.  Respiratory: Negative for cough and shortness of breath.   Cardiovascular: Negative for chest pain and palpitations.  Gastrointestinal: Negative for abdominal pain and vomiting.  Musculoskeletal: Positive for arthralgias and myalgias.  Skin: Negative for color change and rash.  Neurological: Negative for syncope, numbness and headaches.  Psychiatric/Behavioral: Negative for agitation and confusion.  All other systems reviewed and are negative.   Physical Exam Updated Vital Signs BP (!) 143/81    Pulse 72    Temp 97.6 F (36.4 C) (Oral)    Resp 16    Ht  (1.778 m)    Wt 84.4 kg    SpO2 98%    BMI 26.69 kg/m   Physical Exam Vitals and nursing note reviewed.  Constitutional:      Appearance: He is well-developed.  HENT:     Head: Normocephalic. No raccoon eyes or Battle's sign.  Jaw: There is normal jaw occlusion. No trismus, swelling or malocclusion.     Comments: Significant swelling and tenderness of the nose No septal hematoma     Right Ear: Tympanic membrane and ear canal normal.     Left Ear: Tympanic membrane and ear canal normal.  Eyes:     Conjunctiva/sclera: Conjunctivae normal.  Neck:     Comments: C spine collar in place Cardiovascular:     Rate and Rhythm: Normal rate and regular rhythm.     Pulses: Normal pulses.  Pulmonary:     Effort: Pulmonary effort is normal. No respiratory distress.     Breath sounds: Normal breath sounds.  Abdominal:     General: There is no distension.     Palpations: Abdomen is soft.     Tenderness: There is no abdominal tenderness. There is no guarding.   Musculoskeletal:     Comments: Deformity and tenderness of the left patella, no open fracture Remainder of MSK exam benign including bilateral wrists, elbows, shoulders, hips, and right extremity  Skin:    General: Skin is warm and dry.  Neurological:     General: No focal deficit present.     Mental Status: He is alert and oriented to person, place, and time.     Sensory: No sensory deficit.     Comments: No spinal midline tenderness     ED Results / Procedures / Treatments   Labs (all labs ordered are listed, but only abnormal results are displayed) Labs Reviewed  COMPREHENSIVE METABOLIC PANEL - Abnormal; Notable for the following components:      Result Value   AST 49 (*)    All other components within normal limits  CBC - Abnormal; Notable for the following components:   RBC 4.08 (*)    Hemoglobin 12.7 (*)    HCT 37.0 (*)    All other components within normal limits  URINALYSIS, ROUTINE W REFLEX MICROSCOPIC - Abnormal; Notable for the following components:   Specific Gravity, Urine >1.046 (*)    Ketones, ur 5 (*)    All other components within normal limits  I-STAT CHEM 8, ED - Abnormal; Notable for the following components:   Calcium, Ion 1.11 (*)    Hemoglobin 12.9 (*)    HCT 38.0 (*)    All other components within normal limits  RESPIRATORY PANEL BY RT PCR (FLU A&B, COVID)  ETHANOL  LACTIC ACID, PLASMA  PROTIME-INR  CDS SEROLOGY  SAMPLE TO BLOOD BANK    EKG EKG Interpretation  Date/Time:  Thursday December 10 2019 14:06:36 EST Ventricular Rate:  59 PR Interval:    QRS Duration: 94 QT Interval:  406 QTC Calculation: 403 R Axis:   36 Text Interpretation: Ectopic atrial rhythm Anteroseptal infarct, old No previous ECGs available Confirmed by Kennis Carina [phone]) on 12/10/2019 3:15:22 PM   Radiology CT HEAD WO CONTRAST  Result Date: 12/10/2019 CLINICAL DATA:  Fall from ladder EXAM: CT HEAD WITHOUT CONTRAST CT MAXILLOFACIAL WITHOUT CONTRAST CT CERVICAL  SPINE WITHOUT CONTRAST TECHNIQUE: Multidetector CT imaging of the head, cervical spine, and maxillofacial structures were performed using the standard protocol without intravenous contrast. Multiplanar CT image reconstructions of the cervical spine and maxillofacial structures were also generated. COMPARISON:  None. FINDINGS: CT HEAD FINDINGS Brain: Ventricles and sulci are normal in size and configuration. There is no intracranial mass, hemorrhage, extra-axial fluid collection, or midline shift. Brain parenchyma appears unremarkable. There is no demonstrable acute infarct. Vascular: No hyperdense vessels.  No evident  vascular calcification. Skull: Bony calvarium appears intact. There is a small medial right frontal scalp hematoma. Other: Mastoid air cells are clear. CT MAXILLOFACIAL FINDINGS Osseous: There is a comminuted fracture along the distal nasion as well fractures of the anterior right and left nasal bones. There is a minimally displaced fracture of the anterior vomer. No other fractures are evident. No dislocation. No blastic or lytic bone lesions. Temporomandibular joints appear symmetric. Orbits: Orbits appear symmetric bilaterally. No intraorbital lesion evident. Sinuses: There is suspected hemorrhage in the anterior ethmoid air cell regions. There is a retention cyst in the right sphenoid sinus measuring 2.5 x 1.8 cm. Other paranasal sinuses are clear. No bony destruction or expansion. No air-fluid levels. Ostiomeatal complexes are patent bilaterally. There is edema in each inferior nasal turbinate with inferior nares obstruction bilaterally. Small concha bullosa on each side, an anatomic variant. Leftward deviation of anterior nasal septum noted. Soft tissues: Soft tissue swelling present over nasal and mid face regions. No well-defined facial hematoma. No abscess. Salivary glands appear symmetric bilaterally. No adenopathy evident. Tongue and tongue base regions appear normal. Visualized pharynx  appears normal. CT CERVICAL SPINE FINDINGS Alignment: There is no appreciable spondylolisthesis. Skull base and vertebrae: Skull base and craniocervical junction regions appear normal. No evident fracture. No blastic or lytic bone lesions. Soft tissues and spinal canal: Nuchal ligament calcification is present posterior to C4 and C5. prevertebral soft tissues and predental space regions are normal. There is no evident cord or canal hematoma. There is no paraspinous lesion. Disc levels: There is moderate disc space narrowing at C3-4, C4-5, C5-6, and C6-7. There are anterior osteophytes at C3, C4, C5, and C6. There is facet hypertrophy at multiple levels bilaterally. There is mild exit foraminal narrowing due to bony hypertrophy at C3-4 on the left, at C4-5 on the right, at C5-6 bilaterally, and at C6-7 bilaterally. No disc extrusion or stenosis is appreciable. Uncovertebral spurring is noted at C3, C4, C5, C6, and C7 bilaterally. Upper chest: Visualized upper lung regions are clear. Other: None IMPRESSION: CT head: Small medial right frontal scalp hematoma without underlying fracture. Brain parenchyma appears normal. No mass or hemorrhage. CT maxillofacial: 1. Comminuted anterior nasal fractures. Subtle fracture of the anterior vomer. No other fractures. No dislocations. 2. Probable hemorrhage in the anterior ethmoid regions. Prominent retention cyst in right sphenoid sinus. Edema of the inferior nasal turbinates with obstruction of the inferior nasal cavity medially on each side. Ostiomeatal unit complexes are patent bilaterally. 3.  No intraorbital lesions. 4. Mid face and paranasal soft tissue swelling. No well-defined hematoma. CT cervical spine: No fracture or spondylolisthesis. Osteoarthritic change at multiple levels. No disc extrusion or stenosis. Electronically Signed   By: Bretta Bang III M.D.   On: 12/10/2019 15:32   CT CERVICAL SPINE WO CONTRAST  Result Date: 12/10/2019 CLINICAL DATA:  Fall  from ladder EXAM: CT HEAD WITHOUT CONTRAST CT MAXILLOFACIAL WITHOUT CONTRAST CT CERVICAL SPINE WITHOUT CONTRAST TECHNIQUE: Multidetector CT imaging of the head, cervical spine, and maxillofacial structures were performed using the standard protocol without intravenous contrast. Multiplanar CT image reconstructions of the cervical spine and maxillofacial structures were also generated. COMPARISON:  None. FINDINGS: CT HEAD FINDINGS Brain: Ventricles and sulci are normal in size and configuration. There is no intracranial mass, hemorrhage, extra-axial fluid collection, or midline shift. Brain parenchyma appears unremarkable. There is no demonstrable acute infarct. Vascular: No hyperdense vessels.  No evident vascular calcification. Skull: Bony calvarium appears intact. There is a small medial right  frontal scalp hematoma. Other: Mastoid air cells are clear. CT MAXILLOFACIAL FINDINGS Osseous: There is a comminuted fracture along the distal nasion as well fractures of the anterior right and left nasal bones. There is a minimally displaced fracture of the anterior vomer. No other fractures are evident. No dislocation. No blastic or lytic bone lesions. Temporomandibular joints appear symmetric. Orbits: Orbits appear symmetric bilaterally. No intraorbital lesion evident. Sinuses: There is suspected hemorrhage in the anterior ethmoid air cell regions. There is a retention cyst in the right sphenoid sinus measuring 2.5 x 1.8 cm. Other paranasal sinuses are clear. No bony destruction or expansion. No air-fluid levels. Ostiomeatal complexes are patent bilaterally. There is edema in each inferior nasal turbinate with inferior nares obstruction bilaterally. Small concha bullosa on each side, an anatomic variant. Leftward deviation of anterior nasal septum noted. Soft tissues: Soft tissue swelling present over nasal and mid face regions. No well-defined facial hematoma. No abscess. Salivary glands appear symmetric bilaterally. No  adenopathy evident. Tongue and tongue base regions appear normal. Visualized pharynx appears normal. CT CERVICAL SPINE FINDINGS Alignment: There is no appreciable spondylolisthesis. Skull base and vertebrae: Skull base and craniocervical junction regions appear normal. No evident fracture. No blastic or lytic bone lesions. Soft tissues and spinal canal: Nuchal ligament calcification is present posterior to C4 and C5. prevertebral soft tissues and predental space regions are normal. There is no evident cord or canal hematoma. There is no paraspinous lesion. Disc levels: There is moderate disc space narrowing at C3-4, C4-5, C5-6, and C6-7. There are anterior osteophytes at C3, C4, C5, and C6. There is facet hypertrophy at multiple levels bilaterally. There is mild exit foraminal narrowing due to bony hypertrophy at C3-4 on the left, at C4-5 on the right, at C5-6 bilaterally, and at C6-7 bilaterally. No disc extrusion or stenosis is appreciable. Uncovertebral spurring is noted at C3, C4, C5, C6, and C7 bilaterally. Upper chest: Visualized upper lung regions are clear. Other: None IMPRESSION: CT head: Small medial right frontal scalp hematoma without underlying fracture. Brain parenchyma appears normal. No mass or hemorrhage. CT maxillofacial: 1. Comminuted anterior nasal fractures. Subtle fracture of the anterior vomer. No other fractures. No dislocations. 2. Probable hemorrhage in the anterior ethmoid regions. Prominent retention cyst in right sphenoid sinus. Edema of the inferior nasal turbinates with obstruction of the inferior nasal cavity medially on each side. Ostiomeatal unit complexes are patent bilaterally. 3.  No intraorbital lesions. 4. Mid face and paranasal soft tissue swelling. No well-defined hematoma. CT cervical spine: No fracture or spondylolisthesis. Osteoarthritic change at multiple levels. No disc extrusion or stenosis. Electronically Signed   By: Bretta Bang III M.D.   On: 12/10/2019 15:32    CT ANGIO LOW EXTREM LEFT W &/OR WO CONTRAST  Result Date: 12/10/2019 CLINICAL DATA:  64 year old fell off the ladder with left knee injury. Evaluate for vascular injury. EXAM: CT ANGIOGRAPHY OF THE BILATERAL LOWER EXTREMITIES TECHNIQUE: Multidetector CT imaging of the bilateral lower extremitieswas performed using the standard protocol during bolus administration of intravenous contrast. Multiplanar CT image reconstructions and MIPs were obtained to evaluate the vascular anatomy. CONTRAST:  OMNIPAQUE IOHEXOL 350 MG/ML SOLN COMPARISON:  None. FINDINGS: Right lower extremity inflow: Right common, internal and external iliac arteries are widely patent with mild atherosclerotic disease. Right lower extremity outflow: Right common femoral, right profunda femoral artery and right superficial femoral artery are widely patent. Right popliteal artery is widely patent. Right lower extremity runoff: Three-vessel runoff in the right lower extremity.  Minimal contrast in the distal runoff vessels likely related to timing of the study. No significant atherosclerotic disease. Left lower extremity inflow: Left common, internal and external arteries are widely patent with mild atherosclerotic disease. Left lower extremity outflow: Left common femoral artery is widely patent. Left profunda femoral arteries are patent. Left SFA is widely patent. Left popliteal artery is widely patent without evidence for traumatic injury. Left lower extremity Runoff: Three-vessel runoff in the left lower extremity. Pelvic structures: Normal appearance of the urinary bladder. Visualized bowel structures are unremarkable. Negative for ascites. Left inguinal hernia containing fat. Musculoskeletal: Significant facet arthropathy at the lumbosacral junction particularly on the right. Both hips are located. Pelvic bony ring is intact. No fracture involving the right lower extremity. Comminuted fracture of the left patella. Left patella is  severely comminuted with distracted fracture fragments. Extensive soft tissue swelling around the patellar fracture. The left knee is located. No evidence for active contrast extravasation associated with the fracture. Small lipohemarthrosis associated with the left patella fracture just superior to the patella. Left patella tendon is slightly buckled but appears to be intact. Review of the MIP images confirms the above findings. IMPRESSION: 1. Bilateral lower extremity arteries are patent. No evidence for an arterial injury. 2. Severely comminuted left patellar fracture with associated soft tissue swelling. Electronically Signed   By: Richarda Overlie M.D.   On: 12/10/2019 15:53   CT ANGIO LOW EXTREM RIGHT W &/OR WO CONTRAST  Result Date: 12/10/2019 CLINICAL DATA:  64 year old fell off the ladder with left knee injury. Evaluate for vascular injury. EXAM: CT ANGIOGRAPHY OF THE BILATERAL LOWER EXTREMITIES TECHNIQUE: Multidetector CT imaging of the bilateral lower extremitieswas performed using the standard protocol during bolus administration of intravenous contrast. Multiplanar CT image reconstructions and MIPs were obtained to evaluate the vascular anatomy. CONTRAST:  OMNIPAQUE IOHEXOL 350 MG/ML SOLN COMPARISON:  None. FINDINGS: Right lower extremity inflow: Right common, internal and external iliac arteries are widely patent with mild atherosclerotic disease. Right lower extremity outflow: Right common femoral, right profunda femoral artery and right superficial femoral artery are widely patent. Right popliteal artery is widely patent. Right lower extremity runoff: Three-vessel runoff in the right lower extremity. Minimal contrast in the distal runoff vessels likely related to timing of the study. No significant atherosclerotic disease. Left lower extremity inflow: Left common, internal and external arteries are widely patent with mild atherosclerotic disease. Left lower extremity outflow: Left common femoral  artery is widely patent. Left profunda femoral arteries are patent. Left SFA is widely patent. Left popliteal artery is widely patent without evidence for traumatic injury. Left lower extremity Runoff: Three-vessel runoff in the left lower extremity. Pelvic structures: Normal appearance of the urinary bladder. Visualized bowel structures are unremarkable. Negative for ascites. Left inguinal hernia containing fat. Musculoskeletal: Significant facet arthropathy at the lumbosacral junction particularly on the right. Both hips are located. Pelvic bony ring is intact. No fracture involving the right lower extremity. Comminuted fracture of the left patella. Left patella is severely comminuted with distracted fracture fragments. Extensive soft tissue swelling around the patellar fracture. The left knee is located. No evidence for active contrast extravasation associated with the fracture. Small lipohemarthrosis associated with the left patella fracture just superior to the patella. Left patella tendon is slightly buckled but appears to be intact. Review of the MIP images confirms the above findings. IMPRESSION: 1. Bilateral lower extremity arteries are patent. No evidence for an arterial injury. 2. Severely comminuted left patellar fracture with associated soft  tissue swelling. Electronically Signed   By: Richarda Overlie M.D.   On: 12/10/2019 15:53   DG Pelvis Portable  Result Date: 12/10/2019 CLINICAL DATA:  Pain following fall from ladder EXAM: PORTABLE PELVIS 1-2 VIEWS COMPARISON:  None. FINDINGS: There is no evidence of pelvic fracture or dislocation. There is mild symmetric narrowing of each hip joint. No erosive change. IMPRESSION: No fracture or dislocation. Mild symmetric narrowing each hip joint. Electronically Signed   By: Bretta Bang III M.D.   On: 12/10/2019 14:51   DG Chest Port 1 View  Result Date: 12/10/2019 CLINICAL DATA:  Pain following fall from ladder EXAM: PORTABLE CHEST 1 VIEW COMPARISON:   Jun 05, 2019. FINDINGS: Lungs are clear. Heart size and pulmonary vascularity are normal. No adenopathy. No pneumothorax. There is degenerative change in the thoracic spine. IMPRESSION: Lungs clear.  Cardiac silhouette normal.  No pneumothorax. Electronically Signed   By: Bretta Bang III M.D.   On: 12/10/2019 14:51   DG Knee Left Port  Result Date: 12/10/2019 CLINICAL DATA:  Pain following fall EXAM: PORTABLE LEFT KNEE - 1-2 VIEW COMPARISON:  None. FINDINGS: Frontal and lateral views were obtained. There is a comminuted fracture of the patella with displacement of major fracture fragments by as much as 1.5 cm. No other fracture. No dislocation. No appreciable joint space narrowing. Soft tissue swelling noted anteriorly to the comminuted patellar fracture. IMPRESSION: Comminuted patellar fracture with fracture fragment distraction in the midportion. Less distraction along the more inferior fracture of the patella. No other fractures. No dislocation. No appreciable joint space narrowing. Electronically Signed   By: Bretta Bang III M.D.   On: 12/10/2019 14:53   CT MAXILLOFACIAL WO CONTRAST  Result Date: 12/10/2019 CLINICAL DATA:  Fall from ladder EXAM: CT HEAD WITHOUT CONTRAST CT MAXILLOFACIAL WITHOUT CONTRAST CT CERVICAL SPINE WITHOUT CONTRAST TECHNIQUE: Multidetector CT imaging of the head, cervical spine, and maxillofacial structures were performed using the standard protocol without intravenous contrast. Multiplanar CT image reconstructions of the cervical spine and maxillofacial structures were also generated. COMPARISON:  None. FINDINGS: CT HEAD FINDINGS Brain: Ventricles and sulci are normal in size and configuration. There is no intracranial mass, hemorrhage, extra-axial fluid collection, or midline shift. Brain parenchyma appears unremarkable. There is no demonstrable acute infarct. Vascular: No hyperdense vessels.  No evident vascular calcification. Skull: Bony calvarium appears intact.  There is a small medial right frontal scalp hematoma. Other: Mastoid air cells are clear. CT MAXILLOFACIAL FINDINGS Osseous: There is a comminuted fracture along the distal nasion as well fractures of the anterior right and left nasal bones. There is a minimally displaced fracture of the anterior vomer. No other fractures are evident. No dislocation. No blastic or lytic bone lesions. Temporomandibular joints appear symmetric. Orbits: Orbits appear symmetric bilaterally. No intraorbital lesion evident. Sinuses: There is suspected hemorrhage in the anterior ethmoid air cell regions. There is a retention cyst in the right sphenoid sinus measuring 2.5 x 1.8 cm. Other paranasal sinuses are clear. No bony destruction or expansion. No air-fluid levels. Ostiomeatal complexes are patent bilaterally. There is edema in each inferior nasal turbinate with inferior nares obstruction bilaterally. Small concha bullosa on each side, an anatomic variant. Leftward deviation of anterior nasal septum noted. Soft tissues: Soft tissue swelling present over nasal and mid face regions. No well-defined facial hematoma. No abscess. Salivary glands appear symmetric bilaterally. No adenopathy evident. Tongue and tongue base regions appear normal. Visualized pharynx appears normal. CT CERVICAL SPINE FINDINGS Alignment: There is no appreciable  spondylolisthesis. Skull base and vertebrae: Skull base and craniocervical junction regions appear normal. No evident fracture. No blastic or lytic bone lesions. Soft tissues and spinal canal: Nuchal ligament calcification is present posterior to C4 and C5. prevertebral soft tissues and predental space regions are normal. There is no evident cord or canal hematoma. There is no paraspinous lesion. Disc levels: There is moderate disc space narrowing at C3-4, C4-5, C5-6, and C6-7. There are anterior osteophytes at C3, C4, C5, and C6. There is facet hypertrophy at multiple levels bilaterally. There is mild exit  foraminal narrowing due to bony hypertrophy at C3-4 on the left, at C4-5 on the right, at C5-6 bilaterally, and at C6-7 bilaterally. No disc extrusion or stenosis is appreciable. Uncovertebral spurring is noted at C3, C4, C5, C6, and C7 bilaterally. Upper chest: Visualized upper lung regions are clear. Other: None IMPRESSION: CT head: Small medial right frontal scalp hematoma without underlying fracture. Brain parenchyma appears normal. No mass or hemorrhage. CT maxillofacial: 1. Comminuted anterior nasal fractures. Subtle fracture of the anterior vomer. No other fractures. No dislocations. 2. Probable hemorrhage in the anterior ethmoid regions. Prominent retention cyst in right sphenoid sinus. Edema of the inferior nasal turbinates with obstruction of the inferior nasal cavity medially on each side. Ostiomeatal unit complexes are patent bilaterally. 3.  No intraorbital lesions. 4. Mid face and paranasal soft tissue swelling. No well-defined hematoma. CT cervical spine: No fracture or spondylolisthesis. Osteoarthritic change at multiple levels. No disc extrusion or stenosis. Electronically Signed   By: Bretta Bang III M.D.   On: 12/10/2019 15:32    Procedures Procedures (including critical care time)  Medications Ordered in ED Medications  morphine 4 MG/ML injection 4 mg (4 mg Intravenous Given 12/10/19 1442)  Tdap (BOOSTRIX) injection 0.5 mL (0.5 mLs Intramuscular Given 12/10/19 1444)  iohexol (OMNIPAQUE) 350 MG/ML injection 100 mL (100 mLs Intravenous Contrast Given 12/10/19 1435)  oxyCODONE (Oxy IR/ROXICODONE) immediate release tablet 5 mg (5 mg Oral Given 12/10/19 1719)    ED Course  I have reviewed the triage vital signs and the nursing notes.  Pertinent labs & imaging results that were available during my care of the patient were reviewed by me and considered in my medical decision making (see chart for details).  64 yo male here s/p fall of ladder with left patellar injury and nasal  injury evident from trauma evaluation.  No LOC.  No spinal midline ttp.  No evidence of chest contusion, pelvic or hip injury.  He is neurovascularly intact.  Trauma labs ordered.  CTH, CT Maxillofacial, CT C-spine ordered.  CTA left lower extremity ordered given concern for possible earlier dislocation.   I was informed by radiology that we will need to perform bilateral CTA of the lower extremities to allow radiologies for side by side comparison of images.  4 mg IV morphine ordered for pain NPO for now  Patient otherwise looking well and in good spirits on arrival.  He has requested we do not contact any family at this time.  *  Labs reviewed -  BMP and CBC largely unremarkable.  Covid/FLu screen negative.  Lactate normal.  CT scans reviewed - nasal fracture noted, along with comminuted left patellar fracture.  No evidence of vasculary injury of the lower extremity.  ECG per my interpretation shows a sinus rhythm without acute ischemia.  On reassessment his pain remained under control.  We are pending an orthopedic consultation for his patellar fracture.  Dr Pilar Plate EDP has assumed  care of the patient at 3:30 pm.  Clinical Course as of Dec 10 1810  Thu Dec 10, 2019  1418 ECG reviewed - entered under trauma name, but shows NSR with no acute ischemia   [MT]  1511 Spoke to Dale DurhamMichael Jeffries PA with orthopedics regarding patellar fracture.  Ordered knee immobilizer with staff   [MT]    Clinical Course User Index [MT] Jeymi Hepp, Kermit BaloMatthew J, MD    Final Clinical Impression(s) / ED Diagnoses Final diagnoses:  Fall  Closed nondisplaced comminuted fracture of left patella, initial encounter  Closed fracture of nasal bone, initial encounter    Rx / DC Orders ED Discharge Orders    None       Terald Sleeperrifan, Yasheka Fossett J, MD 12/10/19 [phone]

## 2019-12-10 NOTE — Anesthesia Preprocedure Evaluation (Addendum)
Anesthesia Evaluation  Patient identified by MRN, date of birth, ID band Patient awake    Reviewed: Patient's Chart, lab work & pertinent test results  Airway Mallampati: II  TM Distance: >3 FB Neck ROM: Full    Dental  (+) Teeth Intact   Pulmonary neg pulmonary ROS,    Pulmonary exam normal        Cardiovascular negative cardio ROS   Rhythm:Regular Rate:Normal     Neuro/Psych negative neurological ROS  negative psych ROS   GI/Hepatic negative GI ROS, Neg liver ROS,   Endo/Other  Hypothyroidism   Renal/GU negative Renal ROS  negative genitourinary   Musculoskeletal Left patella fracture   Abdominal (+)  Abdomen: soft. Bowel sounds: normal.  Peds  Hematology negative hematology ROS (+)   Anesthesia Other Findings   Reproductive/Obstetrics                            Anesthesia Physical Anesthesia Plan  ASA: II  Anesthesia Plan: General and Regional   Post-op Pain Management:  Regional for Post-op pain   Induction: Intravenous  PONV Risk Score and Plan: Ondansetron, Dexamethasone and Treatment may vary due to age or medical condition  Airway Management Planned: Mask and LMA  Additional Equipment: None  Intra-op Plan:   Post-operative Plan: Extubation in OR  Informed Consent: I have reviewed the patients History and Physical, chart, labs and discussed the procedure including the risks, benefits and alternatives for the proposed anesthesia with the patient or authorized representative who has indicated his/her understanding and acceptance.     Dental advisory given  Plan Discussed with: CRNA  Anesthesia Plan Comments: (Lab Results      Component                Value               Date                      WBC                      5.9                 12/10/2019                HGB                      12.9 (L)            12/10/2019                HCT                       38.0 (L)            12/10/2019                MCV                      90.7                12/10/2019                PLT                      198  12/10/2019          )       Anesthesia Quick Evaluation

## 2019-12-10 NOTE — Progress Notes (Signed)
Orthopedic Tech Progress Note Patient Details:  Robert [name] 11-28-1955 [id number]  Ortho Devices Type of Ortho Device: Knee Immobilizer Ortho Device/Splint Location: RLE Ortho Device/Splint Interventions: Application, Ordered, Adjustment   Post Interventions Patient Tolerated: Well   [name] [name] 12/10/2019, 5:18 PM

## 2019-12-10 NOTE — Consult Note (Signed)
Reason for Consult:Left patella fx Referring Physician: Carlean JewsM Trifan Time called: 1511 Time at bedside: 1532   Layla BarterLarry [name] is an 64 y.o. male.  HPI: Peyton NajjarLarry was on a ladder painting a gazebo. As he was coming down the ladder slipped out from under him and he fell about 6-7 feet. He had immediate knee and facial pain and could not bear weight. He was brought to the ED as a level 2 trauma activation. X-rays showed a left patella fx and orthopedic surgery was consulted. He works as a Education officer, environmentalpastor.  No past medical history on file.  No family history on file.  Social History:  has no history on file for tobacco use, alcohol use, and drug use.  Allergies: No Known Allergies  Medications: I have reviewed the patient's current medications.  Results for orders placed or performed during the hospital encounter of 12/10/19 (from the past 48 hour(s))  Comprehensive metabolic panel     Status: Abnormal   Collection Time: 12/10/19  2:10 PM  Result Value Ref Range   Sodium 141 135 - 145 mmol/L   Potassium 4.0 3.5 - 5.1 mmol/L   Chloride 106 98 - 111 mmol/L   CO2 25 22 - 32 mmol/L   Glucose, Bld 99 70 - 99 mg/dL    Comment: Glucose reference range applies only to samples taken after fasting for at least 8 hours.   BUN 12 8 - 23 mg/dL   Creatinine, Ser 1.611.10 0.61 - 1.24 mg/dL   Calcium 9.1 8.9 - 09.610.3 mg/dL   Total Protein 7.9 6.5 - 8.1 g/dL   Albumin 4.1 3.5 - 5.0 g/dL   AST 49 (H) 15 - 41 U/L   ALT 31 0 - 44 U/L   Alkaline Phosphatase 57 38 - 126 U/L   Total Bilirubin 0.8 0.3 - 1.2 mg/dL   GFR, Estimated >04>60 >54>60 mL/min    Comment: (NOTE) Calculated using the CKD-EPI Creatinine Equation (2021)    Anion gap 10 5 - 15    Comment: Performed at [facility], [address] [place], [place] [address]  CBC     Status: Abnormal   Collection Time: 12/10/19  2:10 PM  Result Value Ref Range   WBC 5.9 4.0 - 10.5 K/uL   RBC 4.08 (L) 4.22 - 5.81 MIL/uL   Hemoglobin 12.7 (L) 13.0 - 17.0 g/dL   HCT  91.437.0 (L) 39 - 52 %   MCV 90.7 80.0 - 100.0 fL   MCH 31.1 26.0 - 34.0 pg   MCHC 34.3 30.0 - 36.0 g/dL   RDW 78.212.9 95.611.5 - 21.315.5 %   Platelets 198 150 - 400 K/uL   nRBC 0.0 0.0 - 0.2 %    Comment: Performed at [facility], [address] [place], [place] [address]  Ethanol     Status: None   Collection Time: 12/10/19  2:10 PM  Result Value Ref Range   Alcohol, Ethyl (B) <10 <10 mg/dL    Comment: (NOTE) Lowest detectable limit for serum alcohol is 10 mg/dL.  For medical purposes only. Performed at [facility], [address] [place], [place] [address]   Lactic acid, plasma     Status: None   Collection Time: 12/10/19  2:10 PM  Result Value Ref Range   Lactic Acid, Venous 1.1 0.5 - 1.9 mmol/L    Comment: Performed at [facility], [address] [place], [place] [address]  Protime-INR  Status: None   Collection Time: 12/10/19  2:10 PM  Result Value Ref Range   Prothrombin Time 13.8 11.4 - 15.2 seconds   INR 1.1 0.8 - 1.2    Comment: (NOTE) INR goal varies based on device and disease states. Performed at [facility], [address], [place], [place] [address]   Sample to Blood Bank     Status: None   Collection Time: 12/10/19  2:10 PM  Result Value Ref Range   Blood Bank Specimen SAMPLE AVAILABLE FOR TESTING    Sample Expiration      12/11/2019,2359 Performed at [facility], [address], [place], [place] [address]   Respiratory Panel by RT PCR (Flu A&B, Covid) - Nasopharyngeal Swab     Status: None   Collection Time: 12/10/19  2:10 PM   Specimen: Nasopharyngeal Swab; Nasopharyngeal(NP) swabs in vial transport medium  Result Value Ref Range   SARS Coronavirus 2 by RT PCR NEGATIVE NEGATIVE    Comment: (NOTE) SARS-CoV-2 target nucleic acids are NOT DETECTED.  The SARS-CoV-2 RNA is generally detectable in upper respiratoy specimens during the acute phase of infection. The lowest concentration of SARS-CoV-2 viral copies this assay can  detect is 131 copies/mL. A negative result does not preclude SARS-Cov-2 infection and should not be used as the sole basis for treatment or other patient management decisions. A negative result may occur with  improper specimen collection/handling, submission of specimen other than nasopharyngeal swab, presence of viral mutation(s) within the areas targeted by this assay, and inadequate number of viral copies (<131 copies/mL). A negative result must be combined with clinical observations, patient history, and epidemiological information. The expected result is Negative.  Fact Sheet for Patients:  https://www.moore.com/  Fact Sheet for Healthcare Providers:  https://www.young.biz/  This test is no t yet approved or cleared by the [place] FDA and  has been authorized for detection and/or diagnosis of SARS-CoV-2 by FDA under an Emergency Use Authorization (EUA). This EUA will remain  in effect (meaning this test can be used) for the duration of the COVID-19 declaration under Section 564(b)(1) of the Act, 21 U.S.C. section 360bbb-3(b)(1), unless the authorization is terminated or revoked sooner.     Influenza A by PCR NEGATIVE NEGATIVE   Influenza B by PCR NEGATIVE NEGATIVE    Comment: (NOTE) The Xpert Xpress SARS-CoV-2/FLU/RSV assay is intended as an aid in  the diagnosis of influenza from Nasopharyngeal swab specimens and  should not be used as a sole basis for treatment. Nasal washings and  aspirates are unacceptable for Xpert Xpress SARS-CoV-2/FLU/RSV  testing.  Fact Sheet for Patients: https://www.moore.com/  Fact Sheet for Healthcare Providers: https://www.young.biz/  This test is not yet approved or cleared by the [place] FDA and  has been authorized for detection and/or diagnosis of SARS-CoV-2 by  FDA under an Emergency Use Authorization (EUA). This EUA will remain  in effect  (meaning this test can be used) for the duration of the  Covid-19 declaration under Section 564(b)(1) of the Act, 21  U.S.C. section 360bbb-3(b)(1), unless the authorization is  terminated or revoked. Performed at [facility], [address] [place], [place] [address]   I-Stat Chem 8, ED     Status: Abnormal   Collection Time: 12/10/19  2:23 PM  Result Value Ref Range   Sodium 141 135 - 145 mmol/L   Potassium 3.9 3.5 - 5.1 mmol/L   Chloride 105 98 - 111 mmol/L   BUN  13 8 - 23 mg/dL   Creatinine, Ser 0.80 0.61 - 1.24 mg/dL   Glucose, Bld 91 70 - 99 mg/dL    Comment: Glucose reference range applies only to samples taken after fasting for at least 8 hours.   Calcium, Ion 1.11 (L) 1.15 - 1.40 mmol/L   TCO2 25 22 - 32 mmol/L   Hemoglobin 12.9 (L) 13.0 - 17.0 g/dL   HCT 22.3 (L) 39 - 52 %    CT HEAD WO CONTRAST  Result Date: 12/10/2019 CLINICAL DATA:  Fall from ladder EXAM: CT HEAD WITHOUT CONTRAST CT MAXILLOFACIAL WITHOUT CONTRAST CT CERVICAL SPINE WITHOUT CONTRAST TECHNIQUE: Multidetector CT imaging of the head, cervical spine, and maxillofacial structures were performed using the standard protocol without intravenous contrast. Multiplanar CT image reconstructions of the cervical spine and maxillofacial structures were also generated. COMPARISON:  None. FINDINGS: CT HEAD FINDINGS Brain: Ventricles and sulci are normal in size and configuration. There is no intracranial mass, hemorrhage, extra-axial fluid collection, or midline shift. Brain parenchyma appears unremarkable. There is no demonstrable acute infarct. Vascular: No hyperdense vessels.  No evident vascular calcification. Skull: Bony calvarium appears intact. There is a small medial right frontal scalp hematoma. Other: Mastoid air cells are clear. CT MAXILLOFACIAL FINDINGS Osseous: There is a comminuted fracture along the distal nasion as well fractures of the anterior right and left nasal bones. There is a minimally displaced  fracture of the anterior vomer. No other fractures are evident. No dislocation. No blastic or lytic bone lesions. Temporomandibular joints appear symmetric. Orbits: Orbits appear symmetric bilaterally. No intraorbital lesion evident. Sinuses: There is suspected hemorrhage in the anterior ethmoid air cell regions. There is a retention cyst in the right sphenoid sinus measuring 2.5 x 1.8 cm. Other paranasal sinuses are clear. No bony destruction or expansion. No air-fluid levels. Ostiomeatal complexes are patent bilaterally. There is edema in each inferior nasal turbinate with inferior nares obstruction bilaterally. Small concha bullosa on each side, an anatomic variant. Leftward deviation of anterior nasal septum noted. Soft tissues: Soft tissue swelling present over nasal and mid face regions. No well-defined facial hematoma. No abscess. Salivary glands appear symmetric bilaterally. No adenopathy evident. Tongue and tongue base regions appear normal. Visualized pharynx appears normal. CT CERVICAL SPINE FINDINGS Alignment: There is no appreciable spondylolisthesis. Skull base and vertebrae: Skull base and craniocervical junction regions appear normal. No evident fracture. No blastic or lytic bone lesions. Soft tissues and spinal canal: Nuchal ligament calcification is present posterior to C4 and C5. prevertebral soft tissues and predental space regions are normal. There is no evident cord or canal hematoma. There is no paraspinous lesion. Disc levels: There is moderate disc space narrowing at C3-4, C4-5, C5-6, and C6-7. There are anterior osteophytes at C3, C4, C5, and C6. There is facet hypertrophy at multiple levels bilaterally. There is mild exit foraminal narrowing due to bony hypertrophy at C3-4 on the left, at C4-5 on the right, at C5-6 bilaterally, and at C6-7 bilaterally. No disc extrusion or stenosis is appreciable. Uncovertebral spurring is noted at C3, C4, C5, C6, and C7 bilaterally. Upper chest:  Visualized upper lung regions are clear. Other: None IMPRESSION: CT head: Small medial right frontal scalp hematoma without underlying fracture. Brain parenchyma appears normal. No mass or hemorrhage. CT maxillofacial: 1. Comminuted anterior nasal fractures. Subtle fracture of the anterior vomer. No other fractures. No dislocations. 2. Probable hemorrhage in the anterior ethmoid regions. Prominent retention cyst in right sphenoid sinus. Edema of the inferior nasal turbinates  with obstruction of the inferior nasal cavity medially on each side. Ostiomeatal unit complexes are patent bilaterally. 3.  No intraorbital lesions. 4. Mid face and paranasal soft tissue swelling. No well-defined hematoma. CT cervical spine: No fracture or spondylolisthesis. Osteoarthritic change at multiple levels. No disc extrusion or stenosis. Electronically Signed   By: Bretta Bang III M.D.   On: 12/10/2019 15:32   CT CERVICAL SPINE WO CONTRAST  Result Date: 12/10/2019 CLINICAL DATA:  Fall from ladder EXAM: CT HEAD WITHOUT CONTRAST CT MAXILLOFACIAL WITHOUT CONTRAST CT CERVICAL SPINE WITHOUT CONTRAST TECHNIQUE: Multidetector CT imaging of the head, cervical spine, and maxillofacial structures were performed using the standard protocol without intravenous contrast. Multiplanar CT image reconstructions of the cervical spine and maxillofacial structures were also generated. COMPARISON:  None. FINDINGS: CT HEAD FINDINGS Brain: Ventricles and sulci are normal in size and configuration. There is no intracranial mass, hemorrhage, extra-axial fluid collection, or midline shift. Brain parenchyma appears unremarkable. There is no demonstrable acute infarct. Vascular: No hyperdense vessels.  No evident vascular calcification. Skull: Bony calvarium appears intact. There is a small medial right frontal scalp hematoma. Other: Mastoid air cells are clear. CT MAXILLOFACIAL FINDINGS Osseous: There is a comminuted fracture along the distal nasion as  well fractures of the anterior right and left nasal bones. There is a minimally displaced fracture of the anterior vomer. No other fractures are evident. No dislocation. No blastic or lytic bone lesions. Temporomandibular joints appear symmetric. Orbits: Orbits appear symmetric bilaterally. No intraorbital lesion evident. Sinuses: There is suspected hemorrhage in the anterior ethmoid air cell regions. There is a retention cyst in the right sphenoid sinus measuring 2.5 x 1.8 cm. Other paranasal sinuses are clear. No bony destruction or expansion. No air-fluid levels. Ostiomeatal complexes are patent bilaterally. There is edema in each inferior nasal turbinate with inferior nares obstruction bilaterally. Small concha bullosa on each side, an anatomic variant. Leftward deviation of anterior nasal septum noted. Soft tissues: Soft tissue swelling present over nasal and mid face regions. No well-defined facial hematoma. No abscess. Salivary glands appear symmetric bilaterally. No adenopathy evident. Tongue and tongue base regions appear normal. Visualized pharynx appears normal. CT CERVICAL SPINE FINDINGS Alignment: There is no appreciable spondylolisthesis. Skull base and vertebrae: Skull base and craniocervical junction regions appear normal. No evident fracture. No blastic or lytic bone lesions. Soft tissues and spinal canal: Nuchal ligament calcification is present posterior to C4 and C5. prevertebral soft tissues and predental space regions are normal. There is no evident cord or canal hematoma. There is no paraspinous lesion. Disc levels: There is moderate disc space narrowing at C3-4, C4-5, C5-6, and C6-7. There are anterior osteophytes at C3, C4, C5, and C6. There is facet hypertrophy at multiple levels bilaterally. There is mild exit foraminal narrowing due to bony hypertrophy at C3-4 on the left, at C4-5 on the right, at C5-6 bilaterally, and at C6-7 bilaterally. No disc extrusion or stenosis is appreciable.  Uncovertebral spurring is noted at C3, C4, C5, C6, and C7 bilaterally. Upper chest: Visualized upper lung regions are clear. Other: None IMPRESSION: CT head: Small medial right frontal scalp hematoma without underlying fracture. Brain parenchyma appears normal. No mass or hemorrhage. CT maxillofacial: 1. Comminuted anterior nasal fractures. Subtle fracture of the anterior vomer. No other fractures. No dislocations. 2. Probable hemorrhage in the anterior ethmoid regions. Prominent retention cyst in right sphenoid sinus. Edema of the inferior nasal turbinates with obstruction of the inferior nasal cavity medially on each side. Ostiomeatal unit  complexes are patent bilaterally. 3.  No intraorbital lesions. 4. Mid face and paranasal soft tissue swelling. No well-defined hematoma. CT cervical spine: No fracture or spondylolisthesis. Osteoarthritic change at multiple levels. No disc extrusion or stenosis. Electronically Signed   By: Bretta Bang III M.D.   On: 12/10/2019 15:32   DG Pelvis Portable  Result Date: 12/10/2019 CLINICAL DATA:  Pain following fall from ladder EXAM: PORTABLE PELVIS 1-2 VIEWS COMPARISON:  None. FINDINGS: There is no evidence of pelvic fracture or dislocation. There is mild symmetric narrowing of each hip joint. No erosive change. IMPRESSION: No fracture or dislocation. Mild symmetric narrowing each hip joint. Electronically Signed   By: Bretta Bang III M.D.   On: 12/10/2019 14:51   DG Chest Port 1 View  Result Date: 12/10/2019 CLINICAL DATA:  Pain following fall from ladder EXAM: PORTABLE CHEST 1 VIEW COMPARISON:  Jun 05, 2019. FINDINGS: Lungs are clear. Heart size and pulmonary vascularity are normal. No adenopathy. No pneumothorax. There is degenerative change in the thoracic spine. IMPRESSION: Lungs clear.  Cardiac silhouette normal.  No pneumothorax. Electronically Signed   By: Bretta Bang III M.D.   On: 12/10/2019 14:51   DG Knee Left Port  Result Date:  12/10/2019 CLINICAL DATA:  Pain following fall EXAM: PORTABLE LEFT KNEE - 1-2 VIEW COMPARISON:  None. FINDINGS: Frontal and lateral views were obtained. There is a comminuted fracture of the patella with displacement of major fracture fragments by as much as 1.5 cm. No other fracture. No dislocation. No appreciable joint space narrowing. Soft tissue swelling noted anteriorly to the comminuted patellar fracture. IMPRESSION: Comminuted patellar fracture with fracture fragment distraction in the midportion. Less distraction along the more inferior fracture of the patella. No other fractures. No dislocation. No appreciable joint space narrowing. Electronically Signed   By: Bretta Bang III M.D.   On: 12/10/2019 14:53   CT MAXILLOFACIAL WO CONTRAST  Result Date: 12/10/2019 CLINICAL DATA:  Fall from ladder EXAM: CT HEAD WITHOUT CONTRAST CT MAXILLOFACIAL WITHOUT CONTRAST CT CERVICAL SPINE WITHOUT CONTRAST TECHNIQUE: Multidetector CT imaging of the head, cervical spine, and maxillofacial structures were performed using the standard protocol without intravenous contrast. Multiplanar CT image reconstructions of the cervical spine and maxillofacial structures were also generated. COMPARISON:  None. FINDINGS: CT HEAD FINDINGS Brain: Ventricles and sulci are normal in size and configuration. There is no intracranial mass, hemorrhage, extra-axial fluid collection, or midline shift. Brain parenchyma appears unremarkable. There is no demonstrable acute infarct. Vascular: No hyperdense vessels.  No evident vascular calcification. Skull: Bony calvarium appears intact. There is a small medial right frontal scalp hematoma. Other: Mastoid air cells are clear. CT MAXILLOFACIAL FINDINGS Osseous: There is a comminuted fracture along the distal nasion as well fractures of the anterior right and left nasal bones. There is a minimally displaced fracture of the anterior vomer. No other fractures are evident. No dislocation. No  blastic or lytic bone lesions. Temporomandibular joints appear symmetric. Orbits: Orbits appear symmetric bilaterally. No intraorbital lesion evident. Sinuses: There is suspected hemorrhage in the anterior ethmoid air cell regions. There is a retention cyst in the right sphenoid sinus measuring 2.5 x 1.8 cm. Other paranasal sinuses are clear. No bony destruction or expansion. No air-fluid levels. Ostiomeatal complexes are patent bilaterally. There is edema in each inferior nasal turbinate with inferior nares obstruction bilaterally. Small concha bullosa on each side, an anatomic variant. Leftward deviation of anterior nasal septum noted. Soft tissues: Soft tissue swelling present over nasal and mid  face regions. No well-defined facial hematoma. No abscess. Salivary glands appear symmetric bilaterally. No adenopathy evident. Tongue and tongue base regions appear normal. Visualized pharynx appears normal. CT CERVICAL SPINE FINDINGS Alignment: There is no appreciable spondylolisthesis. Skull base and vertebrae: Skull base and craniocervical junction regions appear normal. No evident fracture. No blastic or lytic bone lesions. Soft tissues and spinal canal: Nuchal ligament calcification is present posterior to C4 and C5. prevertebral soft tissues and predental space regions are normal. There is no evident cord or canal hematoma. There is no paraspinous lesion. Disc levels: There is moderate disc space narrowing at C3-4, C4-5, C5-6, and C6-7. There are anterior osteophytes at C3, C4, C5, and C6. There is facet hypertrophy at multiple levels bilaterally. There is mild exit foraminal narrowing due to bony hypertrophy at C3-4 on the left, at C4-5 on the right, at C5-6 bilaterally, and at C6-7 bilaterally. No disc extrusion or stenosis is appreciable. Uncovertebral spurring is noted at C3, C4, C5, C6, and C7 bilaterally. Upper chest: Visualized upper lung regions are clear. Other: None IMPRESSION: CT head: Small medial right  frontal scalp hematoma without underlying fracture. Brain parenchyma appears normal. No mass or hemorrhage. CT maxillofacial: 1. Comminuted anterior nasal fractures. Subtle fracture of the anterior vomer. No other fractures. No dislocations. 2. Probable hemorrhage in the anterior ethmoid regions. Prominent retention cyst in right sphenoid sinus. Edema of the inferior nasal turbinates with obstruction of the inferior nasal cavity medially on each side. Ostiomeatal unit complexes are patent bilaterally. 3.  No intraorbital lesions. 4. Mid face and paranasal soft tissue swelling. No well-defined hematoma. CT cervical spine: No fracture or spondylolisthesis. Osteoarthritic change at multiple levels. No disc extrusion or stenosis. Electronically Signed   By: Bretta Bang III M.D.   On: 12/10/2019 15:32    Review of Systems  HENT: Positive for facial swelling. Negative for ear discharge, ear pain, hearing loss and tinnitus.   Eyes: Negative for photophobia and pain.  Respiratory: Negative for cough and shortness of breath.   Cardiovascular: Negative for chest pain.  Gastrointestinal: Negative for abdominal pain, nausea and vomiting.  Genitourinary: Negative for dysuria, flank pain, frequency and urgency.  Musculoskeletal: Positive for arthralgias (Left knee). Negative for back pain, myalgias and neck pain.  Neurological: Negative for dizziness and headaches.  Hematological: Does not bruise/bleed easily.  Psychiatric/Behavioral: The patient is not nervous/anxious.    Blood pressure (!) 146/91, pulse 73, temperature 97.6 F (36.4 C), temperature source Oral, resp. rate 14, height 5\' 10"  (1.778 m), weight 84.4 kg, SpO2 96 %. Physical Exam Constitutional:      General: He is not in acute distress.    Appearance: He is well-developed. He is not diaphoretic.  HENT:     Head: Normocephalic.  Eyes:     General: No scleral icterus.       Right eye: No discharge.        Left eye: No discharge.      Conjunctiva/sclera: Conjunctivae normal.  Neck:     Comments: C-collar Cardiovascular:     Rate and Rhythm: Normal rate and regular rhythm.  Pulmonary:     Effort: Pulmonary effort is normal. No respiratory distress.  Musculoskeletal:     Comments: LLE No traumatic wounds, ecchymosis, or rash  KI in place, mod TTP  Mod knee effusion  Sens DPN, SPN, TN intact  Motor EHL, ext, flex, evers 5/5  DP 2+, PT 1+, No significant edema  Skin:    General: Skin is  warm and dry.  Neurological:     Mental Status: He is alert.  Psychiatric:        Behavior: Behavior normal.     Assessment/Plan: Left patella fx -- Will need ORIF. If ENT recommends surgery for facial fxs then would recommend trauma admit and ortho and ENT can try to coordinate surgical times tomorrow. If so then please make NPO after MN. If not then can discharge home in [place], [place] and f/u with Dr. Carola Frost or [place] in office next week to discuss surgery. Hypothyroidism HLD    Freeman Caldron, PA-C Orthopedic Surgery [phone] 12/10/2019, 3:51 PM

## 2019-12-10 NOTE — Progress Notes (Signed)
Pt arrived to unit, VSS, a&ox4, pt given supper and instructed to be NPO at MN per order. Pt oriented to room and equipment, all questions answered. Pt admitted to telemetry.

## 2019-12-10 NOTE — H&P (Signed)
Please see orthopaedic consult note from 12/10/19 for full H&P.

## 2019-12-10 NOTE — ACP (Advance Care Planning) (Signed)
FULL code

## 2019-12-10 NOTE — ED Notes (Signed)
Patient transported to CT 

## 2019-12-10 NOTE — H&P (View-Only) (Signed)
Reason for Consult:Left patella fx Referring Physician: M Trifan Time called: 1511 Time at bedside: 1532   Robert [name] is an 63 y.o. male.  HPI: Robert [name] was on a ladder painting a gazebo. As he was coming down the ladder slipped out from under him and he fell about 6-7 feet. He had immediate knee and facial pain and could not bear weight. He was brought to the ED as a level 2 trauma activation. X-rays showed a left patella fx and orthopedic surgery was consulted. He works as a pastor.  No past medical history on file.  No family history on file.  Social History:  has no history on file for tobacco use, alcohol use, and drug use.  Allergies: No Known Allergies  Medications: I have reviewed the patient's current medications.  Results for orders placed or performed during the hospital encounter of 12/10/19 (from the past 48 hour(s))  Comprehensive metabolic panel     Status: Abnormal   Collection Time: 12/10/19  2:10 PM  Result Value Ref Range   Sodium 141 135 - 145 mmol/L   Potassium 4.0 3.5 - 5.1 mmol/L   Chloride 106 98 - 111 mmol/L   CO2 25 22 - 32 mmol/L   Glucose, Bld 99 70 - 99 mg/dL    Comment: Glucose reference range applies only to samples taken after fasting for at least 8 hours.   BUN 12 8 - 23 mg/dL   Creatinine, Ser 1.10 0.61 - 1.24 mg/dL   Calcium 9.1 8.9 - 10.3 mg/dL   Total Protein 7.9 6.5 - 8.1 g/dL   Albumin 4.1 3.5 - 5.0 g/dL   AST 49 (H) 15 - 41 U/L   ALT 31 0 - 44 U/L   Alkaline Phosphatase 57 38 - 126 U/L   Total Bilirubin 0.8 0.3 - 1.2 mg/dL   GFR, Estimated >60 >60 mL/min    Comment: (NOTE) Calculated using the CKD-EPI Creatinine Equation (2021)    Anion gap 10 5 - 15    Comment: Performed at [facility], [address] [place], [place] [address]  CBC     Status: Abnormal   Collection Time: 12/10/19  2:10 PM  Result Value Ref Range   WBC 5.9 4.0 - 10.5 K/uL   RBC 4.08 (L) 4.22 - 5.81 MIL/uL   Hemoglobin 12.7 (L) 13.0 - 17.0 g/dL   HCT  37.0 (L) 39 - 52 %   MCV 90.7 80.0 - 100.0 fL   MCH 31.1 26.0 - 34.0 pg   MCHC 34.3 30.0 - 36.0 g/dL   RDW 12.9 11.5 - 15.5 %   Platelets 198 150 - 400 K/uL   nRBC 0.0 0.0 - 0.2 %    Comment: Performed at [facility], [address] [place], [place] [address]  Ethanol     Status: None   Collection Time: 12/10/19  2:10 PM  Result Value Ref Range   Alcohol, Ethyl (B) <10 <10 mg/dL    Comment: (NOTE) Lowest detectable limit for serum alcohol is 10 mg/dL.  For medical purposes only. Performed at [facility], [address] [place], [place] [address]   Lactic acid, plasma     Status: None   Collection Time: 12/10/19  2:10 PM  Result Value Ref Range   Lactic Acid, Venous 1.1 0.5 - 1.9 mmol/L    Comment: Performed at [facility], [address] [place], [place] [address]  Protime-INR       Status: None   Collection Time: 12/10/19  2:10 PM  Result Value Ref Range   Prothrombin Time 13.8 11.4 - 15.2 seconds   INR 1.1 0.8 - 1.2    Comment: (NOTE) INR goal varies based on device and disease states. Performed at [facility], [address] [place], [place] [address]   Sample to Blood Bank     Status: None   Collection Time: 12/10/19  2:10 PM  Result Value Ref Range   Blood Bank Specimen SAMPLE AVAILABLE FOR TESTING    Sample Expiration      12/11/2019,2359 Performed at [facility], [address] [place], [place] [address]   Respiratory Panel by RT PCR (Flu A&B, Covid) - Nasopharyngeal Swab     Status: None   Collection Time: 12/10/19  2:10 PM   Specimen: Nasopharyngeal Swab; Nasopharyngeal(NP) swabs in vial transport medium  Result Value Ref Range   SARS Coronavirus 2 by RT PCR NEGATIVE NEGATIVE    Comment: (NOTE) SARS-CoV-2 target nucleic acids are NOT DETECTED.  The SARS-CoV-2 RNA is generally detectable in upper respiratoy specimens during the acute phase of infection. The lowest concentration of SARS-CoV-2 viral copies this assay can  detect is 131 copies/mL. A negative result does not preclude SARS-Cov-2 infection and should not be used as the sole basis for treatment or other patient management decisions. A negative result may occur with  improper specimen collection/handling, submission of specimen other than nasopharyngeal swab, presence of viral mutation(s) within the areas targeted by this assay, and inadequate number of viral copies (<131 copies/mL). A negative result must be combined with clinical observations, patient history, and epidemiological information. The expected result is Negative.  Fact Sheet for Patients:  https://www.fda.gov/media/142436/download  Fact Sheet for Healthcare Providers:  https://www.fda.gov/media/142435/download  This test is no t yet approved or cleared by the [place] FDA and  has been authorized for detection and/or diagnosis of SARS-CoV-2 by FDA under an Emergency Use Authorization (EUA). This EUA will remain  in effect (meaning this test can be used) for the duration of the COVID-19 declaration under Section 564(b)(1) of the Act, 21 U.S.C. section 360bbb-3(b)(1), unless the authorization is terminated or revoked sooner.     Influenza A by PCR NEGATIVE NEGATIVE   Influenza B by PCR NEGATIVE NEGATIVE    Comment: (NOTE) The Xpert Xpress SARS-CoV-2/FLU/RSV assay is intended as an aid in  the diagnosis of influenza from Nasopharyngeal swab specimens and  should not be used as a sole basis for treatment. Nasal washings and  aspirates are unacceptable for Xpert Xpress SARS-CoV-2/FLU/RSV  testing.  Fact Sheet for Patients: https://www.fda.gov/media/142436/download  Fact Sheet for Healthcare Providers: https://www.fda.gov/media/142435/download  This test is not yet approved or cleared by the [place] FDA and  has been authorized for detection and/or diagnosis of SARS-CoV-2 by  FDA under an Emergency Use Authorization (EUA). This EUA will remain  in effect  (meaning this test can be used) for the duration of the  Covid-19 declaration under Section 564(b)(1) of the Act, 21  U.S.C. section 360bbb-3(b)(1), unless the authorization is  terminated or revoked. Performed at [facility], [address] [place], [place] [address]   I-Stat Chem 8, ED     Status: Abnormal   Collection Time: 12/10/19  2:23 PM  Result Value Ref Range   Sodium 141 135 - 145 mmol/L   Potassium 3.9 3.5 - 5.1 mmol/L   Chloride 105 98 - 111 mmol/L   BUN   13 8 - 23 mg/dL   Creatinine, Ser 1.00 0.61 - 1.24 mg/dL   Glucose, Bld 91 70 - 99 mg/dL    Comment: Glucose reference range applies only to samples taken after fasting for at least 8 hours.   Calcium, Ion 1.11 (L) 1.15 - 1.40 mmol/L   TCO2 25 22 - 32 mmol/L   Hemoglobin 12.9 (L) 13.0 - 17.0 g/dL   HCT 38.0 (L) 39 - 52 %    CT HEAD WO CONTRAST  Result Date: 12/10/2019 CLINICAL DATA:  Fall from ladder EXAM: CT HEAD WITHOUT CONTRAST CT MAXILLOFACIAL WITHOUT CONTRAST CT CERVICAL SPINE WITHOUT CONTRAST TECHNIQUE: Multidetector CT imaging of the head, cervical spine, and maxillofacial structures were performed using the standard protocol without intravenous contrast. Multiplanar CT image reconstructions of the cervical spine and maxillofacial structures were also generated. COMPARISON:  None. FINDINGS: CT HEAD FINDINGS Brain: Ventricles and sulci are normal in size and configuration. There is no intracranial mass, hemorrhage, extra-axial fluid collection, or midline shift. Brain parenchyma appears unremarkable. There is no demonstrable acute infarct. Vascular: No hyperdense vessels.  No evident vascular calcification. Skull: Bony calvarium appears intact. There is a small medial right frontal scalp hematoma. Other: Mastoid air cells are clear. CT MAXILLOFACIAL FINDINGS Osseous: There is a comminuted fracture along the distal nasion as well fractures of the anterior right and left nasal bones. There is a minimally displaced  fracture of the anterior vomer. No other fractures are evident. No dislocation. No blastic or lytic bone lesions. Temporomandibular joints appear symmetric. Orbits: Orbits appear symmetric bilaterally. No intraorbital lesion evident. Sinuses: There is suspected hemorrhage in the anterior ethmoid air cell regions. There is a retention cyst in the right sphenoid sinus measuring 2.5 x 1.8 cm. Other paranasal sinuses are clear. No bony destruction or expansion. No air-fluid levels. Ostiomeatal complexes are patent bilaterally. There is edema in each inferior nasal turbinate with inferior nares obstruction bilaterally. Small concha bullosa on each side, an anatomic variant. Leftward deviation of anterior nasal septum noted. Soft tissues: Soft tissue swelling present over nasal and mid face regions. No well-defined facial hematoma. No abscess. Salivary glands appear symmetric bilaterally. No adenopathy evident. Tongue and tongue base regions appear normal. Visualized pharynx appears normal. CT CERVICAL SPINE FINDINGS Alignment: There is no appreciable spondylolisthesis. Skull base and vertebrae: Skull base and craniocervical junction regions appear normal. No evident fracture. No blastic or lytic bone lesions. Soft tissues and spinal canal: Nuchal ligament calcification is present posterior to C4 and C5. prevertebral soft tissues and predental space regions are normal. There is no evident cord or canal hematoma. There is no paraspinous lesion. Disc levels: There is moderate disc space narrowing at C3-4, C4-5, C5-6, and C6-7. There are anterior osteophytes at C3, C4, C5, and C6. There is facet hypertrophy at multiple levels bilaterally. There is mild exit foraminal narrowing due to bony hypertrophy at C3-4 on the left, at C4-5 on the right, at C5-6 bilaterally, and at C6-7 bilaterally. No disc extrusion or stenosis is appreciable. Uncovertebral spurring is noted at C3, C4, C5, C6, and C7 bilaterally. Upper chest:  Visualized upper lung regions are clear. Other: None IMPRESSION: CT head: Small medial right frontal scalp hematoma without underlying fracture. Brain parenchyma appears normal. No mass or hemorrhage. CT maxillofacial: 1. Comminuted anterior nasal fractures. Subtle fracture of the anterior vomer. No other fractures. No dislocations. 2. Probable hemorrhage in the anterior ethmoid regions. Prominent retention cyst in right sphenoid sinus. Edema of the inferior nasal turbinates   with obstruction of the inferior nasal cavity medially on each side. Ostiomeatal unit complexes are patent bilaterally. 3.  No intraorbital lesions. 4. Mid face and paranasal soft tissue swelling. No well-defined hematoma. CT cervical spine: No fracture or spondylolisthesis. Osteoarthritic change at multiple levels. No disc extrusion or stenosis. Electronically Signed   By: William  Woodruff III M.D.   On: 12/10/2019 15:32   CT CERVICAL SPINE WO CONTRAST  Result Date: 12/10/2019 CLINICAL DATA:  Fall from ladder EXAM: CT HEAD WITHOUT CONTRAST CT MAXILLOFACIAL WITHOUT CONTRAST CT CERVICAL SPINE WITHOUT CONTRAST TECHNIQUE: Multidetector CT imaging of the head, cervical spine, and maxillofacial structures were performed using the standard protocol without intravenous contrast. Multiplanar CT image reconstructions of the cervical spine and maxillofacial structures were also generated. COMPARISON:  None. FINDINGS: CT HEAD FINDINGS Brain: Ventricles and sulci are normal in size and configuration. There is no intracranial mass, hemorrhage, extra-axial fluid collection, or midline shift. Brain parenchyma appears unremarkable. There is no demonstrable acute infarct. Vascular: No hyperdense vessels.  No evident vascular calcification. Skull: Bony calvarium appears intact. There is a small medial right frontal scalp hematoma. Other: Mastoid air cells are clear. CT MAXILLOFACIAL FINDINGS Osseous: There is a comminuted fracture along the distal nasion as  well fractures of the anterior right and left nasal bones. There is a minimally displaced fracture of the anterior vomer. No other fractures are evident. No dislocation. No blastic or lytic bone lesions. Temporomandibular joints appear symmetric. Orbits: Orbits appear symmetric bilaterally. No intraorbital lesion evident. Sinuses: There is suspected hemorrhage in the anterior ethmoid air cell regions. There is a retention cyst in the right sphenoid sinus measuring 2.5 x 1.8 cm. Other paranasal sinuses are clear. No bony destruction or expansion. No air-fluid levels. Ostiomeatal complexes are patent bilaterally. There is edema in each inferior nasal turbinate with inferior nares obstruction bilaterally. Small concha bullosa on each side, an anatomic variant. Leftward deviation of anterior nasal septum noted. Soft tissues: Soft tissue swelling present over nasal and mid face regions. No well-defined facial hematoma. No abscess. Salivary glands appear symmetric bilaterally. No adenopathy evident. Tongue and tongue base regions appear normal. Visualized pharynx appears normal. CT CERVICAL SPINE FINDINGS Alignment: There is no appreciable spondylolisthesis. Skull base and vertebrae: Skull base and craniocervical junction regions appear normal. No evident fracture. No blastic or lytic bone lesions. Soft tissues and spinal canal: Nuchal ligament calcification is present posterior to C4 and C5. prevertebral soft tissues and predental space regions are normal. There is no evident cord or canal hematoma. There is no paraspinous lesion. Disc levels: There is moderate disc space narrowing at C3-4, C4-5, C5-6, and C6-7. There are anterior osteophytes at C3, C4, C5, and C6. There is facet hypertrophy at multiple levels bilaterally. There is mild exit foraminal narrowing due to bony hypertrophy at C3-4 on the left, at C4-5 on the right, at C5-6 bilaterally, and at C6-7 bilaterally. No disc extrusion or stenosis is appreciable.  Uncovertebral spurring is noted at C3, C4, C5, C6, and C7 bilaterally. Upper chest: Visualized upper lung regions are clear. Other: None IMPRESSION: CT head: Small medial right frontal scalp hematoma without underlying fracture. Brain parenchyma appears normal. No mass or hemorrhage. CT maxillofacial: 1. Comminuted anterior nasal fractures. Subtle fracture of the anterior vomer. No other fractures. No dislocations. 2. Probable hemorrhage in the anterior ethmoid regions. Prominent retention cyst in right sphenoid sinus. Edema of the inferior nasal turbinates with obstruction of the inferior nasal cavity medially on each side. Ostiomeatal unit   complexes are patent bilaterally. 3.  No intraorbital lesions. 4. Mid face and paranasal soft tissue swelling. No well-defined hematoma. CT cervical spine: No fracture or spondylolisthesis. Osteoarthritic change at multiple levels. No disc extrusion or stenosis. Electronically Signed   By: William  Woodruff III M.D.   On: 12/10/2019 15:32   DG Pelvis Portable  Result Date: 12/10/2019 CLINICAL DATA:  Pain following fall from ladder EXAM: PORTABLE PELVIS 1-2 VIEWS COMPARISON:  None. FINDINGS: There is no evidence of pelvic fracture or dislocation. There is mild symmetric narrowing of each hip joint. No erosive change. IMPRESSION: No fracture or dislocation. Mild symmetric narrowing each hip joint. Electronically Signed   By: William  Woodruff III M.D.   On: 12/10/2019 14:51   DG Chest Port 1 View  Result Date: 12/10/2019 CLINICAL DATA:  Pain following fall from ladder EXAM: PORTABLE CHEST 1 VIEW COMPARISON:  Jun 05, 2019. FINDINGS: Lungs are clear. Heart size and pulmonary vascularity are normal. No adenopathy. No pneumothorax. There is degenerative change in the thoracic spine. IMPRESSION: Lungs clear.  Cardiac silhouette normal.  No pneumothorax. Electronically Signed   By: William  Woodruff III M.D.   On: 12/10/2019 14:51   DG Knee Left Port  Result Date:  12/10/2019 CLINICAL DATA:  Pain following fall EXAM: PORTABLE LEFT KNEE - 1-2 VIEW COMPARISON:  None. FINDINGS: Frontal and lateral views were obtained. There is a comminuted fracture of the patella with displacement of major fracture fragments by as much as 1.5 cm. No other fracture. No dislocation. No appreciable joint space narrowing. Soft tissue swelling noted anteriorly to the comminuted patellar fracture. IMPRESSION: Comminuted patellar fracture with fracture fragment distraction in the midportion. Less distraction along the more inferior fracture of the patella. No other fractures. No dislocation. No appreciable joint space narrowing. Electronically Signed   By: William  Woodruff III M.D.   On: 12/10/2019 14:53   CT MAXILLOFACIAL WO CONTRAST  Result Date: 12/10/2019 CLINICAL DATA:  Fall from ladder EXAM: CT HEAD WITHOUT CONTRAST CT MAXILLOFACIAL WITHOUT CONTRAST CT CERVICAL SPINE WITHOUT CONTRAST TECHNIQUE: Multidetector CT imaging of the head, cervical spine, and maxillofacial structures were performed using the standard protocol without intravenous contrast. Multiplanar CT image reconstructions of the cervical spine and maxillofacial structures were also generated. COMPARISON:  None. FINDINGS: CT HEAD FINDINGS Brain: Ventricles and sulci are normal in size and configuration. There is no intracranial mass, hemorrhage, extra-axial fluid collection, or midline shift. Brain parenchyma appears unremarkable. There is no demonstrable acute infarct. Vascular: No hyperdense vessels.  No evident vascular calcification. Skull: Bony calvarium appears intact. There is a small medial right frontal scalp hematoma. Other: Mastoid air cells are clear. CT MAXILLOFACIAL FINDINGS Osseous: There is a comminuted fracture along the distal nasion as well fractures of the anterior right and left nasal bones. There is a minimally displaced fracture of the anterior vomer. No other fractures are evident. No dislocation. No  blastic or lytic bone lesions. Temporomandibular joints appear symmetric. Orbits: Orbits appear symmetric bilaterally. No intraorbital lesion evident. Sinuses: There is suspected hemorrhage in the anterior ethmoid air cell regions. There is a retention cyst in the right sphenoid sinus measuring 2.5 x 1.8 cm. Other paranasal sinuses are clear. No bony destruction or expansion. No air-fluid levels. Ostiomeatal complexes are patent bilaterally. There is edema in each inferior nasal turbinate with inferior nares obstruction bilaterally. Small concha bullosa on each side, an anatomic variant. Leftward deviation of anterior nasal septum noted. Soft tissues: Soft tissue swelling present over nasal and mid   face regions. No well-defined facial hematoma. No abscess. Salivary glands appear symmetric bilaterally. No adenopathy evident. Tongue and tongue base regions appear normal. Visualized pharynx appears normal. CT CERVICAL SPINE FINDINGS Alignment: There is no appreciable spondylolisthesis. Skull base and vertebrae: Skull base and craniocervical junction regions appear normal. No evident fracture. No blastic or lytic bone lesions. Soft tissues and spinal canal: Nuchal ligament calcification is present posterior to C4 and C5. prevertebral soft tissues and predental space regions are normal. There is no evident cord or canal hematoma. There is no paraspinous lesion. Disc levels: There is moderate disc space narrowing at C3-4, C4-5, C5-6, and C6-7. There are anterior osteophytes at C3, C4, C5, and C6. There is facet hypertrophy at multiple levels bilaterally. There is mild exit foraminal narrowing due to bony hypertrophy at C3-4 on the left, at C4-5 on the right, at C5-6 bilaterally, and at C6-7 bilaterally. No disc extrusion or stenosis is appreciable. Uncovertebral spurring is noted at C3, C4, C5, C6, and C7 bilaterally. Upper chest: Visualized upper lung regions are clear. Other: None IMPRESSION: CT head: Small medial right  frontal scalp hematoma without underlying fracture. Brain parenchyma appears normal. No mass or hemorrhage. CT maxillofacial: 1. Comminuted anterior nasal fractures. Subtle fracture of the anterior vomer. No other fractures. No dislocations. 2. Probable hemorrhage in the anterior ethmoid regions. Prominent retention cyst in right sphenoid sinus. Edema of the inferior nasal turbinates with obstruction of the inferior nasal cavity medially on each side. Ostiomeatal unit complexes are patent bilaterally. 3.  No intraorbital lesions. 4. Mid face and paranasal soft tissue swelling. No well-defined hematoma. CT cervical spine: No fracture or spondylolisthesis. Osteoarthritic change at multiple levels. No disc extrusion or stenosis. Electronically Signed   By: William  Woodruff III M.D.   On: 12/10/2019 15:32    Review of Systems  HENT: Positive for facial swelling. Negative for ear discharge, ear pain, hearing loss and tinnitus.   Eyes: Negative for photophobia and pain.  Respiratory: Negative for cough and shortness of breath.   Cardiovascular: Negative for chest pain.  Gastrointestinal: Negative for abdominal pain, nausea and vomiting.  Genitourinary: Negative for dysuria, flank pain, frequency and urgency.  Musculoskeletal: Positive for arthralgias (Left knee). Negative for back pain, myalgias and neck pain.  Neurological: Negative for dizziness and headaches.  Hematological: Does not bruise/bleed easily.  Psychiatric/Behavioral: The patient is not nervous/anxious.    Blood pressure (!) 146/91, pulse 73, temperature 97.6 F (36.4 C), temperature source Oral, resp. rate 14, height 5' 10" (1.778 m), weight 84.4 kg, SpO2 96 %. Physical Exam Constitutional:      General: He is not in acute distress.    Appearance: He is well-developed. He is not diaphoretic.  HENT:     Head: Normocephalic.  Eyes:     General: No scleral icterus.       Right eye: No discharge.        Left eye: No discharge.      Conjunctiva/sclera: Conjunctivae normal.  Neck:     Comments: C-collar Cardiovascular:     Rate and Rhythm: Normal rate and regular rhythm.  Pulmonary:     Effort: Pulmonary effort is normal. No respiratory distress.  Musculoskeletal:     Comments: LLE No traumatic wounds, ecchymosis, or rash  KI in place, mod TTP  Mod knee effusion  Sens DPN, SPN, TN intact  Motor EHL, ext, flex, evers 5/5  DP 2+, PT 1+, No significant edema  Skin:    General: Skin is   warm and dry.  Neurological:     Mental Status: He is alert.  Psychiatric:        Behavior: Behavior normal.     Assessment/Plan: Left patella fx -- Will need ORIF. If ENT recommends surgery for facial fxs then would recommend trauma admit and ortho and ENT can try to coordinate surgical times tomorrow. If so then please make NPO after MN. If not then can discharge home in [place], WBAT and f/u with Dr. Handy or Haddix in office next week to discuss surgery. Hypothyroidism HLD    Jihaad Bruschi J. Derek Huneycutt, PA-C Orthopedic Surgery [phone] 12/10/2019, 3:51 PM  

## 2019-12-10 NOTE — ED Triage Notes (Signed)
Pt BIB [facility] from home. Pt was working outside and fell off a ladder approx. 7 ft onto cement. Pt landed on face. Visible laceration above left eye. Pt A&Ox4 on arrival. Visible deformity to left knee. VSS.

## 2019-12-11 ENCOUNTER — Encounter (HOSPITAL_COMMUNITY)
Admission: EM | Disposition: A | Discharge status: Home Health | Payer: Self-pay | Source: Home / Self Care | Attending: Student

## 2019-12-11 ENCOUNTER — Observation Stay (HOSPITAL_COMMUNITY): Payer: Medicare HMO | Admitting: Anesthesiology

## 2019-12-11 ENCOUNTER — Observation Stay (HOSPITAL_COMMUNITY): Payer: Medicare HMO

## 2019-12-11 DIAGNOSIS — S82002A Unspecified fracture of left patella, initial encounter for closed fracture: Secondary | ICD-10-CM | POA: Diagnosis not present

## 2019-12-11 DIAGNOSIS — I700 Atherosclerosis of aorta: Secondary | ICD-10-CM | POA: Diagnosis not present

## 2019-12-11 DIAGNOSIS — E785 Hyperlipidemia, unspecified: Secondary | ICD-10-CM | POA: Diagnosis not present

## 2019-12-11 DIAGNOSIS — G8918 Other acute postprocedural pain: Secondary | ICD-10-CM | POA: Diagnosis not present

## 2019-12-11 DIAGNOSIS — Z79890 Hormone replacement therapy (postmenopausal): Secondary | ICD-10-CM | POA: Diagnosis not present

## 2019-12-11 DIAGNOSIS — E039 Hypothyroidism, unspecified: Secondary | ICD-10-CM | POA: Diagnosis not present

## 2019-12-11 DIAGNOSIS — D62 Acute posthemorrhagic anemia: Secondary | ICD-10-CM | POA: Diagnosis not present

## 2019-12-11 DIAGNOSIS — Z7982 Long term (current) use of aspirin: Secondary | ICD-10-CM | POA: Diagnosis not present

## 2019-12-11 DIAGNOSIS — Z20822 Contact with and (suspected) exposure to covid-19: Secondary | ICD-10-CM | POA: Diagnosis not present

## 2019-12-11 DIAGNOSIS — Z23 Encounter for immunization: Secondary | ICD-10-CM | POA: Diagnosis not present

## 2019-12-11 DIAGNOSIS — Z79899 Other long term (current) drug therapy: Secondary | ICD-10-CM | POA: Diagnosis not present

## 2019-12-11 DIAGNOSIS — S022XXA Fracture of nasal bones, initial encounter for closed fracture: Secondary | ICD-10-CM | POA: Diagnosis not present

## 2019-12-11 DIAGNOSIS — S82045A Nondisplaced comminuted fracture of left patella, initial encounter for closed fracture: Secondary | ICD-10-CM | POA: Diagnosis not present

## 2019-12-11 DIAGNOSIS — S82042A Displaced comminuted fracture of left patella, initial encounter for closed fracture: Secondary | ICD-10-CM | POA: Diagnosis not present

## 2019-12-11 DIAGNOSIS — W11XXXA Fall on and from ladder, initial encounter: Secondary | ICD-10-CM | POA: Diagnosis not present

## 2019-12-11 DIAGNOSIS — E890 Postprocedural hypothyroidism: Secondary | ICD-10-CM | POA: Diagnosis not present

## 2019-12-11 DIAGNOSIS — Z0389 Encounter for observation for other suspected diseases and conditions ruled out: Secondary | ICD-10-CM | POA: Diagnosis not present

## 2019-12-11 HISTORY — PX: ORIF PATELLA: SHX5033

## 2019-12-11 LAB — SURGICAL PCR SCREEN
MRSA, PCR: NEGATIVE
Staphylococcus aureus: NEGATIVE

## 2019-12-11 LAB — HIV ANTIBODY (ROUTINE TESTING W REFLEX): HIV Screen 4th Generation wRfx: NONREACTIVE

## 2019-12-11 SURGERY — OPEN REDUCTION INTERNAL FIXATION (ORIF) PATELLA
Anesthesia: Regional | Site: Knee | Laterality: Left

## 2019-12-11 MED ORDER — PROPOFOL 10 MG/ML IV BOLUS
INTRAVENOUS | Status: DC | PRN
  Administered 2019-12-11: 150 mg via INTRAVENOUS

## 2019-12-11 MED ORDER — LIDOCAINE HCL (PF) 2 % IJ SOLN
INTRAMUSCULAR | Status: AC
  Filled 2019-12-11: qty 5

## 2019-12-11 MED ORDER — 0.9 % SODIUM CHLORIDE (POUR BTL) OPTIME
TOPICAL | Status: DC | PRN
  Administered 2019-12-11: 1000 mL

## 2019-12-11 MED ORDER — MIDAZOLAM HCL 2 MG/2ML IJ SOLN
INTRAMUSCULAR | Status: DC | PRN
  Administered 2019-12-11: 2 mg via INTRAVENOUS

## 2019-12-11 MED ORDER — ATORVASTATIN CALCIUM 10 MG PO TABS
20.0000 mg | ORAL_TABLET | Freq: Every day | ORAL | Status: DC
  Administered 2019-12-11 – 2019-12-12 (×2): 20 mg via ORAL
  Filled 2019-12-11 (×2): qty 2

## 2019-12-11 MED ORDER — ROPIVACAINE HCL 5 MG/ML IJ SOLN
INTRAMUSCULAR | Status: DC | PRN
  Administered 2019-12-11: 30 mL via PERINEURAL

## 2019-12-11 MED ORDER — ASPIRIN EC 325 MG PO TBEC: 325.0000 mg | DELAYED_RELEASE_TABLET | Freq: Two times a day (BID) | ORAL | 0 refills | Status: AC

## 2019-12-11 MED ORDER — HYDROMORPHONE HCL 1 MG/ML IJ SOLN
0.2500 mg | INTRAMUSCULAR | Status: DC | PRN
  Administered 2019-12-11 (×2): 0.5 mg via INTRAVENOUS

## 2019-12-11 MED ORDER — CEFAZOLIN SODIUM-DEXTROSE 2-4 GM/100ML-% IV SOLN
2.0000 g | Freq: Three times a day (TID) | INTRAVENOUS | Status: AC
  Administered 2019-12-11 – 2019-12-12 (×3): 2 g via INTRAVENOUS
  Filled 2019-12-11 (×3): qty 100

## 2019-12-11 MED ORDER — VANCOMYCIN HCL 1000 MG IV SOLR
INTRAVENOUS | Status: AC
  Filled 2019-12-11: qty 1000

## 2019-12-11 MED ORDER — OXYCODONE HCL 5 MG PO TABS: 5.0000 mg | ORAL_TABLET | Freq: Once | ORAL | Status: DC | PRN

## 2019-12-11 MED ORDER — ONDANSETRON HCL 4 MG/2ML IJ SOLN
INTRAMUSCULAR | Status: AC
  Filled 2019-12-11: qty 2

## 2019-12-11 MED ORDER — LIDOCAINE 2% (20 MG/ML) 5 ML SYRINGE
INTRAMUSCULAR | Status: DC | PRN
  Administered 2019-12-11: 40 mg via INTRAVENOUS

## 2019-12-11 MED ORDER — FENTANYL CITRATE (PF) 250 MCG/5ML IJ SOLN
INTRAMUSCULAR | Status: AC
  Filled 2019-12-11: qty 5

## 2019-12-11 MED ORDER — DEXAMETHASONE SODIUM PHOSPHATE 10 MG/ML IJ SOLN
INTRAMUSCULAR | Status: AC
  Filled 2019-12-11: qty 1

## 2019-12-11 MED ORDER — ACETAMINOPHEN 10 MG/ML IV SOLN
INTRAVENOUS | Status: AC
  Filled 2019-12-11: qty 100

## 2019-12-11 MED ORDER — FENTANYL CITRATE (PF) 250 MCG/5ML IJ SOLN
INTRAMUSCULAR | Status: DC | PRN
  Administered 2019-12-11: 100 ug via INTRAVENOUS

## 2019-12-11 MED ORDER — ACETAMINOPHEN 10 MG/ML IV SOLN
1000.0000 mg | Freq: Once | INTRAVENOUS | Status: DC | PRN
  Administered 2019-12-11: 1000 mg via INTRAVENOUS

## 2019-12-11 MED ORDER — OXYCODONE-ACETAMINOPHEN 5-325 MG PO TABS
1.0000 | ORAL_TABLET | ORAL | 0 refills | Status: DC | PRN
Start: 2019-12-11 — End: 2020-06-14

## 2019-12-11 MED ORDER — MIDAZOLAM HCL 2 MG/2ML IJ SOLN
INTRAMUSCULAR | Status: AC
  Filled 2019-12-11: qty 2

## 2019-12-11 MED ORDER — ASPIRIN 325 MG PO TABS
325.0000 mg | ORAL_TABLET | Freq: Two times a day (BID) | ORAL | Status: DC
  Administered 2019-12-12 – 2019-12-13 (×3): 325 mg via ORAL
  Filled 2019-12-11 (×3): qty 1

## 2019-12-11 MED ORDER — DEXAMETHASONE SODIUM PHOSPHATE 10 MG/ML IJ SOLN
INTRAMUSCULAR | Status: DC | PRN
  Administered 2019-12-11: 10 mg via INTRAVENOUS

## 2019-12-11 MED ORDER — METHOCARBAMOL 500 MG PO TABS: 500.0000 mg | ORAL_TABLET | Freq: Four times a day (QID) | ORAL | 0 refills | Status: DC | PRN

## 2019-12-11 MED ORDER — LACTATED RINGERS IV SOLN: INTRAVENOUS | Status: DC | PRN

## 2019-12-11 MED ORDER — OXYCODONE HCL 5 MG/5ML PO SOLN: 5.0000 mg | Freq: Once | ORAL | Status: DC | PRN

## 2019-12-11 MED ORDER — PROPOFOL 10 MG/ML IV BOLUS
INTRAVENOUS | Status: AC
  Filled 2019-12-11: qty 20

## 2019-12-11 MED ORDER — VANCOMYCIN HCL 1000 MG IV SOLR
INTRAVENOUS | Status: DC | PRN
  Administered 2019-12-11: 1000 mg via TOPICAL

## 2019-12-11 MED ORDER — ONDANSETRON HCL 4 MG PO TABS
4.0000 mg | ORAL_TABLET | Freq: Four times a day (QID) | ORAL | 0 refills | Status: DC | PRN
Start: 2019-12-11 — End: 2020-04-19

## 2019-12-11 MED ORDER — LEVOTHYROXINE SODIUM 100 MCG PO TABS
100.0000 ug | ORAL_TABLET | Freq: Every day | ORAL | Status: DC
  Administered 2019-12-12 – 2019-12-13 (×2): 100 ug via ORAL
  Filled 2019-12-11 (×2): qty 1

## 2019-12-11 MED ORDER — ONDANSETRON HCL 4 MG/2ML IJ SOLN: 4.0000 mg | Freq: Once | INTRAMUSCULAR | Status: DC | PRN

## 2019-12-11 MED ORDER — HYDROMORPHONE HCL 1 MG/ML IJ SOLN
INTRAMUSCULAR | Status: AC
  Filled 2019-12-11: qty 1

## 2019-12-11 SURGICAL SUPPLY — 78 items
BANDAGE ESMARK 6X9 LF (GAUZE/BANDAGES/DRESSINGS) IMPLANT
BIT DRILL QC 2X140 (BIT) ×1 IMPLANT
BLADE CLIPPER SURG (BLADE) ×2 IMPLANT
BNDG COHESIVE 4X5 TAN STRL (GAUZE/BANDAGES/DRESSINGS) ×1 IMPLANT
BNDG ELASTIC 4X5.8 VLCR STR LF (GAUZE/BANDAGES/DRESSINGS) ×2 IMPLANT
BNDG ELASTIC 6X10 VLCR STRL LF (GAUZE/BANDAGES/DRESSINGS) ×1 IMPLANT
BNDG ELASTIC 6X5.8 VLCR STR LF (GAUZE/BANDAGES/DRESSINGS) ×2 IMPLANT
BNDG ESMARK 6X9 LF (GAUZE/BANDAGES/DRESSINGS) ×2
CHLORAPREP W/TINT 26 (MISCELLANEOUS) ×2 IMPLANT
CLSR STERI-STRIP ANTIMIC 1/2X4 (GAUZE/BANDAGES/DRESSINGS) ×1 IMPLANT
COVER SURGICAL LIGHT HANDLE (MISCELLANEOUS) ×2 IMPLANT
COVER WAND RF STERILE (DRAPES) ×1 IMPLANT
CUFF TOURN SGL QUICK 34 (TOURNIQUET CUFF) ×1
CUFF TOURN SGL QUICK 42 (TOURNIQUET CUFF) IMPLANT
CUFF TRNQT CYL 34X4.125X (TOURNIQUET CUFF) ×1 IMPLANT
DECANTER SPIKE VIAL GLASS SM (MISCELLANEOUS) IMPLANT
DERMABOND ADVANCED (GAUZE/BANDAGES/DRESSINGS) ×1
DERMABOND ADVANCED .7 DNX12 (GAUZE/BANDAGES/DRESSINGS) ×1 IMPLANT
DRAPE C-ARM 42X72 X-RAY (DRAPES) ×2 IMPLANT
DRAPE C-ARMOR (DRAPES) ×2 IMPLANT
DRAPE HALF SHEET 40X57 (DRAPES) ×3 IMPLANT
DRAPE IMP U-DRAPE 54X76 (DRAPES) ×2 IMPLANT
DRAPE ORTHO SPLIT 77X108 STRL (DRAPES) ×2
DRAPE SURG ORHT 6 SPLT 77X108 (DRAPES) ×2 IMPLANT
DRAPE U-SHAPE 47X51 STRL (DRAPES) ×2 IMPLANT
ELECT REM PT RETURN 9FT ADLT (ELECTROSURGICAL) ×2
ELECTRODE REM PT RTRN 9FT ADLT (ELECTROSURGICAL) ×1 IMPLANT
GAUZE SPONGE 4X4 12PLY STRL LF (GAUZE/BANDAGES/DRESSINGS) ×1 IMPLANT
GLOVE BIO SURGEON STRL SZ 6.5 (GLOVE) ×6 IMPLANT
GLOVE BIO SURGEON STRL SZ7.5 (GLOVE) ×8 IMPLANT
GLOVE BIOGEL PI IND STRL 6.5 (GLOVE) ×1 IMPLANT
GLOVE BIOGEL PI IND STRL 7.5 (GLOVE) ×1 IMPLANT
GLOVE BIOGEL PI INDICATOR 6.5 (GLOVE) ×1
GLOVE BIOGEL PI INDICATOR 7.5 (GLOVE) ×1
GOWN STRL REUS W/ TWL LRG LVL3 (GOWN DISPOSABLE) ×2 IMPLANT
GOWN STRL REUS W/TWL LRG LVL3 (GOWN DISPOSABLE) ×2
IMMOBILIZER KNEE 22 UNIV (SOFTGOODS) ×2 IMPLANT
K-WIRE 1.25 TRCR POINT 150 (WIRE) ×2
KIT BASIN OR (CUSTOM PROCEDURE TRAY) ×2 IMPLANT
KIT TEMPLATE PATELLA ANTERIOR (ORTHOPEDIC DISPOSABLE SUPPLIES) ×1 IMPLANT
KIT TURNOVER KIT B (KITS) ×2 IMPLANT
KWIRE 1.25 TRCR POINT 150 (WIRE) IMPLANT
MANIFOLD NEPTUNE II (INSTRUMENTS) ×2 IMPLANT
NEEDLE 22X1 1/2 (OR ONLY) (NEEDLE) IMPLANT
NS IRRIG 1000ML POUR BTL (IV SOLUTION) ×2 IMPLANT
PACK GENERAL/GYN (CUSTOM PROCEDURE TRAY) ×2 IMPLANT
PAD ABD 8X10 STRL (GAUZE/BANDAGES/DRESSINGS) ×1 IMPLANT
PAD ARMBOARD 7.5X6 YLW CONV (MISCELLANEOUS) ×4 IMPLANT
PADDING CAST COTTON 6X4 STRL (CAST SUPPLIES) ×1 IMPLANT
PLATE LOCK SM 3H ANT PAT 2.7 S (Plate) ×1 IMPLANT
RETRIEVER SUT HEWSON (MISCELLANEOUS) IMPLANT
SCREW LOCK T8 22X2.7XST VA (Screw) IMPLANT
SCREW LOCK VA ST 2.7X12 (Screw) ×2 IMPLANT
SCREW LOCK VA ST 2.7X14 (Screw) ×1 IMPLANT
SCREW LOCK VA ST 2.7X18 (Screw) ×2 IMPLANT
SCREW LOCK VA ST 2.7X20 (Screw) ×1 IMPLANT
SCREW LOCKING 2.7X16MM VA (Screw) ×4 IMPLANT
SCREW LOCKING 2.7X22MM (Screw) ×1 IMPLANT
SCREW METAPHYSEAL 2.7X20MM (Screw) ×1 IMPLANT
STOCKINETTE IMPERVIOUS LG (DRAPES) ×1 IMPLANT
SUCTION FRAZIER HANDLE 10FR (MISCELLANEOUS) ×1
SUCTION TUBE FRAZIER 10FR DISP (MISCELLANEOUS) IMPLANT
SUT FIBERWIRE #2 38 T-5 BLUE (SUTURE)
SUT FIBERWIRE #5 38 CONV NDL (SUTURE) ×2
SUT MNCRL AB 3-0 PS2 18 (SUTURE) ×1 IMPLANT
SUT VIC AB 0 CT1 27 (SUTURE) ×1
SUT VIC AB 0 CT1 27XBRD ANBCTR (SUTURE) ×1 IMPLANT
SUT VIC AB 1 CT1 27 (SUTURE) ×1
SUT VIC AB 1 CT1 27XBRD ANBCTR (SUTURE) ×1 IMPLANT
SUT VIC AB 1 CT1 36 (SUTURE) ×1 IMPLANT
SUT VIC AB 2-0 CT1 27 (SUTURE) ×1
SUT VIC AB 2-0 CT1 TAPERPNT 27 (SUTURE) ×1 IMPLANT
SUTURE FIBERWR #2 38 T-5 BLUE (SUTURE) IMPLANT
SUTURE FIBERWR #5 38 CONV NDL (SUTURE) IMPLANT
SYR CONTROL 10ML LL (SYRINGE) IMPLANT
TOWEL GREEN STERILE (TOWEL DISPOSABLE) ×2 IMPLANT
UNDERPAD 30X36 HEAVY ABSORB (UNDERPADS AND DIAPERS) ×2 IMPLANT
WATER STERILE IRR 1000ML POUR (IV SOLUTION) ×1 IMPLANT

## 2019-12-11 NOTE — Discharge Instructions (Signed)
[facility] Discharge Instructions   General Discharge Instructions  WEIGHT BEARING STATUS: Weightbearing as tolerated in hinged knee brace  RANGE OF MOTION/ACTIVITY: Keep hinge brace locked in full extension at all times  Wound Care: you may remove your surgical dressing on post-op day #2 (Sunday 12/13/19). Leave white steri-strips in place. Incisions can be left open to air if there is no drainage. If incision continues to have drainage, follow wound care instructions below. Okay to shower if no drainage from incisions.  DVT/PE prophylaxis: Aspirin 325 mg twice daily x 30 days  Diet: as you were eating previously.  Can use over the counter stool softeners and bowel preparations, such as Miralax, to help with bowel movements.  Narcotics can be constipating.  Be sure to drink plenty of fluids  PAIN MEDICATION USE AND EXPECTATIONS  You have likely been given narcotic medications to help control your pain.  After a traumatic event that results in an fracture (broken bone) with or without surgery, it is ok to use narcotic pain medications to help control one's pain.  We understand that everyone responds to pain differently and each individual patient will be evaluated on a regular basis for the continued need for narcotic medications. Ideally, narcotic medication use should last no more than 6-8 weeks (coinciding with fracture healing).   As a patient it is your responsibility as well to monitor narcotic medication use and report the amount and frequency you use these medications when you come to your office visit.   We would also advise that if you are using narcotic medications, you should take a dose prior to therapy to maximize you participation.  IF YOU ARE ON NARCOTIC MEDICATIONS IT IS NOT PERMISSIBLE TO OPERATE A MOTOR VEHICLE (MOTORCYCLE/CAR/TRUCK/MOPED) OR HEAVY MACHINERY DO NOT MIX NARCOTICS WITH OTHER CNS (CENTRAL NERVOUS SYSTEM) DEPRESSANTS SUCH AS ALCOHOL   STOP  SMOKING OR USING NICOTINE PRODUCTS!!!!  As discussed nicotine severely impairs your body's ability to heal surgical and traumatic wounds but also impairs bone healing.  Wounds and bone heal by forming microscopic blood vessels (angiogenesis) and nicotine is a vasoconstrictor (essentially, shrinks blood vessels).  Therefore, if vasoconstriction occurs to these microscopic blood vessels they essentially disappear and are unable to deliver necessary nutrients to the healing tissue.  This is one modifiable factor that you can do to dramatically increase your chances of healing your injury.    (This means no smoking, no nicotine gum, patches, etc)  DO NOT USE NONSTEROIDAL ANTI-INFLAMMATORY DRUGS (NSAID'S)  Using products such as Advil (ibuprofen), Aleve (naproxen), Motrin (ibuprofen) for additional pain control during fracture healing can delay and/or prevent the healing response.  If you would like to take over the counter (OTC) medication, Tylenol (acetaminophen) is ok.  However, some narcotic medications that are given for pain control contain acetaminophen as well. Therefore, you should not exceed more than 4000 mg of tylenol in a day if you do not have liver disease.  Also note that there are may OTC medicines, such as cold medicines and allergy medicines that my contain tylenol as well.  If you have any questions about medications and/or interactions please ask your doctor/PA or your pharmacist.      ICE AND ELEVATE INJURED/OPERATIVE EXTREMITY  Using ice and elevating the injured extremity above your heart can help with swelling and pain control.  Icing in a pulsatile fashion, such as 20 minutes on and 20 minutes off, can be followed.    Do not place ice  directly on skin. Make sure there is a barrier between to skin and the ice pack.    Using frozen items such as frozen peas works well as the conform nicely to the are that needs to be iced.  USE AN ACE WRAP OR TED HOSE FOR SWELLING CONTROL  In  addition to icing and elevation, Ace wraps or TED hose are used to help limit and resolve swelling.  It is recommended to use Ace wraps or TED hose until you are informed to stop.    When using Ace Wraps start the wrapping distally (farthest away from the body) and wrap proximally (closer to the body)   Example: If you had surgery on your leg or thing and you do not have a splint on, start the ace wrap at the toes and work your way up to the thigh        If you had surgery on your upper extremity and do not have a splint on, start the ace wrap at your fingers and work your way up to the upper arm   CALL THE OFFICE WITH ANY QUESTIONS OR CONCERNS: [phone]   VISIT OUR WEBSITE FOR ADDITIONAL INFORMATION: [email or web address]    Discharge Wound Care Instructions  Do NOT apply any ointments, solutions or lotions to pin sites or surgical wounds.  These prevent needed drainage and even though solutions like hydrogen peroxide kill bacteria, they also damage cells lining the pin sites that help fight infection.  Applying lotions or ointments can keep the wounds moist and can cause them to breakdown and open up as well. This can increase the risk for infection. When in doubt call the office.  Surgical incisions should be dressed daily.  If any drainage is noted, use one layer of adaptic, then gauze, Kerlix, and an ace wrap.  Once the incision is completely dry and without drainage, it may be left open to air out.  Showering may begin 36-48 hours later.  Cleaning gently with soap and water.  Traumatic wounds should be dressed daily as well.    One layer of adaptic, gauze, Kerlix, then ace wrap.  The adaptic can be discontinued once the draining has ceased    If you have a wet to dry dressing: wet the gauze with saline the squeeze as much saline out so the gauze is moist (not soaking wet), place moistened gauze over wound, then place a dry gauze over the moist one, followed by Kerlix wrap, then ace  wrap.

## 2019-12-11 NOTE — TOC CAGE-AID Note (Signed)
Transition of Care [facility]) - CAGE-AID Screening   Patient Details  Name: Robert [name] MRN: [id number] Date of Birth: 02-28-1955  Transition of Care [facility]) CM/SW Contact:    Emeterio Reeve, [place] Phone Number: 12/11/2019, 3:30 PM   Clinical Narrative:  CSW met with pt at bedside. CSW introduced self and explained role at the hospital.  Pt denies alcohol use. Pt denies substance use. Pt did not need any resources at this time.    CAGE-AID Screening:    Have You Ever Felt You Ought to Cut Down on Your Drinking or Drug Use?: No Have People Annoyed You By Critizing Your Drinking Or Drug Use?: No Have You Felt Bad Or Guilty About Your Drinking Or Drug Use?: No Have You Ever Had a Drink or Used Drugs First Thing In The Morning to Steady Your Nerves or to Get Rid of a Hangover?: No CAGE-AID Score: 0  Substance Abuse Education Offered: Yes      Blima Ledger, [place] Social Worker [phone]

## 2019-12-11 NOTE — Anesthesia Procedure Notes (Signed)
Procedure Name: LMA Insertion Date/Time: 12/11/2019 7:45 AM Performed by: Shary Decamp, CRNA Pre-anesthesia Checklist: Patient identified, Patient being monitored, Timeout performed, Emergency Drugs available and Suction available Patient Re-evaluated:Patient Re-evaluated prior to induction Oxygen Delivery Method: Circle System Utilized Preoxygenation: Pre-oxygenation with 100% oxygen Induction Type: IV induction Ventilation: Mask ventilation without difficulty LMA: LMA inserted LMA Size: 5.0 Number of attempts: 1 Airway Equipment and Method: Stylet Placement Confirmation: positive ETCO2 and breath sounds checked- equal and bilateral Tube secured with: Tape Dental Injury: Teeth and Oropharynx as per pre-operative assessment

## 2019-12-11 NOTE — Transfer of Care (Signed)
Immediate Anesthesia Transfer of Care Note  Patient: Robert [name]  Procedure(s) Performed: OPEN REDUCTION INTERNAL (ORIF) FIXATION PATELLA (Left Knee)  Patient Location: PACU  Anesthesia Type:General  Level of Consciousness: drowsy, patient cooperative and responds to stimulation  Airway & Oxygen Therapy: Patient Spontanous Breathing and Patient connected to nasal cannula oxygen  Post-op Assessment: Report given to RN, Post -op Vital signs reviewed and stable and Patient moving all extremities X 4  Post vital signs: Reviewed and stable  Last Vitals:  Vitals Value Taken Time  BP 125/82 12/11/19 0951  Temp    Pulse 64 12/11/19 0953  Resp 13 12/11/19 0953  SpO2 100 % 12/11/19 0953  Vitals shown include unvalidated device data.  Last Pain:  Vitals:   12/11/19 0328  TempSrc: Oral  PainSc:       Patients Stated Pain Goal: 2 (12/10/19 2218)  Complications: No complications documented.

## 2019-12-11 NOTE — Progress Notes (Signed)
Orthopedic Tech Progress Note Patient Details:  Robert [name] November 04, 1955 [id number] Called in order to [facility] for a HINGED KNEE BRACE Patient ID: Robert [name], male   DOB: 01/05/1956, 64 y.o.   MRN: [id number]   Robert [name] 12/11/2019, 10:51 AM

## 2019-12-11 NOTE — Progress Notes (Signed)
Patient seen and examined. Plan for ORIF of left patella fracture. Risks and benefits discussed. Risks included but not limited to bleeding, infection, nonunion, malunion, knee stiffness, patellofemoral arthritis, nerve and blood vessel injury, hardware irritation, hardware failure, DVT, and anesthetic complications. He agrees to proceed with surgery and consent was obtained.  Roby Lofts, MD [facility] [phone] (office) [email or web address]

## 2019-12-11 NOTE — Anesthesia Procedure Notes (Signed)
Anesthesia Regional Block: Adductor canal block   Pre-Anesthetic Checklist: ,, timeout performed, Correct Patient, Correct Site, Correct Laterality, Correct Procedure, Correct Position, site marked, Risks and benefits discussed,  Surgical consent,  Pre-op evaluation,  At surgeon's request and post-op pain management  Laterality: Left  Prep: Dura Prep       Needles:  Injection technique: Single-shot  Needle Type: Echogenic Stimulator Needle     Needle Length: 9cm  Needle Gauge: 20     Additional Needles:   Procedures:,,,, ultrasound used (permanent image in chart),,,,  Narrative:  Start time: 12/11/2019 7:15 AM End time: 12/11/2019 7:20 AM Injection made incrementally with aspirations every 5 mL.  Performed by: Personally  Anesthesiologist: Atilano Median, DO  Additional Notes: Patient identified. Risks/Benefits/Options discussed with patient including but not limited to bleeding, infection, nerve damage, failed block, incomplete pain control. Patient expressed understanding and wished to proceed. All questions were answered. Sterile technique was used throughout the entire procedure. Please see nursing notes for vital signs. Aspirated in 5cc intervals with injection for negative confirmation. Patient was given instructions on fall risk and not to get out of bed. All questions and concerns addressed with instructions to call with any issues or inadequate analgesia.

## 2019-12-11 NOTE — Op Note (Signed)
Orthopaedic Surgery Operative Note (CSN: [id number] ) Date of Surgery: 12/11/2019  Admit Date: 12/10/2019   Diagnoses: Pre-Op Diagnoses: Left comminuted patella fracture   Post-Op Diagnosis: Same  Procedures: CPT 27524-Open reduction internal fixation of left patella fracture  Surgeons : Primary: Roby Lofts, MD  Assistant: Ulyses Southward, PA-C  Location: OR 3   Anesthesia:General with regional block  Antibiotics: Ancef 2g preop with 1 gm vancomycin powder placed topically   Tourniquet time: Total Tourniquet Time Documented: Thigh (Left) - 57 minutes Total: Thigh (Left) - 57 minutes  Estimated Blood Loss:50 mL  Complications:None   Specimens:None   Implants: Implant Name Type Inv. Item Serial No. Manufacturer Lot No. LRB No. Used Action  2.7 VA LCKNG ANT PATELLA PL SS/3-HOLE Plate   Synthes [id number] Left 1 Implanted  SCREW METAPHYSEAL 2.7X20MM - [id number] Screw SCREW METAPHYSEAL 2.7X20MM  [employer]  Left 1 Implanted  SCREW LOCKING 2.7X12MM - [id number] Screw SCREW LOCKING 2.7X12MM  [employer]  Left 2 Implanted  SCREW LOCKING 2.7X16MM VA - [id number] Screw SCREW LOCKING 2.7X16MM VA  [employer]  Left 4 Implanted  SCREW LOCKING 2.7X18MM - [id number] Screw SCREW LOCKING 2.7X18MM  [employer]  Left 2 Implanted  SCREW LOCKING 2.7X20MM - [id number] Screw SCREW LOCKING 2.7X20MM  [employer]  Left 1 Implanted  SCREW LOCKING 2.7X22MM - [id number] Screw SCREW LOCKING 2.7X22MM  [employer]  Left 1 Implanted  SCREW LOCKING 2.7X14MM - [id number] Screw SCREW LOCKING 2.7X14MM  [employer]  Left 1 Implanted     Indications for Surgery: 64 year old male who fell off the ladder sustaining a comminuted left patella fracture.  Presented as a level 2 trauma.  Due to the significant and unstable nature of his injury as well as his difficulty mobilizing he was admitted and I recommended proceeding with open reduction internal fixation of his  left patella fracture.  Risks and benefits were discussed with the patient.  Risks include but not limited to bleeding, infection, malunion, nonunion, hardware failure, hardware irritation, nerve or blood vessel injury, knee stiffness, posttraumatic arthritis, DVT, even the possibility anesthetic complications.  Patient agreed to proceed with surgery and consent was obtained.  Operative Findings: Open reduction internal fixation of left comminuted patella fracture using Synthes VA 2.13mm patella plate  Procedure: The patient was identified in the preoperative holding area. Consent was confirmed with the patient and their family and all questions were answered. The operative extremity was marked after confirmation with the patient. he was then brought back to the operating room by our anesthesia colleagues.  He was carefully transferred over to a radiolucent flat top table.  He was placed under general anesthetic.  A bump was placed under his operative hip.  A nonsterile tourniquet was placed to his upper thigh.  The left lower extremities were then prepped and draped in usual sterile fashion.  A timeout was performed to verify the patient, the procedure, and extremity preoperative antibiotics were dosed.  Fluoroscopic imaging was obtained to show the unstable nature of his injury.  An anterior approach was carried down through skin and subcutaneous tissue.  The fascia overlying the patella was exposed and the fracture was visualized.  There were a number of free osteochondral fragments.  The smaller ones that were unreconstructable were removed.  There is a 0.5 5.5cm fragment that reduced to the proximal fracture fragment and held it provisionally with 1.25 mm K wires.  I then attempted to reduce the inferior pole of the patella with a large amount  of articular surface on it unfortunately there was some delamination between the anterior cortex and the articular surface which caused difficulty maintaining an  anatomic reduction.  As result I reduced the articular surface to the intact proximal fragment of the patella and was able to palpate underneath the patellofemoral joint.  I then held this provisionally with 1.25 mm K wires.  I then reduced the remainder of the inferior pole of the patella back to the proximal fragment and held it with a reduction tenaculum.  Once I had anatomic reduction of the patella I then confirmed this with fluoroscopy.  I then used a Synthes anterior 2.7 mm VA patellar plate and contoured it to fit the anterior cortex of the patella.  I placed a nonlocking 2.7 millimeter screw to hold the plate in position and then proceeded to place 2.66mm locking screws after in situ contouring was performed.  Due to those free fragments of the joint and the anatomic reduction that I had I felt that leaving the K wires in place would be most appropriate.  I back these out so they were flushed with the superior cortex of the patella and I cut the K wires flush to the cancellous bone.  Fluoroscopic imaging was then obtained and showed that none of the screws had penetrated the joint.  I was able to palpate underneath the patella and no screws had penetrated the articular surface.  The tourniquet was deflated.  The incision was copiously irrigated.  A gram of vancomycin powder were placed into the incision.  I reinforced the patella fixation by doing a figure-of-eight suture through the quad and patellar tendon using a #5 FiberWire.  I tied this in place to reinforce the patella fixation.  I then closed the retinacular tear with #1 Vicryl suture and figure-of-eight fashion.  I then closed the skin with 0 Vicryl, 2-0 Vicryl and 3-0 Monocryl.  Steri-Strips were placed.  A sterile dressing was placed consisting of 4 x 4's, sterile cast padding and a Ace wrap.  The patient was then placed back into a knee immobilizer.  He was taken to the PACU in stable condition.  Post Op Plan/Instructions: Patient will be  weightbearing as tolerated with sleeve locked in extension.  We will place him in a hinged knee brace locked in extension.  He will receive postoperative Ancef.  He will be placed on aspirin for DVT prophylaxis.  We will mobilize him with physical therapy and potentially discharge him home today versus postoperative day 1.  I was present and performed the entire surgery.  Ulyses Southward, PA-C did assist me throughout the case. An assistant was necessary given the difficulty in approach, maintenance of reduction and ability to instrument the fracture.   Truitt Merle, MD [facility]

## 2019-12-11 NOTE — Anesthesia Postprocedure Evaluation (Signed)
Anesthesia Post Note  Patient: Robert [name]  Procedure(s) Performed: OPEN REDUCTION INTERNAL (ORIF) FIXATION PATELLA (Left Knee)     Patient location during evaluation: PACU Anesthesia Type: Regional and General Level of consciousness: awake and alert Pain management: pain level controlled Vital Signs Assessment: post-procedure vital signs reviewed and stable Respiratory status: spontaneous breathing, nonlabored ventilation, respiratory function stable and patient connected to nasal cannula oxygen Cardiovascular status: blood pressure returned to baseline and stable Postop Assessment: no apparent nausea or vomiting Anesthetic complications: no   No complications documented.  Last Vitals:  Vitals:   12/11/19 1030 12/11/19 1056  BP: 139/85 139/90  Pulse:  71  Resp:  17  Temp: 36.4 C 36.7 C  SpO2:  98%    Last Pain:  Vitals:   12/11/19 1056  TempSrc: Oral  PainSc: 3                  Ahsha Hinsley P Ladarien Beeks

## 2019-12-11 NOTE — Interval H&P Note (Signed)
History and Physical Interval Note:  12/11/2019 7:15 AM  Robert [name]  has presented today for surgery, with the diagnosis of Left patella fx.  The various methods of treatment have been discussed with the patient and family. After consideration of risks, benefits and other options for treatment, the patient has consented to  Procedure(s): OPEN REDUCTION INTERNAL (ORIF) FIXATION PATELLA (Left) as a surgical intervention.  The patient's history has been reviewed, patient examined, no change in status, stable for surgery.  I have reviewed the patient's chart and labs.  Questions were answered to the patient's satisfaction.     Caryn Bee P Magon Croson

## 2019-12-11 NOTE — Evaluation (Signed)
Physical Therapy Evaluation Patient Details Name: Genaro Bekker MRN: [id number] DOB: [birth year] Today's Date: 12/11/2019   History of Present Illness  Pt is a 64 y/o male admitted after fall from ladder. Found to have nasal bone fx and L patellar fx. Pt is s/p ORIF of patellar fx. No pertinent PMH.   Clinical Impression  Pt admitted secondary to problem above with deficits below. Pt tolerated mobility very well. Was able to perform gait and stair navigation with min guard A. Pt reports he lives alone, however, states he has neighbors and church family that can assist if he needs help. Recommend DME below. Will continue to follow acutely.     Follow Up Recommendations Home health PT    Equipment Recommendations  Rolling walker with 5" wheels;3in1 (PT)    Recommendations for Other Services       Precautions / Restrictions Precautions Precautions: Fall Precaution Comments: Had fall off of ladder Required Braces or Orthoses: Other Brace Other Brace: bledsoe brace, locked in extension Restrictions Weight Bearing Restrictions: Yes LLE Weight Bearing: Weight bearing as tolerated (n bledsoe brace locked in extension )      Mobility  Bed Mobility Overal bed mobility: Needs Assistance Bed Mobility: Supine to Sit;Sit to Supine     Supine to sit: Min assist Sit to supine: Min assist   General bed mobility comments: Min A for LLE assist. Increased time required.     Transfers Overall transfer level: Needs assistance Equipment used: Rolling walker (2 wheeled) Transfers: Sit to/from Stand Sit to Stand: Min guard         General transfer comment: Min guard for safety. Cues to kick LLE out when sitting and required cues for hand placement.   Ambulation/Gait Ambulation/Gait assistance: Min guard Gait Distance (Feet): 200 Feet Assistive device: Rolling walker (2 wheeled) Gait Pattern/deviations: Step-to pattern;Step-through pattern;Decreased weight shift to left;Antalgic Gait  velocity: Decreased   General Gait Details: Mildly antalgic gait. Cues for sequencing using RW.   Stairs Stairs: Yes Stairs assistance: Min guard Stair Management: Step to pattern;Backwards;Forwards;With walker Number of Stairs: 3 General stair comments: Practiced going up and down steps using RW. Cues for sequencing. Educated how family/friends needed to assist with holding RW. Min guard for safety. Cues for LE sequencing.   Wheelchair Mobility    Modified Rankin (Stroke Patients Only)       Balance Overall balance assessment: Needs assistance Sitting-balance support: No upper extremity supported;Feet supported Sitting balance-Leahy Scale: Good     Standing balance support: Bilateral upper extremity supported;During functional activity Standing balance-Leahy Scale: Poor Standing balance comment: Reliant on BUE support                              Pertinent Vitals/Pain Pain Assessment: 0-10 Pain Score: 7  Pain Location: L knee  Pain Descriptors / Indicators: Aching;Operative site guarding Pain Intervention(s): Monitored during session;Limited activity within patient's tolerance;Repositioned    Home Living Family/patient expects to be discharged to:: Private residence Living Arrangements: Alone Available Help at Discharge: Friend(s);Available PRN/intermittently Type of Home: House Home Access: Stairs to enter Entrance Stairs-Rails: None Entrance Stairs-Number of Steps: 2 Home Layout: One level Home Equipment: None      Prior Function Level of Independence: Independent               Hand Dominance        Extremity/Trunk Assessment   Upper Extremity Assessment Upper Extremity Assessment: Defer to OT evaluation  Lower Extremity Assessment Lower Extremity Assessment: LLE deficits/detail LLE Deficits / Details: LLE in bledsoe brace throughout     Cervical / Trunk Assessment Cervical / Trunk Assessment: Normal  Communication    Communication: No difficulties  Cognition Arousal/Alertness: Awake/alert Behavior During Therapy: WFL for tasks assessed/performed Overall Cognitive Status: Within Functional Limits for tasks assessed                                        General Comments      Exercises     Assessment/Plan    PT Assessment Patient needs continued PT services  PT Problem List Decreased range of motion;Decreased activity tolerance;Decreased knowledge of use of DME;Pain       PT Treatment Interventions Gait training;DME instruction;Therapeutic activities;Functional mobility training;Stair training;Therapeutic exercise;Balance training;Patient/family education    PT Goals (Current goals can be found in the Care Plan section)  Acute Rehab PT Goals Patient Stated Goal: to regain independence PT Goal Formulation: With patient Time For Goal Achievement: 12/25/19 Potential to Achieve Goals: Good    Frequency Min 5X/week   Barriers to discharge        Co-evaluation               AM-PAC PT "6 Clicks" Mobility  Outcome Measure Help needed turning from your back to your side while in a flat bed without using bedrails?: None Help needed moving from lying on your back to sitting on the side of a flat bed without using bedrails?: A Little Help needed moving to and from a bed to a chair (including a wheelchair)?: A Little Help needed standing up from a chair using your arms (e.g., wheelchair or bedside chair)?: A Little Help needed to walk in hospital room?: A Little Help needed climbing 3-5 steps with a railing? : A Little 6 Click Score: 19    End of Session Equipment Utilized During Treatment: Gait belt;Other (comment) (L bledsoe brace) Activity Tolerance: Patient tolerated treatment well Patient left: in bed;with call bell/phone within reach Nurse Communication: Mobility status PT Visit Diagnosis: History of falling (Z91.81);Pain Pain - Right/Left: Left Pain - part of  body: Knee    Time: 2725-3664 PT Time Calculation (min) (ACUTE ONLY): 16 min   Charges:   PT Evaluation $PT Eval Low Complexity: 1 Low          Cindee Salt, DPT  Acute Rehabilitation Services  Pager: [phone] Office: [phone]   Lehman Prom 12/11/2019, 2:54 PM

## 2019-12-11 NOTE — Evaluation (Signed)
Occupational Therapy Evaluation Patient Details Name: Robert [name] MRN: [id number] DOB: May 21, 1955 Today's Date: 12/11/2019    History of Present Illness Pt is a 64 y/o male admitted after fall from ladder. Found to have nasal bone fx and L patellar fx. Pt is s/p ORIF of patellar fx. No pertinent PMH.    Clinical Impression   PTA patient independent. Admitted for above and limited by problem list below, including pain in L LE with decreased ROM locked in extension brace, impaired balance and decreased activity tolerance.  He is mobilizing well considering pain, completing transfers and mobility with min guard using RW, UB ADls seated with setup assist and LB ADLs with up to mod assist.  Reviewed compensatory techniques for LB ADLs and safety, recommendations of DME, and basin bathing only at this time until cleared by MD.  Andee Lineman he will benefit from further OT services while admitted to optimize independence and safety, but anticipate no further need after dc home.     Follow Up Recommendations  No OT follow up;Supervision - Intermittent    Equipment Recommendations  3 in 1 bedside commode    Recommendations for Other Services       Precautions / Restrictions Precautions Precautions: Fall Precaution Comments: Had fall off of ladder Required Braces or Orthoses: Other Brace Other Brace: bledsoe brace, locked in extension Restrictions Weight Bearing Restrictions: Yes LLE Weight Bearing: Weight bearing as tolerated (bedsoe brace locked in extension)      Mobility Bed Mobility Overal bed mobility: Needs Assistance Bed Mobility: Supine to Sit;Sit to Supine     Supine to sit: Min assist Sit to supine: Min assist   General bed mobility comments: Min A for LLE assist. Increased time required.     Transfers Overall transfer level: Needs assistance Equipment used: Rolling walker (2 wheeled) Transfers: Sit to/from Stand Sit to Stand: Min guard         General transfer  comment: Min guard for safety. Cues to kick LLE out when sitting and required cues for hand placement.     Balance Overall balance assessment: Needs assistance Sitting-balance support: No upper extremity supported;Feet supported Sitting balance-Leahy Scale: Good     Standing balance support: Bilateral upper extremity supported;During functional activity;Single extremity supported Standing balance-Leahy Scale: Poor Standing balance comment: Reliant on BUE support dynamically, engages in ADL tasks with 1 hand support with min guard                           ADL either performed or assessed with clinical judgement   ADL Overall ADL's : Needs assistance/impaired     Grooming: Set up;Sitting   Upper Body Bathing: Set up;Sitting   Lower Body Bathing: Minimal assistance;Sit to/from stand   Upper Body Dressing : Set up;Sitting   Lower Body Dressing: Moderate assistance;Sit to/from stand Lower Body Dressing Details (indicate cue type and reason): requires assist for L LE mgmt of clothes and socks, discussed compesnatory techniques for LB dressing; min guard sit to stand  Toilet Transfer: Min guard;Ambulation;RW Toilet Transfer Details (indicate cue type and reason): simulated technique using BSC on L side (to simulate sink at home), cueing for mgmt of L LE extension and hand placement         Functional mobility during ADLs: Min guard;Rolling walker General ADL Comments: educated on compenastory techniques and safety, limited by L LE knee extension brace and pain      Vision  Perception     Praxis      Pertinent Vitals/Pain Pain Assessment: 0-10 Pain Score: 7  Pain Location: L knee  Pain Descriptors / Indicators: Aching;Operative site guarding Pain Intervention(s): Limited activity within patient's tolerance;Monitored during session;Repositioned     Hand Dominance Right   Extremity/Trunk Assessment Upper Extremity Assessment Upper Extremity  Assessment: Overall WFL for tasks assessed   Lower Extremity Assessment Lower Extremity Assessment: Defer to PT evaluation LLE Deficits / Details: LLE in bledsoe brace throughout    Cervical / Trunk Assessment Cervical / Trunk Assessment: Normal   Communication Communication Communication: No difficulties   Cognition Arousal/Alertness: Awake/alert Behavior During Therapy: WFL for tasks assessed/performed Overall Cognitive Status: Within Functional Limits for tasks assessed                                     General Comments       Exercises     Shoulder Instructions      Home Living Family/patient expects to be discharged to:: Private residence Living Arrangements: Alone Available Help at Discharge: Friend(s);Available PRN/intermittently Type of Home: House Home Access: Stairs to enter [employer] of Steps: 2 Entrance Stairs-Rails: None Home Layout: One level     Bathroom Shower/Tub: Chief Strategy Officer: Standard Bathroom Accessibility: Yes How Accessible: Accessible via walker Home Equipment: None          Prior Functioning/Environment Level of Independence: Independent                 OT Problem List: Decreased activity tolerance;Impaired balance (sitting and/or standing);Decreased knowledge of use of DME or AE;Decreased knowledge of precautions;Pain      OT Treatment/Interventions: Self-care/ADL training;Therapeutic exercise;DME and/or AE instruction;Therapeutic activities;Patient/family education;Balance training    OT Goals(Current goals can be found in the care plan section) Acute Rehab OT Goals Patient Stated Goal: to regain independence OT Goal Formulation: With patient Time For Goal Achievement: 12/18/19 Potential to Achieve Goals: Good  OT Frequency: Min 2X/week   Barriers to D/C:            Co-evaluation              AM-PAC OT "6 Clicks" Daily Activity     Outcome Measure Help from  another person eating meals?: None Help from another person taking care of personal grooming?: A Little Help from another person toileting, which includes using toliet, bedpan, or urinal?: A Little Help from another person bathing (including washing, rinsing, drying)?: A Little Help from another person to put on and taking off regular upper body clothing?: None Help from another person to put on and taking off regular lower body clothing?: A Little 6 Click Score: 20   End of Session Equipment Utilized During Treatment: Gait belt;Rolling walker;Other (comment) (hinged knee brace in extension ) Nurse Communication: Mobility status  Activity Tolerance: Patient tolerated treatment well Patient left: in bed;with call bell/phone within reach  OT Visit Diagnosis: Other abnormalities of gait and mobility (R26.89);Pain Pain - Right/Left: Left Pain - part of body: Leg                Time: 1355-1412 OT Time Calculation (min): 17 min Charges:  OT General Charges $OT Visit: 1 Visit OT Evaluation $OT Eval Moderate Complexity: 1 Mod  Barry Brunner, OT Acute Rehabilitation Services Pager [phone] Office [phone]'  Chancy Milroy 12/11/2019, 3:10 PM

## 2019-12-12 ENCOUNTER — Encounter (HOSPITAL_COMMUNITY): Payer: Self-pay | Admitting: Student

## 2019-12-12 LAB — CBC
HCT: 31 % — ABNORMAL LOW (ref 39.0–52.0)
Hemoglobin: 10.7 g/dL — ABNORMAL LOW (ref 13.0–17.0)
MCH: 31.8 pg (ref 26.0–34.0)
MCHC: 34.5 g/dL (ref 30.0–36.0)
MCV: 92 fL (ref 80.0–100.0)
Platelets: 173 10*3/uL (ref 150–400)
RBC: 3.37 MIL/uL — ABNORMAL LOW (ref 4.22–5.81)
RDW: 13.2 % (ref 11.5–15.5)
WBC: 7.4 10*3/uL (ref 4.0–10.5)
nRBC: 0 % (ref 0.0–0.2)

## 2019-12-12 NOTE — Plan of Care (Signed)
°  Problem: Health Behavior/Discharge Planning: Goal: Ability to manage health-related needs will improve Outcome: Progressing   Problem: Clinical Measurements: Goal: Ability to maintain clinical measurements within normal limits will improve Outcome: Progressing   Problem: Education: Goal: Knowledge of General Education information will improve Description: Including pain rating scale, medication(s)/side effects and non-pharmacologic comfort measures Outcome: Progressing   Problem: Activity: Goal: Risk for activity intolerance will decrease Outcome: Progressing   Problem: Nutrition: Goal: Adequate nutrition will be maintained Outcome: Progressing   Problem: Coping: Goal: Level of anxiety will decrease Outcome: Progressing   Problem: Elimination: Goal: Will not experience complications related to bowel motility Outcome: Progressing   Problem: Pain Managment: Goal: General experience of comfort will improve Outcome: Progressing   Problem: Safety: Goal: Ability to remain free from injury will improve Outcome: Progressing   Problem: Skin Integrity: Goal: Risk for impaired skin integrity will decrease Outcome: Progressing

## 2019-12-12 NOTE — Plan of Care (Signed)

## 2019-12-12 NOTE — Plan of Care (Signed)

## 2019-12-12 NOTE — Progress Notes (Signed)
Occupational Therapy Treatment Patient Details Name: Robert [name] MRN: [id number] DOB: [birth year] Today's Date: 12/12/2019    History of present illness Pt is a 64 y/o male admitted after fall from ladder. Found to have nasal bone fx and L patellar fx. Pt is s/p ORIF of patellar fx. No pertinent PMH.    OT comments  Pt. Seen for skilled OT treament.  Able to complete bed mobility without physical assistance.  toileting tasks with min guard a.  Moving well. Note d/c home likely later today.    Follow Up Recommendations  No OT follow up;Supervision - Intermittent    Equipment Recommendations  3 in 1 bedside commode    Recommendations for Other Services      Precautions / Restrictions Precautions Precautions: Fall Precaution Comments: Had fall off of ladder Required Braces or Orthoses: Other Brace Other Brace: bledsoe brace, locked in extension       Mobility Bed Mobility Overal bed mobility: Needs Assistance Bed Mobility: Supine to Sit     Supine to sit: Min guard     General bed mobility comments: pt. able to guide b les out of bed without physical assistance.  states he will be sleeping in recliner at home  Transfers Overall transfer level: Needs assistance Equipment used: Rolling walker (2 wheeled) Transfers: Sit to/from [employer] Sit to Stand: Min guard Stand pivot transfers: Min guard       General transfer comment: Min guard for safety. Cues to kick LLE out when sitting and required cues for hand placement.     Balance                                           ADL either performed or assessed with clinical judgement   ADL Overall ADL's : Needs assistance/impaired     Grooming: Set up;Sitting;Oral care;Wash/dry face;Wash/dry Lawyer: Min guard;Ambulation;RW Toilet Transfer Details (indicate cue type and reason): demonstrated use of propping LE on trash can while seated on  commode for comfort if he wanted to Toileting- Architect and Hygiene: Min guard;Sit to/from stand       Functional mobility during ADLs: Health and safety inspector     Praxis      Cognition Arousal/Alertness: Awake/alert Behavior During Therapy: WFL for tasks assessed/performed Overall Cognitive Status: Within Functional Limits for tasks assessed                                          Exercises     Shoulder Instructions       General Comments      Pertinent Vitals/ Pain       Pain Assessment: 0-10 Pain Score: 7  Pain Location: L knee  Pain Descriptors / Indicators: Aching;Operative site guarding Pain Intervention(s): Limited activity within patient's tolerance;Premedicated before session;Monitored during session  Home Living                                          Prior Functioning/Environment  Frequency  Min 2X/week        Progress Toward Goals  OT Goals(current goals can now be found in the care plan section)  Progress towards OT goals: Progressing toward goals     Plan      Co-evaluation                 AM-PAC OT "6 Clicks" Daily Activity     Outcome Measure   Help from another person eating meals?: None Help from another person taking care of personal grooming?: A Little Help from another person toileting, which includes using toliet, bedpan, or urinal?: A Little Help from another person bathing (including washing, rinsing, drying)?: A Little Help from another person to put on and taking off regular upper body clothing?: None Help from another person to put on and taking off regular lower body clothing?: A Little 6 Click Score: 20    End of Session Equipment Utilized During Treatment: Gait belt;Rolling walker;Other (comment) (knee brace)  OT Visit Diagnosis: Other abnormalities of gait and mobility (R26.89);Pain Pain - Right/Left:  Left Pain - part of body: Leg   Activity Tolerance Patient tolerated treatment well   Patient Left in chair;with call bell/phone within reach   Nurse Communication          Time: 0729-0750 OT Time Calculation (min): 21 min  Charges: OT General Charges $OT Visit: 1 Visit OT Treatments $Self Care/Home Management : 8-22 mins  Boneta Lucks, COTA/L Acute Rehabilitation [phone]   Robert [name] 12/12/2019, 1:14 PM

## 2019-12-12 NOTE — Progress Notes (Signed)
Orthopaedic Trauma Service Progress Note  Patient ID: Robert [name] MRN: [id number] DOB/AGE: 1955-12-17 64 y.o.  Subjective:  No acute issues  Pain tolerable Only really transferred to chair today  Lives by himself   ROS As above  Objective:   VITALS:   Vitals:   12/11/19 2022 12/12/19 0012 12/12/19 0455 12/12/19 0911  BP: 115/76 125/77 127/84 129/78  Pulse: 91 86 85 79  Resp: 18 15 15 17   Temp: 98.5 F (36.9 C) 98.4 F (36.9 C) 98.4 F (36.9 C) 98.3 F (36.8 C)  TempSrc: Oral Oral Oral Oral  SpO2: 99% 99% 99% 100%  Weight:      Height:        Estimated body mass index is 26.69 kg/m as calculated from the following:   Height as of this encounter: 5\' 10"  (1.778 m).   Weight as of this encounter: 84.4 kg.   Intake/Output      11/19 0701 - 11/20 0700 11/20 0701 - 11/21 0700   P.O. 720    I.V. (mL/kg) 700 (8.3)    IV Piggyback 200    Total Intake(mL/kg) 1620 (19.2)    Urine (mL/kg/hr) 351 (0.2) 200 (0.3)   Blood 50    Total Output 401 200   Net +1219 -200        Urine Occurrence 1 x      LABS  Results for orders placed or performed during the hospital encounter of 12/10/19 (from the past 24 hour(s))  CBC     Status: Abnormal   Collection Time: 12/12/19  1:58 AM  Result Value Ref Range   WBC 7.4 4.0 - 10.5 K/uL   RBC 3.37 (L) 4.22 - 5.81 MIL/uL   Hemoglobin 10.7 (L) 13.0 - 17.0 g/dL   HCT 12/12/19 (L) 39 - 52 %   MCV 92.0 80.0 - 100.0 fL   MCH 31.8 26.0 - 34.0 pg   MCHC 34.5 30.0 - 36.0 g/dL   RDW 12/14/19 46.2 - 70.3 %   Platelets 173 150 - 400 K/uL   nRBC 0.0 0.0 - 0.2 %     PHYSICAL EXAM:   Gen: sitting up in bed, NAD, appears well  Lungs: unlabored Cardiac: RRR Abd: + BS, NTND Ext:       Left Lower Extremity   Dressing c/d/i  Ext warm   + DP pulse  Distal motor and sensory functions intact  No pain out of proportion with passive stretching   Swelling as  expected   Assessment/Plan: 1 Day Post-Op   Active Problems:   Left patella fracture   Anti-infectives (From admission, onward)   Start     Dose/Rate Route Frequency Ordered Stop   12/11/19 1600  ceFAZolin (ANCEF) IVPB 2g/100 mL premix        2 g 200 mL/hr over 30 Minutes Intravenous Every 8 hours 12/11/19 1046 12/12/19 0915   12/11/19 0902  vancomycin (VANCOCIN) powder  Status:  Discontinued          As needed 12/11/19 0902 12/11/19 0945   12/11/19 0600  ceFAZolin (ANCEF) IVPB 2g/100 mL premix        2 g 200 mL/hr over 30 Minutes Intravenous On call to O.R. 12/10/19 2221 12/11/19 0745    .  POD/HD#: 1  64 y/o fall off ladder   -  fall off ladder  - comminuted L patella fracture s/p ORIF  WBAT L leg with knee in full extension and hinged brace locked out   Dressing change tomorrow   Ice and elevate  Therapies   Will need to clear stairs before dc home  Ankle ROM as tolerated   - Pain management:  Multimodal    Continue with current regimen  - ABL anemia/Hemodynamics  Stable  - Medical issues   Home meds restarted  - DVT/PE prophylaxis:  Asa   - ID:   periop abx completed   - FEN/GI prophylaxis/Foley/Lines:  Reg diet  - Dispo:  Dc home tomorrow once therapy clears  Will Need ENT follow up for nasal fractures. Do no see consult note     Mearl Latin, PA-C [phone] (C) 12/12/2019, 1:55 PM  [facility] [address] [place] [address] [phone] Collier Bullock (F)    After 5pm and on the weekends please log on to Amion, go to orthopaedics and the look under the [facility] Call for the provider(s) on call. You can also call our office at [phone] and then follow the prompts to be connected to the call team.

## 2019-12-13 ENCOUNTER — Encounter (HOSPITAL_COMMUNITY): Payer: Self-pay | Admitting: Student

## 2019-12-13 NOTE — Discharge Summary (Signed)
Orthopaedic Trauma Service (OTS) Discharge Summary   Patient ID: Robert [name] MRN: [id number] DOB/AGE: Jun 17, 1955 64 y.o.  Admit date: 12/10/2019 Discharge date: 12/13/2019  Admission Diagnoses: Left patella fracture   Discharge Diagnoses:  Principal Problem:   Left patella fracture   History reviewed. No pertinent past medical history.   Procedures Performed: 12/11/2019- Dr. Jena Gauss   CPT 27524-Open reduction internal fixation of left patella fracture  Discharged Condition: good  Hospital Course:  64 year old male sustained an injury when falling off a ladder.  Patient sustained, left patella fracture as well as nasal fractures.  Patient was found to have isolated injuries.  He was admitted to the orthopedic trauma service on 12/11/2019.  Patient was taken to the OR on the day of admission for procedure noted above.  After surgery he was transferred to the PACU for recovery from anesthesia and then transferred to the orthopedic floor for observation, pain control and therapies.  He started with therapy on postoperative day #1 and progressed well.  He was still requiring some IV pain medication and was mobilizing slowly.  This along with the fact that he lives by himself we decided to keep him another day for additional therapy sessions.  Patient was fitted with a hinged knee brace to his left knee locked out into full extension.  He is to not remove his brace other than for some hygiene.  No knee range of motion at this time.  Patient was started on aspirin 325 mg twice daily for DVT and PE prophylaxis and he will continue on this for 3 to 4 weeks.  He was covered with Ancef for perioperative antibiosis.  He progressed well with therapies over the next 24 hours and was deemed to be stable for discharge home on postoperative day #2.  Patient did not have any additional issues during his hospital stay and was discharged in stable condition on 12/13/2019.  All necessary DME was  arranged and delivered prior to discharge.  Did review wound care with the patient.  His dressing was changed prior to discharge as well.  TED hose was applied for swelling control.  Patient will follow up OTS in 2 weeks for follow-up x-rays and initiation of range of motion.  He is weightbearing as tolerated on his left leg with his knee in full extension in his hinged knee brace on a locked in full extension.  He is encouraged to contact the office of any questions or concerns that he may have after discharge.  Consults: None  Significant Diagnostic Studies: labs:  Results for Robert [name], Robert [name] (MRN [id number]) as of 12/13/2019 12:43  Ref. Range 12/12/2019 01:58  WBC Latest Ref Range: 4.0 - 10.5 K/uL 7.4  RBC Latest Ref Range: 4.22 - 5.81 MIL/uL 3.37 (L)  Hemoglobin Latest Ref Range: 13.0 - 17.0 g/dL 60.7 (L)  HCT Latest Ref Range: 39 - 52 % 31.0 (L)  MCV Latest Ref Range: 80.0 - 100.0 fL 92.0  MCH Latest Ref Range: 26.0 - 34.0 pg 31.8  MCHC Latest Ref Range: 30.0 - 36.0 g/dL 37.1  RDW Latest Ref Range: 11.5 - 15.5 % 13.2  Platelets Latest Ref Range: 150 - 400 K/uL 173  nRBC Latest Ref Range: 0.0 - 0.2 % 0.0     Treatments: IV hydration, antibiotics: Ancef, analgesia: acetaminophen, Dilaudid and Oxy IR, anticoagulation: ASA, therapies: PT, OT and RN and surgery: as above  Discharge Exam:     Orthopaedic Trauma Service Progress Note   Patient ID: Robert [name]  MRN: [id number] DOB/AGE: April 27, 1955 64 y.o.   Subjective:   Doing well Wants to go home   PT will work with  Patient today    No acute issues of note      ROS As above   Objective:    VITALS:         Vitals:    12/12/19 1608 12/12/19 1944 12/13/19 0430 12/13/19 0739  BP: 136/78 124/85 120/78 122/76  Pulse: 83 83 80 82  Resp: Temp: 98.1 F (36.7 C) 98.6 F (37 C) 98.4 F (36.9 C) 98.1 F (36.7 C)  TempSrc: Oral Oral Oral Oral  SpO2: 100% 100% 97% 100%  Weight:          Height:               Estimated body mass index is 26.69 kg/m as calculated from the following:   Height as of this encounter:  (1.778 m).   Weight as of this encounter: 84.4 kg.     Intake/Output      11/20 0701 - 11/21 0700 11/21 0701 - 11/22 0700   P.O.  240   I.V. (mL/kg)     IV Piggyback     Total Intake(mL/kg)  240 (2.8)   Urine (mL/kg/hr) 700 (0.3) 250 (0.8)   Blood     Total Output 700 250   Net -700 -10           LABS   Lab Results Last 24 Hours  No results found for this or any previous visit (from the past 24 hour(s)).       PHYSICAL EXAM:    Gen: sitting up in chair, NAD, appears well  Lungs: unlabored Cardiac: RRR Abd: + BS, NTND Ext:       Left Lower Extremity              Dressing c/d/i                           Dressing changed                         Incision looks great                         Steri-strips intact             Moderate knee effusion              Ext warm              + DP pulse             Distal motor and sensory functions intact             No pain out of proportion with passive stretching              Swelling as expected       Assessment/Plan: 2 Days Post-Op    Active Problems:   Left patella fracture                Anti-infectives (From admission, onward)      Start     Dose/Rate Route Frequency Ordered Stop    12/11/19 1600   ceFAZolin (ANCEF) IVPB 2g/100 mL premix        2 g 200 mL/hr over 30 Minutes Intravenous Every 8 hours 12/11/19 1046 12/12/19 0915  12/11/19 0902   vancomycin (VANCOCIN) powder  Status:  Discontinued            As needed 12/11/19 0902 12/11/19 0945    12/11/19 0600   ceFAZolin (ANCEF) IVPB 2g/100 mL premix        2 g 200 mL/hr over 30 Minutes Intravenous On call to O.R. 12/10/19 2221 12/11/19 0745       .   POD/HD#: 2   64 y/o fall off ladder    - fall off ladder   - comminuted L patella fracture s/p ORIF             WBAT L leg with knee in full extension and hinged brace locked out               Dressing changed today                          mepilex placed over incision                                      Can change again in 2-3 days             TED hose              Ice and elevate             Therapies                         Will need to clear stairs before dc home             Ankle ROM as tolerated    - Pain management:             Multimodal                          Continue with current regimen   - ABL anemia/Hemodynamics             Stable   - Medical issues              Home meds restarted   - DVT/PE prophylaxis:             Asa    - ID:              periop abx completed    - FEN/GI prophylaxis/Foley/Lines:             Reg diet   - Dispo:             Dc home once therapy clears             Will Need ENT follow up for nasal fractures       Disposition: Discharge disposition: 01-Home or Self Care       Discharge Instructions    Call MD / Call 911   Complete by: As directed    If you experience chest pain or shortness of breath, CALL 911 and be transported to the hospital emergency room.  If you develope a fever above 101 F, pus (white drainage) or increased drainage or redness at the wound, or calf pain, call your surgeon's office.   Constipation Prevention   Complete by: As directed    Drink plenty of fluids.  Prune juice may be helpful.  You may  use a stool softener, such as Colace (over the counter) 100 mg twice a day.  Use MiraLax (over the counter) for constipation as needed.   Diet - low sodium heart healthy   Complete by: As directed    Increase activity slowly as tolerated   Complete by: As directed      Allergies as of 12/13/2019   No Known Allergies     Medication List    TAKE these medications   aspirin EC 325 MG tablet Take 1 tablet (325 mg total) by mouth in the morning and at bedtime. What changed:   medication strength  how much to take  when to take this   atorvastatin 20 MG tablet Commonly known as:  LIPITOR Take 20 mg by mouth at bedtime.   levothyroxine 100 MCG tablet Commonly known as: SYNTHROID Take 100 mcg by mouth daily.   methocarbamol 500 MG tablet Commonly known as: ROBAXIN Take 1 tablet (500 mg total) by mouth every 6 (six) hours as needed for muscle spasms.   ondansetron 4 MG tablet Commonly known as: ZOFRAN Take 1 tablet (4 mg total) by mouth every 6 (six) hours as needed for nausea.   ONE-A-DAY MENS 50+ ADVANTAGE PO Take 1 tablet by mouth daily.   oxyCODONE-acetaminophen 5-325 MG tablet Commonly known as: Percocet Take 1 tablet by mouth every 4 (four) hours as needed for severe pain.            Durable Medical Equipment  (From admission, onward)         Start     Ordered   12/11/19 1714  For home use only DME 3 n 1  Once        12/11/19 1713   12/11/19 1713  For home use only DME Walker rolling  Once       Question Answer Comment  Walker: With 5 Inch Wheels   Patient needs a walker to treat with the following condition Left patella fracture      12/11/19 1713          Follow-up Information    Haddix, Gillie Manners, MD. Schedule an appointment as soon as possible for a visit in 2 weeks.   Specialty: Orthopedic Surgery Why: For wound re-check, for repeat x-rays Contact information: [address] [place] [address] [phone]        [facility] Follow up.   Specialty: Home Health Services Why: home health PT service will be provided by [facility]. Start of care will be the week after Thanksgiving Contact information: [address] [place] [address] [phone]        Newman Pies, MD. Schedule an appointment as soon as possible for a visit in 1 week(s).   Specialty: Otolaryngology Why: for nasal fracture  Contact information: [address] [place] [address] [phone]        [facility] Follow up.   Why: Rolling Walker and Bedside commode to be delivered to room priot to transition  home.  Contact information: [address] [place] [address] [phone]               Discharge Instructions and Plan:  64 y/o male s/p fall off ladder with L patella fracture and nasal fractures   Weightbearing: WBAT Left leg with knee in full extension and hinged knee brace on locked in full extension  Insicional and dressing care: Daily dressing changes with 4x4's and  tape or mepilex Orthopedic device(s): hinged knee brace locked in full extension, walker  Showering: ok to shower and clean with soap and water  VTE prophylaxis: Aspirin 325mg  BID x 4 weeks  Pain control: multimodal Follow - up plan: 2 weeks   Signed:  , PA-C [phone] (C) 12/13/2019, 12:17 PM  [facility] [address] [place] [phone] (F)

## 2019-12-13 NOTE — Progress Notes (Addendum)
Orthopaedic Trauma Service Progress Note  Patient ID: Robert [name] MRN: [id number] DOB/AGE: 07-14-1955 63 y.o.  Subjective:  Doing well Wants to go home  PT will work with  Patient today   No acute issues of note    ROS As above  Objective:   VITALS:   Vitals:   12/12/19 1608 12/12/19 1944 12/13/19 0430 12/13/19 0739  BP: 136/78 124/85 120/78 122/76  Pulse: 83 83 80 82  Resp: 17 18 16 18   Temp: 98.1 F (36.7 C) 98.6 F (37 C) 98.4 F (36.9 C) 98.1 F (36.7 C)  TempSrc: Oral Oral Oral Oral  SpO2: 100% 100% 97% 100%  Weight:      Height:        Estimated body mass index is 26.69 kg/m as calculated from the following:   Height as of this encounter: 5\' 10"  (1.778 m).   Weight as of this encounter: 84.4 kg.   Intake/Output      11/20 0701 - 11/21 0700 11/21 0701 - 11/22 0700   P.O.  240   I.V. (mL/kg)     IV Piggyback     Total Intake(mL/kg)  240 (2.8)   Urine (mL/kg/hr) 700 (0.3) 250 (0.8)   Blood     Total Output 700 250   Net -700 -10          LABS  No results found for this or any previous visit (from the past 24 hour(s)).   PHYSICAL EXAM:   Gen: sitting up in chair, NAD, appears well  Lungs: unlabored Cardiac: RRR Abd: + BS, NTND Ext:       Left Lower Extremity              Dressing c/d/i    Dressing changed   Incision looks great   Steri-strips intact  Moderate knee effusion              Ext warm              + DP pulse             Distal motor and sensory functions intact             No pain out of proportion with passive stretching              Swelling as expected     Assessment/Plan: 2 Days Post-Op   Active Problems:   Left patella fracture   Anti-infectives (From admission, onward)   Start     Dose/Rate Route Frequency Ordered Stop   12/11/19 1600  ceFAZolin (ANCEF) IVPB 2g/100 mL premix        2 g 200 mL/hr over 30 Minutes Intravenous Every 8  hours 12/11/19 1046 12/12/19 0915   12/11/19 0902  vancomycin (VANCOCIN) powder  Status:  Discontinued          As needed 12/11/19 0902 12/11/19 0945   12/11/19 0600  ceFAZolin (ANCEF) IVPB 2g/100 mL premix        2 g 200 mL/hr over 30 Minutes Intravenous On call to O.R. 12/10/19 2221 12/11/19 0745    .  POD/HD#: 2  64 y/o fall off ladder    - fall off ladder   - comminuted L patella fracture s/p ORIF  WBAT L leg with knee in full extension and hinged brace locked out              Dressing changed today    mepilex placed over incision     Can change again in 2-3 days  TED hose              Ice and elevate             Therapies                         Will need to clear stairs before dc home             Ankle ROM as tolerated    - Pain management:             Multimodal                          Continue with current regimen   - ABL anemia/Hemodynamics             Stable   - Medical issues              Home meds restarted   - DVT/PE prophylaxis:             Asa    - ID:              periop abx completed    - FEN/GI prophylaxis/Foley/Lines:             Reg diet   - Dispo:             Dc home once therapy clears             Will Need ENT follow up for nasal fractures    Mearl Latin, PA-C [phone] (C) 12/13/2019, 10:44 AM  [facility] [address] [place] [address] [phone] Val [phone] (F)    After 5pm and on the weekends please log on to Amion, go to orthopaedics and the look under the [facility] Call for the provider(s) on call. You can also call our office at [phone] and then follow the prompts to be connected to the call team.

## 2019-12-13 NOTE — TOC Initial Note (Signed)
Transition of Care [facility]) - Initial/Assessment Note    Patient Details  Name: Robert [name] MRN: [id number] Date of Birth: 1955/05/11  [facility]) CM/SW Contact:    Gala Lewandowsky, RN Phone Number: 12/13/2019, 10:19 AM  Clinical Narrative: Plan for patient to transition home today. Previous Case Manager set patient up with [facility] for Physical Therapy-Case Manager called to verify. Patient in need of durable medical equipment (DME) bedside commode and rolling walker via [employer]-to be delivered to bedside. Staff RN is aware to hold patient until DME arrives. Patient states he has support of church members and neighbors to assist in the home. Patient has transportation home. Physical Therapy to see the patient to clear him of stairs prior to transition home.                    Expected Discharge Plan: Home w Home Health Services Barriers to Discharge: No Barriers Identified   Patient Goals and CMS Choice Patient states their goals for this hospitalization and ongoing recovery are:: to return home   Choice offered to / list presented to : Patient (Previous Case Manager made the referral with [facility])  Expected Discharge Plan and Services Expected Discharge Plan: Home w Home Health Services In-house Referral: NA Discharge Planning Services: CM Consult Post Acute Care Choice: Home Health Living arrangements for the past 2 months: Single Family Home Expected Discharge Date: 12/12/19               DME Arranged: Bedside commode, Walker rolling DME Agency: [employer] Date DME Agency Contacted: 12/13/19 Time DME Agency Contacted: 1000 Representative spoke with at DME Agency: Arnold Long HH Arranged: PT HH [facility]: [facility] [employer] [facility] Date HH Agency Contacted: 12/12/19 Time HH Agency Contacted: 1201 Representative spoke with at [facility] Agency: Cam Hai  Prior Living Arrangements/Services Living arrangements for the past 2 months: Single Family  Home Lives with:: Self Patient language and need for interpreter reviewed:: Yes Do you feel safe going back to the place where you live?: Yes      Need for Family Participation in Patient Care: Yes (Comment) (Patient has neighbors and church family to assist.) Care giver support system in place?: Yes (comment)   Criminal Activity/Legal Involvement Pertinent to Current Situation/Hospitalization: No - Comment as needed  Activities of Daily Living Home Assistive Devices/Equipment: Eyeglasses ADL Screening (condition at time of admission) Patient's cognitive ability adequate to safely complete daily activities?: Yes Is the patient deaf or have difficulty hearing?: No Does the patient have difficulty seeing, even when wearing glasses/contacts?: No Does the patient have difficulty concentrating, remembering, or making decisions?: No Patient able to express need for assistance with ADLs?: Yes Does the patient have difficulty dressing or bathing?: No Independently performs ADLs?: Yes (appropriate for developmental age) Does the patient have difficulty walking or climbing stairs?: Yes Weakness of Legs: Left Weakness of Arms/Hands: None  Permission Sought/Granted Permission sought to share information with : Case Manager, Magazine features editor (neighbors to support the patient.) Permission granted to share information with : Yes, Verbal Permission Granted     Permission granted to share info w AGENCY: [facility] Weekend Number for verification.        Emotional Assessment   Attitude/Demeanor/Rapport: Engaged Affect (typically observed): Appropriate Orientation: : Oriented to Situation, Oriented to  Time, Oriented to Place, Oriented to Self Alcohol / Substance Use: Not Applicable Psych Involvement: No (comment)  Admission diagnosis:  Fall [W19.XXXA] Left patella fracture [S82.002A] Closed  fracture of nasal bone, initial encounter [S02.2XXA] Closed nondisplaced comminuted fracture of  left patella, initial encounter [S82.045A] Patient Active Problem List   Diagnosis Date Noted  . Left patella fracture 12/10/2019   PCP:  Dettinger, Elige Radon, MD Pharmacy:   [id number] - [place], [place] - [address] [place] [address] Phone: [phone] Fax: [phone]     Readmission Risk Interventions No flowsheet data found.

## 2019-12-13 NOTE — Progress Notes (Signed)
Pt was ambulating with walker to the hall with PT. LLE with hinged knee brace on. Left knee dressing dry and intact. Pain is controlled with oral narcotics. Discharge instructions given to pt. Discharged to home accompanied by friends.

## 2019-12-13 NOTE — Progress Notes (Signed)
Physical Therapy Treatment Patient Details Name: Robert [name] MRN: [id number] DOB: [birth year] Today's Date: 12/13/2019    History of Present Illness Pt is a 64 y/o male admitted after fall from ladder. Found to have nasal bone fx and L patellar fx. Pt is s/p ORIF of patellar fx. No pertinent PMH.     PT Comments    Continuing work on functional mobility and activity tolerance;  Session focused on stair negotiation in prep for dc home today; Able to go up and down 2 consecutive steps with min assist form someone to steady RW; practiced twice; Questions solicited and answered; OK for dc home from PT standpoint    Follow Up Recommendations  Home health PT     Equipment Recommendations  Rolling walker with 5" wheels;3in1 (PT)    Recommendations for Other Services       Precautions / Restrictions Precautions Precautions: Fall Precaution Comments: Had fall off of ladder Required Braces or Orthoses: Other Brace Other Brace: bledsoe brace, locked in extension Restrictions LLE Weight Bearing: Weight bearing as tolerated    Mobility  Bed Mobility                  Transfers Overall transfer level: Needs assistance Equipment used: Rolling walker (2 wheeled) Transfers: Sit to/from Stand Sit to Stand: Min guard         General transfer comment: Min guard for safety. Cues to kick LLE out when sitting and required cues for hand placement.   Ambulation/Gait Ambulation/Gait assistance: Min guard Gait Distance (Feet): 140 Feet Assistive device: Rolling walker (2 wheeled) Gait Pattern/deviations: Step-to pattern;Step-through pattern;Decreased weight shift to left;Antalgic Gait velocity: Decreased   General Gait Details: Mildly antalgic gait. Cues for sequencing using RW.    Stairs Stairs: Yes Stairs assistance: Min guard Stair Management: Step to pattern;Backwards;With walker Number of Stairs: 2 (x2) General stair comments: Practiced going up and down steps using RW.  Cues for sequencing. Educated how family/friends needed to assist with holding RW. Min guard for safety. Cues for LE sequencing.    Wheelchair Mobility    Modified Rankin (Stroke Patients Only)       Balance     Sitting balance-Leahy Scale: Good       Standing balance-Leahy Scale: Poor                              Cognition Arousal/Alertness: Awake/alert Behavior During Therapy: WFL for tasks assessed/performed Overall Cognitive Status: Within Functional Limits for tasks assessed                                        Exercises      General Comments        Pertinent Vitals/Pain Pain Assessment: 0-10 Pain Score: 6  Pain Location: L knee  Pain Descriptors / Indicators: Aching;Operative site guarding Pain Intervention(s): Monitored during session    Home Living                      Prior Function            PT Goals (current goals can now be found in the care plan section) Acute Rehab PT Goals Patient Stated Goal: to regain independence PT Goal Formulation: With patient Time For Goal Achievement: 12/25/19 Potential to Achieve Goals: Good Progress towards PT goals: Progressing toward  goals    Frequency    Min 5X/week      PT Plan Current plan remains appropriate    Co-evaluation              AM-PAC PT "6 Clicks" Mobility   Outcome Measure  Help needed turning from your back to your side while in a flat bed without using bedrails?: None Help needed moving from lying on your back to sitting on the side of a flat bed without using bedrails?: A Little Help needed moving to and from a bed to a chair (including a wheelchair)?: A Little Help needed standing up from a chair using your arms (e.g., wheelchair or bedside chair)?: A Little Help needed to walk in hospital room?: None Help needed climbing 3-5 steps with a railing? : A Little 6 Click Score: 20    End of Session Equipment Utilized During Treatment:  Gait belt;Other (comment) (L bledsoe brace) Activity Tolerance: Patient tolerated treatment well Patient left: in chair;with call bell/phone within reach Nurse Communication: Mobility status PT Visit Diagnosis: History of falling (Z91.81);Pain Pain - Right/Left: Left Pain - part of body: Knee     Time: 7824-2353 PT Time Calculation (min) (ACUTE ONLY): 17 min  Charges:  $Gait Training: 8-22 mins                     Van Clines, PT  Acute Rehabilitation Services Pager [phone] Office [phone]    Robert [name] 12/13/2019, 1:11 PM

## 2019-12-14 ENCOUNTER — Encounter: Payer: Self-pay | Admitting: Family Medicine

## 2019-12-14 ENCOUNTER — Encounter (HOSPITAL_COMMUNITY): Payer: Self-pay | Admitting: Student

## 2019-12-21 DIAGNOSIS — J310 Chronic rhinitis: Secondary | ICD-10-CM | POA: Diagnosis not present

## 2019-12-21 DIAGNOSIS — J342 Deviated nasal septum: Secondary | ICD-10-CM | POA: Diagnosis not present

## 2019-12-22 ENCOUNTER — Other Ambulatory Visit: Payer: Self-pay | Admitting: Family Medicine

## 2019-12-29 DIAGNOSIS — S82002D Unspecified fracture of left patella, subsequent encounter for closed fracture with routine healing: Secondary | ICD-10-CM | POA: Diagnosis not present

## 2020-01-26 DIAGNOSIS — S82002D Unspecified fracture of left patella, subsequent encounter for closed fracture with routine healing: Secondary | ICD-10-CM | POA: Diagnosis not present

## 2020-02-01 ENCOUNTER — Ambulatory Visit: Payer: Medicare HMO | Attending: Student | Admitting: Physical Therapy

## 2020-02-01 ENCOUNTER — Encounter: Payer: Self-pay | Admitting: Physical Therapy

## 2020-02-01 ENCOUNTER — Other Ambulatory Visit: Payer: Self-pay

## 2020-02-01 DIAGNOSIS — M25562 Pain in left knee: Secondary | ICD-10-CM | POA: Diagnosis not present

## 2020-02-01 DIAGNOSIS — M6281 Muscle weakness (generalized): Secondary | ICD-10-CM | POA: Diagnosis not present

## 2020-02-01 DIAGNOSIS — M25662 Stiffness of left knee, not elsewhere classified: Secondary | ICD-10-CM | POA: Diagnosis not present

## 2020-02-01 DIAGNOSIS — R600 Localized edema: Secondary | ICD-10-CM | POA: Diagnosis not present

## 2020-02-01 NOTE — Therapy (Signed)
[facility] [address] [place], [place], [address] Phone: [phone]   Fax:  [phone]  Physical Therapy Evaluation  Patient Details  Name: Robert [name] MRN: [id number] Date of Birth: November 18, 1955 Referring Provider (PT): Ulyses Southward, PA-C   Encounter Date: 02/01/2020   PT End of Session - 02/01/20 1313    Visit Number 1    Number of Visits 18    Date for PT Re-Evaluation 04/04/20    Authorization Type [employer] Medicare (CQ modifier, KX modifier after 15th visit) FOTO; PROGRESS NOTE EVERY 10TH VISIT    PT Start Time 0945    PT Stop Time 1030    PT Time Calculation (min) 45 min    Equipment Utilized During Treatment Left knee immobilizer   unlocked hinge brace   Activity Tolerance Patient tolerated treatment well    Behavior During Therapy WFL for tasks assessed/performed           Past Medical History:  Diagnosis Date  . Thyroid disease     Past Surgical History:  Procedure Laterality Date  . COLONOSCOPY  1980's   in [facility]  . HERNIA REPAIR  03/2014   Umbilical  . NO PAST SURGERIES    . ORIF PATELLA Left 12/11/2019   Procedure: OPEN REDUCTION INTERNAL (ORIF) FIXATION PATELLA;  Surgeon: Roby Lofts, MD;  Location: [facility] OR;  Service: Orthopedics;  Laterality: Left;    There were no vitals filed for this visit.    Subjective Assessment - 02/01/20 1307    Subjective COVID-19 screening performed upon arrival. Patient arrived to physical therapy status post left patella ORIF on 12/11/2019. Patient reports falling from a ladder when he sustained his patella fracture. Patient reports ability to perform all ADLs but with increased time as he lives alone. Patient reported some difficulties with getting in/out of tub shower but has learned to manage LE to safely enter/exit tub shower. Patient reports standing on L LE increases pain to about 4/10 and pain at best as 1-2/10 with rest and tylenol as needed. 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 Yes    Pain Score 3     Pain Location Knee    Pain Orientation Left    Pain Descriptors / Indicators Sharp    Pain Type Surgical pain    Pain Onset More than a month ago    Pain Frequency Constant    Aggravating Factors  standing a lot    Pain Relieving Factors tylenol    Effect of Pain on Daily Activities just takes increased time              [facility] PT Assessment - 02/01/20 0001      Assessment   Medical Diagnosis L patella fracture, S/P ORIF    Referring Provider (PT) Ulyses Southward, PA-C    Onset Date/Surgical Date 12/11/19    Next MD Visit 03/01/2020    Prior Therapy no      Precautions   Precautions Other (comment)    Precaution Comments Start at 60 deg knee flexion in hing brace and increase as tolerated, okay to unlock  brace while ambulating per referral    Required Braces or Orthoses Knee Immobilizer - Left    Knee Immobilizer - Left On when out of bed or walking      Restrictions   Other Position/Activity Restrictions WBAT      Balance Screen   Has the patient fallen in the past 6 months Yes    How many times? 1    Has the patient had a decrease in activity level because of a fear of falling?  No    Is the patient reluctant to leave their home because of a fear of falling?  No      Home Environment   Living Environment Private residence    Living Arrangements Alone    Type of Home House    Home Access Stairs to enter    Entrance Stairs-Number of Steps 2      Prior Function   Level of Independence Independent with basic ADLs       Observation/Other Assessments   Observations left hinge brace donned    Focus on Therapeutic Outcomes (FOTO)  47% limitation      ROM / Strength   AROM / PROM / Strength AROM;PROM      AROM   Overall AROM  Deficits;Due to pain    AROM Assessment Site Knee    Right/Left Knee Left    Left Knee Extension 19    Left Knee Flexion 58      PROM   Overall PROM  Deficits;Due to pain    PROM Assessment Site Knee    Right/Left Knee Left    Left Knee Extension 14    Left Knee Flexion 64      Palpation   Patella mobility very limited L patella mobilizations in all planes    Palpation comment tenderness to left superior patella and distal hamstrings      Transfers   Transfers Independent with all Transfers      Ambulation/Gait   Gait Pattern Step-through pattern;Decreased stance time - left;Decreased step length - right;Decreased stride length;Decreased hip/knee flexion - left;Decreased dorsiflexion - left;Decreased weight shift to left                      Objective measurements completed on examination: See above findings.       [facility] Adult PT Treatment/Exercise - 02/01/20 0001      Modalities   Modalities Vasopneumatic      Vasopneumatic   Number Minutes Vasopneumatic  10 minutes    Vasopnuematic Location  Knee    Vasopneumatic Pressure Low    Vasopneumatic Temperature  34 for edema                  PT Education - 02/01/20 1313    Education Details supine and seated heel slides, quad sets, prone hang    Person(s) Educated Patient    Methods Explanation;Demonstration;Handout    Comprehension Verbalized understanding               PT Long Term Goals - 02/01/20 1504      PT LONG TERM GOAL #1   Title Patient will be independent with HEP    Time 8    Period Weeks    Status New      PT LONG TERM GOAL #2   Title Patient will demonstrate 115+ degrees of left knee flexion AROM to improve ability to perform functional tasks.    Time 8  Period Weeks    Status New      PT LONG TERM GOAL #3   Title Patient will demonstrate 5 degrees or less of left knee extension AROM to improve gait mechanics.    Time 8    Period Weeks    Status New      PT LONG TERM GOAL #4   Title Patient will ambulate community distances with minimal gait deviations and left knee pain less than or equal to 2/10.    Time 8    Period Weeks    Status New      PT LONG TERM GOAL #5   Title Patient will report ability to perform ADLs and recreational activities with left knee pain less than or equal to 2/10.    Time 8    Period Weeks    Status New                  Plan - 02/01/20 1500    Clinical Impression Statement Patient is a 65 year old male who presents to physical therapy with left knee pain, decreased ROM and abnormal gait secondary to a left patella ORIF on 12/11/2019. Patient with limited patella mobilization in all planes with significant swelling throughout the knee. Patient with tenderness to L patella and distal hamstrings. Patient ambulates with left hinge brace unlocked with decreased left stance time, increased left knee flexion, and decreased knee flexion during swing. Patient and PT discussed plan of care and discussed HEP to which patient reported understanding. Patient would benefit from skilled physical therapy to address deficits and address patient's goals.    Personal Factors and Comorbidities Comorbidity 2    Comorbidities L patella ORIF 12/11/2019, hypothyroidism    Examination-Activity Limitations Squat;Stand;Locomotion Level;Bend    Stability/Clinical Decision Making Stable/Uncomplicated    Clinical Decision Making Low    Rehab Potential Good    PT Frequency 2x / week    PT Duration 8 weeks    PT Treatment/Interventions ADLs/Self Care Home Management;Cryotherapy;Electrical Stimulation;Moist Heat;Iontophoresis 4mg /ml Dexamethasone;Gait training;Stair training;Functional mobility training;Therapeutic  activities;Therapeutic exercise;Balance training;Manual techniques;Passive range of motion;Patient/family education;Vasopneumatic Device;Taping    PT Next Visit Plan nustep, PROM for knee flexion and extension, gait training, modalities PRN for pain relief.    PT Home Exercise Plan see patient education section    Consulted and Agree with Plan of Care Patient           Patient will benefit from skilled therapeutic intervention in order to improve the following deficits and impairments:  Abnormal gait,Decreased range of motion,Difficulty walking,Decreased activity tolerance,Decreased balance,Decreased strength,Increased edema,Pain  Visit Diagnosis: Stiffness of left knee, not elsewhere classified - Plan: PT plan of care cert/re-cert  Acute pain of left knee - Plan: PT plan of care cert/re-cert  Localized edema - Plan: PT plan of care cert/re-cert  Muscle weakness (generalized) - Plan: PT plan of care cert/re-cert     Problem List Patient Active Problem List   Diagnosis Date Noted  . Left patella fracture 12/10/2019  . Overweight (BMI 25.0-29.9) 09/20/2017  . Hypothyroidism following radioiodine therapy 03/11/2015  . Urinary frequency 12/29/2014  . Thoracic aorta atherosclerosis (HCC) 04/13/2014    04/15/2014, PT, DPT 02/01/2020, 3:09 PM  [facility] [address] [place], [place], [place] Phone: [phone]   Fax:  [phone]  Name: Finis Hendricksen MRN: [name] Date of Birth: 19-Mar-1955

## 2020-02-04 ENCOUNTER — Other Ambulatory Visit: Payer: Self-pay

## 2020-02-04 ENCOUNTER — Ambulatory Visit: Payer: Medicare HMO | Admitting: Physical Therapy

## 2020-02-04 ENCOUNTER — Encounter: Payer: Self-pay | Admitting: Physical Therapy

## 2020-02-04 DIAGNOSIS — R600 Localized edema: Secondary | ICD-10-CM

## 2020-02-04 DIAGNOSIS — M25562 Pain in left knee: Secondary | ICD-10-CM | POA: Diagnosis not present

## 2020-02-04 DIAGNOSIS — M25662 Stiffness of left knee, not elsewhere classified: Secondary | ICD-10-CM | POA: Diagnosis not present

## 2020-02-04 DIAGNOSIS — M6281 Muscle weakness (generalized): Secondary | ICD-10-CM | POA: Diagnosis not present

## 2020-02-04 NOTE — Therapy (Signed)
[facility] [address] [place], [place], [address] Phone: [phone]   Fax:  [phone]  Physical Therapy Treatment  Patient Details  Name: Robert [name] MRN: [id number] Date of Birth: January 05, 1956 Referring Provider (PT): Ulyses Southward, PA-C   Encounter Date: 02/04/2020   PT End of Session - 02/04/20 0956    Visit Number 2    Number of Visits 18    Date for PT Re-Evaluation 04/04/20    Authorization Type [employer] Medicare (CQ modifier, KX modifier after 15th visit) FOTO; PROGRESS NOTE EVERY 10TH VISIT    PT Start Time 0949    PT Stop Time 1033    PT Time Calculation (min) 44 min    Equipment Utilized During Treatment Left knee immobilizer    Activity Tolerance Patient tolerated treatment well    Behavior During Therapy [facility] for tasks assessed/performed           Past Medical History:  Diagnosis Date  . Thyroid disease     Past Surgical History:  Procedure Laterality Date  . COLONOSCOPY  1980's   in [facility]  . HERNIA REPAIR  03/2014   Umbilical  . NO PAST SURGERIES    . ORIF PATELLA Left 12/11/2019   Procedure: OPEN REDUCTION INTERNAL (ORIF) FIXATION PATELLA;  Surgeon: Roby Lofts, MD;  Location: [facility] OR;  Service: Orthopedics;  Laterality: Left;    There were no vitals filed for this visit.   Subjective Assessment - 02/04/20 0945    Subjective COVID 19 screening performed on patient upon arrival. Patient reports he is doing some exercises at home.    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 Yes    Pain Score 4     Pain Location Knee    Pain Orientation Left    Pain Descriptors / Indicators Sore    Pain Type Surgical pain    Pain Onset More than a month ago    Pain Frequency Constant               [facility] PT Assessment - 02/04/20 0001      Assessment   Medical Diagnosis L patella fracture, S/P ORIF    Referring Provider (PT) Ulyses Southward, PA-C    Onset Date/Surgical Date 12/11/19    Next MD Visit 03/01/2020    Prior Therapy no      Precautions   Precautions Other (comment)    Precaution Comments Start at 60 deg knee flexion in hing brace and increase as tolerated, okay to unlock brace while ambulating per referral    Required Braces or Orthoses Knee Immobilizer - Left    Knee Immobilizer - Left On when out of bed or walking      Restrictions   Other Position/Activity Restrictions WBAT                         [facility] Adult PT Treatment/Exercise - 02/04/20 0001      Exercises   Exercises Knee/Hip      Knee/Hip Exercises: Aerobic   Nustep L2, seat 12 x15 min   immobilizer donned     Knee/Hip Exercises: Supine   Quad Sets Strengthening;Left;10 reps    Quad Sets Limitations 5 sec holds    Hip Adduction Isometric  Strengthening;Both;10 reps    Hip Adduction Isometric Limitations 5 sec holds    Other Supine Knee/Hip Exercises L HS set x10 reps 5 sec holds      Modalities   Modalities Vasopneumatic      Vasopneumatic   Number Minutes Vasopneumatic  15 minutes    Vasopnuematic Location  Knee    Vasopneumatic Pressure Medium    Vasopneumatic Temperature  34 for edema      Manual Therapy   Manual Therapy Passive ROM    Passive ROM PROM of L knee into flexion to progress ROM                       PT Long Term Goals - 02/04/20 1050      PT LONG TERM GOAL #1   Title Patient will be independent with HEP    Time 8    Period Weeks    Status Achieved      PT LONG TERM GOAL #2   Title Patient will demonstrate 115+ degrees of left knee flexion AROM to improve ability to perform functional tasks.    Time 8    Period Weeks    Status On-going      PT LONG TERM GOAL #3   Title Patient will demonstrate 5 degrees or less of left knee  extension AROM to improve gait mechanics.    Time 8    Period Weeks    Status On-going      PT LONG TERM GOAL #4   Title Patient will ambulate community distances with minimal gait deviations and left knee pain less than or equal to 2/10.    Time 8    Period Weeks    Status On-going      PT LONG TERM GOAL #5   Title Patient will report ability to perform ADLs and recreational activities with left knee pain less than or equal to 2/10.    Time 8    Period Weeks    Status On-going                 Plan - 02/04/20 1041    Clinical Impression Statement Patient presented in clinic with reports of soreness from HEP at home. Patient able to tolerate progression to light ROM and strengthening. Deficient quad activation noted even with tactile cues from towel. Patient required mod multimodal cueing to improve QS and HS set techniques. Great limitation noted with PROM of L knee flexion with reports of discomfort around avaliable end range. Patient reports compliance with HEP at home as well. Normal vasopneumatic response noted following removal of the modality.    Personal Factors and Comorbidities Comorbidity 2    Comorbidities L patella ORIF 12/11/2019, hypothyroidism    Examination-Activity Limitations Squat;Stand;Locomotion Level;Bend    Stability/Clinical Decision Making Stable/Uncomplicated    Rehab Potential Good    PT Frequency 2x / week    PT Duration 8 weeks    PT Treatment/Interventions ADLs/Self Care Home Management;Cryotherapy;Electrical Stimulation;Moist Heat;Iontophoresis 4mg /ml Dexamethasone;Gait training;Stair training;Functional mobility training;Therapeutic activities;Therapeutic exercise;Balance training;Manual techniques;Passive range of motion;Patient/family education;Vasopneumatic Device;Taping    PT Next Visit Plan nustep, PROM for knee flexion and extension, gait training, modalities PRN for pain relief.    PT Home Exercise Plan see patient education section     Consulted and Agree with Plan of Care Patient           Patient will benefit from skilled therapeutic intervention in order to improve the following deficits  and impairments:  Abnormal gait,Decreased range of motion,Difficulty walking,Decreased activity tolerance,Decreased balance,Decreased strength,Increased edema,Pain  Visit Diagnosis: Stiffness of left knee, not elsewhere classified  Acute pain of left knee  Localized edema  Muscle weakness (generalized)     Problem List Patient Active Problem List   Diagnosis Date Noted  . Left patella fracture 12/10/2019  . Overweight (BMI 25.0-29.9) 09/20/2017  . Hypothyroidism following radioiodine therapy 03/11/2015  . Urinary frequency 12/29/2014  . Thoracic aorta atherosclerosis (HCC) 04/13/2014    Marvell Fuller, PTA 02/04/2020, 10:50 AM  [facility] [address] [place], [place], [address] Phone: [phone]   Fax:  [phone]  Name: Haris Baack MRN: [id number] Date of Birth: [birth year]

## 2020-02-09 ENCOUNTER — Encounter: Payer: Self-pay | Admitting: Physical Therapy

## 2020-02-09 ENCOUNTER — Telehealth: Payer: Self-pay

## 2020-02-09 ENCOUNTER — Ambulatory Visit: Payer: Medicare HMO | Admitting: Physical Therapy

## 2020-02-09 ENCOUNTER — Other Ambulatory Visit: Payer: Self-pay

## 2020-02-09 DIAGNOSIS — M6281 Muscle weakness (generalized): Secondary | ICD-10-CM | POA: Diagnosis not present

## 2020-02-09 DIAGNOSIS — M25662 Stiffness of left knee, not elsewhere classified: Secondary | ICD-10-CM

## 2020-02-09 DIAGNOSIS — R600 Localized edema: Secondary | ICD-10-CM

## 2020-02-09 DIAGNOSIS — M25562 Pain in left knee: Secondary | ICD-10-CM

## 2020-02-09 NOTE — Telephone Encounter (Signed)
Patient has developed a rash on his inner thigh from wearing his knee brace.  He had tried using OTC cream for it but he had to stop using it because it made the rash burn.  Could something be called in for him to use.  Please call. [facility]

## 2020-02-09 NOTE — Therapy (Signed)
[facility] [address] [place], [place], [address] Phone: [phone]   Fax:  [phone]  Physical Therapy Treatment  Patient Details  Name: Robert [name] MRN: [id number] Date of Birth: [birth year] Referring Provider (PT): Ulyses Southward, PA-C   Encounter Date: 02/09/2020   PT End of Session - 02/09/20 0903    Visit Number 3    Number of Visits 18    Date for PT Re-Evaluation 04/04/20    Authorization Type [employer]na Medicare (CQ modifier, KX modifier after 15th visit) FOTO; PROGRESS NOTE EVERY 10TH VISIT    PT Start Time 0901    PT Stop Time 0944    PT Time Calculation (min) 43 min    Equipment Utilized During Treatment Left knee immobilizer    Activity Tolerance Patient tolerated treatment well    Behavior During Therapy [facility] for tasks assessed/performed           Past Medical History:  Diagnosis Date  . Thyroid disease     Past Surgical History:  Procedure Laterality Date  . COLONOSCOPY  1980's   in [facility]  . HERNIA REPAIR  03/2014   Umbilical  . NO PAST SURGERIES    . ORIF PATELLA Left 12/11/2019   Procedure: OPEN REDUCTION INTERNAL (ORIF) FIXATION PATELLA;  Surgeon: Roby Lofts, MD;  Location: [facility] OR;  Service: Orthopedics;  Laterality: Left;    There were no vitals filed for this visit.   Subjective Assessment - 02/09/20 0859    Subjective COVID 19 screening performed on patient upon arrival. Patient reports more pain from the abrasion at proximal strap of brace on medial L thigh. OTC creams burn when he applies them so he contacted his PCP for advice. Patient reports intermittant sharp medial knee pain but that is far less than abrasion/rash discomfort.    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 Yes    Pain Score 3     Pain Location Leg    Pain Orientation Left;Proximal;Medial    Pain Descriptors / Indicators Discomfort    Pain Type Acute pain    Pain Onset More than a month ago    Pain Frequency Constant              [facility] PT Assessment - 02/09/20 0001      Assessment   Medical Diagnosis L patella fracture, S/P ORIF    Referring Provider (PT) Ulyses Southward, PA-C    Onset Date/Surgical Date 12/11/19    Next MD Visit 03/01/2020    Prior Therapy no      Precautions   Precautions Other (comment)    Precaution Comments Start at 60 deg knee flexion in hing brace and increase as tolerated, okay to unlock brace while ambulating per referral    Required Braces or Orthoses Knee Immobilizer - Left    Knee Immobilizer - Left On when out of bed or walking      ROM / Strength   AROM / PROM / Strength PROM      PROM   Overall PROM  Deficits    Overall PROM Comments with heel slide    PROM Assessment Site Knee    Right/Left Knee Left    Left Knee Flexion 84  [facility] Adult PT Treatment/Exercise - 02/09/20 0001      Ambulation/Gait   Gait Comments Hinge brace unlocked to 80 deg 02/09/2020      Knee/Hip Exercises: Aerobic   Nustep L1, seat 12 x20 min for ROM   no brace donned due to skin abrasion along medial L thigh     Knee/Hip Exercises: Supine   Quad Sets Strengthening;Left;15 reps    Quad Sets Limitations 5 sec holds    Short Arc Quad Sets AROM;Left;15 reps    Heel Slides AAROM;Left;20 reps    Other Supine Knee/Hip Exercises L HS set x15 reps 5 sec holds      Modalities   Modalities Vasopneumatic      Vasopneumatic   Number Minutes Vasopneumatic  10 minutes    Vasopnuematic Location  Knee    Vasopneumatic Pressure Medium    Vasopneumatic Temperature  34 for edema                       PT Long Term Goals - 02/04/20 1050      PT LONG TERM GOAL #1   Title Patient will be independent with HEP    Time 8     Period Weeks    Status Achieved      PT LONG TERM GOAL #2   Title Patient will demonstrate 115+ degrees of left knee flexion AROM to improve ability to perform functional tasks.    Time 8    Period Weeks    Status On-going      PT LONG TERM GOAL #3   Title Patient will demonstrate 5 degrees or less of left knee extension AROM to improve gait mechanics.    Time 8    Period Weeks    Status On-going      PT LONG TERM GOAL #4   Title Patient will ambulate community distances with minimal gait deviations and left knee pain less than or equal to 2/10.    Time 8    Period Weeks    Status On-going      PT LONG TERM GOAL #5   Title Patient will report ability to perform ADLs and recreational activities with left knee pain less than or equal to 2/10.    Time 8    Period Weeks    Status On-going                 Plan - 02/09/20 0939    Clinical Impression Statement Patient presented in clinic with reports of greater pain due to abrasion/rash from top strap of hinge brace. Patient reports using OTC cream for treatment but said that it burns badly. He has contacted PCP regarding the pain and advice on further treatment. Patient was able to tolerate ROM on Nustep well for greater period of time to progress ROM. Patient able to demonstrate min to mod quad activation during QS, SAQ. L knee flexion measured as 84 deg on heel slides. Patient permitted for brace to be unlocked to greater avaliable range. Hinge brace was unlocked medially to 80 deg, laterally to 60 deg. PTA unlocked lateral side to match medial side to 80 deg. Normal vasopneumatic response noted following removal of the modality.    Personal Factors and Comorbidities Comorbidity 2    Comorbidities L patella ORIF 12/11/2019, hypothyroidism    Examination-Activity Limitations Squat;Stand;Locomotion Level;Bend    Stability/Clinical Decision Making Stable/Uncomplicated    Rehab Potential Good    PT Frequency 2x / week  PT  Duration 8 weeks    PT Treatment/Interventions ADLs/Self Care Home Management;Cryotherapy;Electrical Stimulation;Moist Heat;Iontophoresis 4mg /ml Dexamethasone;Gait training;Stair training;Functional mobility training;Therapeutic activities;Therapeutic exercise;Balance training;Manual techniques;Passive range of motion;Patient/family education;Vasopneumatic Device;Taping    PT Next Visit Plan nustep, PROM for knee flexion and extension, gait training, modalities PRN for pain relief.    PT Home Exercise Plan see patient education section    Consulted and Agree with Plan of Care Patient           Patient will benefit from skilled therapeutic intervention in order to improve the following deficits and impairments:  Abnormal gait,Decreased range of motion,Difficulty walking,Decreased activity tolerance,Decreased balance,Decreased strength,Increased edema,Pain  Visit Diagnosis: Stiffness of left knee, not elsewhere classified  Acute pain of left knee  Localized edema  Muscle weakness (generalized)     Problem List Patient Active Problem List   Diagnosis Date Noted  . Left patella fracture 12/10/2019  . Overweight (BMI 25.0-29.9) 09/20/2017  . Hypothyroidism following radioiodine therapy 03/11/2015  . Urinary frequency 12/29/2014  . Thoracic aorta atherosclerosis (HCC) 04/13/2014    04/15/2014, PTA 02/09/2020, 9:46 AM  [facility] [address] [place], [place], [place] Phone: [phone]   Fax:  [phone]  Name: Robert [name] MRN: [name] Date of Birth: [birth year]

## 2020-02-10 NOTE — Telephone Encounter (Signed)
Can you send me pictures or come in and show me, there are different types of rashes that could develop from including irritation or pressure sores or bacterial infection or yeast infection, would have to look at it to be able to discern what type of cream or medicine would help him.

## 2020-02-10 NOTE — Telephone Encounter (Signed)
Pt has appt

## 2020-02-11 ENCOUNTER — Encounter: Payer: Self-pay | Admitting: Family Medicine

## 2020-02-11 ENCOUNTER — Ambulatory Visit: Payer: Medicare HMO | Admitting: Physical Therapy

## 2020-02-11 ENCOUNTER — Ambulatory Visit (INDEPENDENT_AMBULATORY_CARE_PROVIDER_SITE_OTHER): Payer: Medicare HMO | Admitting: Family Medicine

## 2020-02-11 ENCOUNTER — Other Ambulatory Visit: Payer: Self-pay

## 2020-02-11 ENCOUNTER — Encounter: Payer: Self-pay | Admitting: Physical Therapy

## 2020-02-11 VITALS — BP 124/70 | HR 80 | Temp 97.1°F | Resp 20 | Ht 70.0 in | Wt 194.5 lb

## 2020-02-11 DIAGNOSIS — L249 Irritant contact dermatitis, unspecified cause: Secondary | ICD-10-CM

## 2020-02-11 DIAGNOSIS — M25662 Stiffness of left knee, not elsewhere classified: Secondary | ICD-10-CM

## 2020-02-11 DIAGNOSIS — K5901 Slow transit constipation: Secondary | ICD-10-CM | POA: Diagnosis not present

## 2020-02-11 DIAGNOSIS — R600 Localized edema: Secondary | ICD-10-CM

## 2020-02-11 DIAGNOSIS — M25562 Pain in left knee: Secondary | ICD-10-CM

## 2020-02-11 DIAGNOSIS — M6281 Muscle weakness (generalized): Secondary | ICD-10-CM

## 2020-02-11 MED ORDER — BETAMETHASONE SOD PHOS & ACET 6 (3-3) MG/ML IJ SUSP
6.0000 mg | Freq: Once | INTRAMUSCULAR | Status: AC
  Administered 2020-02-11: 6 mg via INTRAMUSCULAR

## 2020-02-11 NOTE — Therapy (Signed)
[facility] [address] [place], [place], [address] Phone: [phone]   Fax:  [phone]  Physical Therapy Treatment  Patient Details  Name: Robert [name] MRN: [id number] Date of Birth: [birth year] Referring Provider (PT): Ulyses Southward, PA-C   Encounter Date: 02/11/2020   PT End of Session - 02/11/20 0954    Visit Number 4    Number of Visits 18    Date for PT Re-Evaluation 04/04/20    Authorization Type [employer] Medicare (CQ modifier, KX modifier after 15th visit) FOTO; PROGRESS NOTE EVERY 10TH VISIT    PT Start Time 0945    PT Stop Time 1035    PT Time Calculation (min) 50 min    Equipment Utilized During Treatment Left knee immobilizer    Activity Tolerance Patient tolerated treatment well    Behavior During Therapy WFL for tasks assessed/performed           Past Medical History:  Diagnosis Date  . Thyroid disease     Past Surgical History:  Procedure Laterality Date  . COLONOSCOPY  1980's   in [facility]  . HERNIA REPAIR  03/2014   Umbilical  . NO PAST SURGERIES    . ORIF PATELLA Left 12/11/2019   Procedure: OPEN REDUCTION INTERNAL (ORIF) FIXATION PATELLA;  Surgeon: Roby Lofts, MD;  Location: [facility] OR;  Service: Orthopedics;  Laterality: Left;    There were no vitals filed for this visit.   Subjective Assessment - 02/11/20 0953    Subjective COVID 19 screening performed on patient upon arrival. Patient is to see PCP for rash on medial thigh. 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 Yes    Pain Score 4     Pain Location Knee    Pain Orientation Left;Medial    Pain Descriptors / Indicators Sharp    Pain Type Acute pain;Surgical pain    Pain Onset More  than a month ago    Pain Frequency Constant              [facility] PT Assessment - 02/11/20 0001      Assessment   Medical Diagnosis L patella fracture, S/P ORIF    Referring Provider (PT) Ulyses Southward, PA-C    Onset Date/Surgical Date 12/11/19    Next MD Visit 03/01/2020    Prior Therapy no      Precautions   Precautions Other (comment)    Precaution Comments Start at 60 deg knee flexion in hing brace and increase as tolerated, okay to unlock brace while ambulating per referral    Required Braces or Orthoses Knee Immobilizer - Left                         [facility] Adult PT Treatment/Exercise - 02/11/20 0001      Knee/Hip Exercises: Aerobic   Nustep L3, seat 12-10 x16 min for ROM      Knee/Hip Exercises: Seated   Long Arc Quad AROM;Left;2 sets;10 reps    Long Arc Quad Limitations 3" hold      Modalities   Modalities Vasopneumatic      Vasopneumatic   Number Minutes Vasopneumatic  10 minutes    Vasopnuematic Location  Knee  Vasopneumatic Pressure Medium    Vasopneumatic Temperature  34 for edema      Manual Therapy   Manual Therapy Passive ROM;Joint mobilization    Joint Mobilization patella mobilizations into all planes to improve ROM, gentle distraction to improve ROM in sitting    Passive ROM PROM of L knee into flexion and extension to improve ROM                       PT Long Term Goals - 02/04/20 1050      PT LONG TERM GOAL #1   Title Patient will be independent with HEP    Time 8    Period Weeks    Status Achieved      PT LONG TERM GOAL #2   Title Patient will demonstrate 115+ degrees of left knee flexion AROM to improve ability to perform functional tasks.    Time 8    Period Weeks    Status On-going      PT LONG TERM GOAL #3   Title Patient will demonstrate 5 degrees or less of left knee extension AROM to improve gait mechanics.    Time 8    Period Weeks    Status On-going      PT LONG TERM GOAL #4   Title Patient will  ambulate community distances with minimal gait deviations and left knee pain less than or equal to 2/10.    Time 8    Period Weeks    Status On-going      PT LONG TERM GOAL #5   Title Patient will report ability to perform ADLs and recreational activities with left knee pain less than or equal to 2/10.    Time 8    Period Weeks    Status On-going                 Plan - 02/11/20 1136    Clinical Impression Statement Patient arrives with ongoing left medial knee pain but was able to complete treatment with no reports of increased pain. Nustep seat increased every 5 mins for increased knee flexion with good response. PROM provided in sitting with good response and firm end feel. Patient and Pt discussed continuing HEP and to emphasize terminal knee extension during gait as he continues to demonstrate knee flexion during stance. No adverse affects upon removal of vasopneumatic device.    Personal Factors and Comorbidities Comorbidity 2    Comorbidities L patella ORIF 12/11/2019, hypothyroidism    Examination-Activity Limitations Squat;Stand;Locomotion Level;Bend    Stability/Clinical Decision Making Stable/Uncomplicated    Clinical Decision Making Low    Rehab Potential Good    PT Frequency 2x / week    PT Duration 8 weeks    PT Treatment/Interventions ADLs/Self Care Home Management;Cryotherapy;Electrical Stimulation;Moist Heat;Iontophoresis 4mg /ml Dexamethasone;Gait training;Stair training;Functional mobility training;Therapeutic activities;Therapeutic exercise;Balance training;Manual techniques;Passive range of motion;Patient/family education;Vasopneumatic Device;Taping    PT Next Visit Plan nustep, PROM for knee flexion and extension, gait training, modalities PRN for pain relief.    PT Home Exercise Plan see patient education section    Consulted and Agree with Plan of Care Patient           Patient will benefit from skilled therapeutic intervention in order to improve the  following deficits and impairments:  Abnormal gait,Decreased range of motion,Difficulty walking,Decreased activity tolerance,Decreased balance,Decreased strength,Increased edema,Pain  Visit Diagnosis: Stiffness of left knee, not elsewhere classified  Acute pain of left knee  Localized edema  Muscle  weakness (generalized)     Problem List Patient Active Problem List   Diagnosis Date Noted  . Left patella fracture 12/10/2019  . Overweight (BMI 25.0-29.9) 09/20/2017  . Hypothyroidism following radioiodine therapy 03/11/2015  . Urinary frequency 12/29/2014  . Thoracic aorta atherosclerosis (HCC) 04/13/2014    Guss Bunde, PT, DPT 02/11/2020, 11:41 AM  [facility] [address] [place], [place], [address] Phone: [phone]   Fax:  [phone]  Name: Robert [name] MRN: [id number] Date of Birth: Nov 13, 1955

## 2020-02-14 ENCOUNTER — Encounter: Payer: Self-pay | Admitting: Family Medicine

## 2020-02-14 NOTE — Progress Notes (Signed)
Chief Complaint  Patient presents with  . Rash on leg    HPI  Patient presents today for pruritic eruption on leg. Wearing a left knee brace when it started under the brace. Now stable. Wearing brace outside of his  Pants now.  PMH: Smoking status noted ROS: Per HPI  Objective: BP 124/70   Pulse 80   Temp (!) 97.1 F (36.2 C) (Temporal)   Resp 20   Ht 5\' 10"  (1.778 m)   Wt 194 lb 8 oz (88.2 kg)   SpO2 98%   BMI 27.91 kg/m  Gen: NAD, alert, cooperative with exam HEENT: NCAT, EOMI, PERRL CV: RRR, good S1/S2, no murmur Resp: CTABL, no wheezes, non-labored Abd: SNTND, BS present, no guarding or organomegaly. There is increased tone thoughout  Ext: No edema, warm Neuro: Alert and oriented, No gross deficits  Assessment and plan:  1. Irritant contact dermatitis, unspecified trigger   2. Slow transit constipation     Meds ordered this encounter  Medications  . betamethasone acetate-betamethasone sodium phosphate (CELESTONE) injection 6 mg    OTC miralax BID until sx of constipation  clear  Follow up as needed.  , MD

## 2020-02-15 ENCOUNTER — Telehealth: Payer: Self-pay

## 2020-02-15 ENCOUNTER — Other Ambulatory Visit: Payer: Self-pay | Admitting: Family Medicine

## 2020-02-15 MED ORDER — PREDNISONE 10 MG PO TABS: ORAL_TABLET | ORAL | 0 refills | Status: DC

## 2020-02-15 NOTE — Telephone Encounter (Signed)
Patient seen on Thursday for rash and given Celestone injection.  Rash has not improved.  He is requesting that we send in oral Prednisone to [facility].  Please advise.

## 2020-02-15 NOTE — Telephone Encounter (Signed)
Please let the patient know that I sent their prescription to their pharmacy. Thanks, WS 

## 2020-02-15 NOTE — Telephone Encounter (Signed)
Patient aware.

## 2020-02-16 ENCOUNTER — Other Ambulatory Visit: Payer: Self-pay

## 2020-02-16 ENCOUNTER — Ambulatory Visit: Payer: Medicare HMO | Admitting: Physical Therapy

## 2020-02-16 ENCOUNTER — Encounter: Payer: Self-pay | Admitting: Physical Therapy

## 2020-02-16 DIAGNOSIS — M6281 Muscle weakness (generalized): Secondary | ICD-10-CM | POA: Diagnosis not present

## 2020-02-16 DIAGNOSIS — M25662 Stiffness of left knee, not elsewhere classified: Secondary | ICD-10-CM

## 2020-02-16 DIAGNOSIS — M25562 Pain in left knee: Secondary | ICD-10-CM

## 2020-02-16 DIAGNOSIS — R600 Localized edema: Secondary | ICD-10-CM

## 2020-02-16 NOTE — Therapy (Signed)
[facility] [address] [place], [place], [address] Phone: [phone]   Fax:  [phone]  Physical Therapy Treatment  Patient Details  Name: Robert [name] MRN: [id number] Date of Birth: [birth year] Referring Provider (PT): Ulyses Southward, PA-C   Encounter Date: 02/16/2020   PT End of Session - 02/16/20 1008    Visit Number 5    Number of Visits 18    Date for PT Re-Evaluation 04/04/20    Authorization Type [employer] Medicare (CQ modifier, KX modifier after 15th visit) FOTO; PROGRESS NOTE EVERY 10TH VISIT    PT Start Time 0945    PT Stop Time 1030    PT Time Calculation (min) 45 min    Equipment Utilized During Treatment Left knee immobilizer    Activity Tolerance Patient tolerated treatment well    Behavior During Therapy WFL for tasks assessed/performed           Past Medical History:  Diagnosis Date  . Thyroid disease     Past Surgical History:  Procedure Laterality Date  . COLONOSCOPY  1980's   in [facility]  . HERNIA REPAIR  03/2014   Umbilical  . NO PAST SURGERIES    . ORIF PATELLA Left 12/11/2019   Procedure: OPEN REDUCTION INTERNAL (ORIF) FIXATION PATELLA;  Surgeon: Roby Lofts, MD;  Location: [facility] OR;  Service: Orthopedics;  Laterality: Left;    There were no vitals filed for this visit.   Subjective Assessment - 02/16/20 1006    Subjective COVID 19 screening performed on patient upon arrival. Patient reports that rash is improving after ointment and steroid cream from PCP.    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 Yes    Pain Score 4     Pain Location Knee    Pain Orientation Left    Pain Descriptors / Indicators Discomfort    Pain Type Surgical pain    Pain Onset More than a month ago     Pain Frequency Constant              [facility] PT Assessment - 02/16/20 0001      Assessment   Medical Diagnosis L patella fracture, S/P ORIF    Referring Provider (PT) Ulyses Southward, PA-C    Onset Date/Surgical Date 12/11/19    Next MD Visit 03/01/2020    Prior Therapy no      Precautions   Precautions Other (comment)    Precaution Comments Start at 60 deg knee flexion in hing brace and increase as tolerated, okay to unlock brace while ambulating per referral    Required Braces or Orthoses Knee Immobilizer - Left    Knee Immobilizer - Left On when out of bed or walking      ROM / Strength   AROM / PROM / Strength AROM      AROM   Overall AROM  Deficits    AROM Assessment Site Knee    Right/Left Knee Left    Left Knee Extension 9    Left Knee Flexion 89                         [facility] Adult PT Treatment/Exercise - 02/16/20 0001      Knee/Hip Exercises: Aerobic   Nustep  L1, seat 12-10 x15 min      Knee/Hip Exercises: Seated   Long Arc Quad AROM;Left;2 sets;10 reps    Long [employer] Limitations 3" hold      Knee/Hip Exercises: Supine   Short Arc Quad Sets Strengthening;Left;2 sets;10 reps    Short Arc Quad Sets Limitations 3' sec hold    Heel Slides AAROM;Left;20 reps    Heel Slides Limitations 89 deg with knee slider    Heel Prop for Knee Extension 3 minutes    Heel Prop for Knee Extension Weight (lbs) 3      Modalities   Modalities Vasopneumatic      Vasopneumatic   Number Minutes Vasopneumatic  10 minutes    Vasopnuematic Location  Knee    Vasopneumatic Pressure Medium    Vasopneumatic Temperature  34 for edema                       PT Long Term Goals - 02/04/20 1050      PT LONG TERM GOAL #1   Title Patient will be independent with HEP    Time 8    Period Weeks    Status Achieved      PT LONG TERM GOAL #2   Title Patient will demonstrate 115+ degrees of left knee flexion AROM to improve ability to perform functional tasks.     Time 8    Period Weeks    Status On-going      PT LONG TERM GOAL #3   Title Patient will demonstrate 5 degrees or less of left knee extension AROM to improve gait mechanics.    Time 8    Period Weeks    Status On-going      PT LONG TERM GOAL #4   Title Patient will ambulate community distances with minimal gait deviations and left knee pain less than or equal to 2/10.    Time 8    Period Weeks    Status On-going      PT LONG TERM GOAL #5   Title Patient will report ability to perform ADLs and recreational activities with left knee pain less than or equal to 2/10.    Time 8    Period Weeks    Status On-going                 Plan - 02/16/20 1044    Clinical Impression Statement Patient presented in clinic with moderate pain of L knee. Rash along medial thigh improving following prescribed medications from PCP. Improved L quad activation noted and improvement with L knee flexion. Patient feeling more comfortable as brace was unlocked last week. Patient more comfortable in sitting positions with knee brace. Improved patella mobility noted in med/lat but lacking in superior/inferior directions upon assessment. Normal vasopneumatic response noted following removal of the modality.    Personal Factors and Comorbidities Comorbidity 2    Comorbidities L patella ORIF 12/11/2019, hypothyroidism    Examination-Activity Limitations Squat;Stand;Locomotion Level;Bend    Stability/Clinical Decision Making Stable/Uncomplicated    Rehab Potential Good    PT Frequency 2x / week    PT Duration 8 weeks    PT Treatment/Interventions ADLs/Self Care Home Management;Cryotherapy;Electrical Stimulation;Moist Heat;Iontophoresis 4mg /ml Dexamethasone;Gait training;Stair training;Functional mobility training;Therapeutic activities;Therapeutic exercise;Balance training;Manual techniques;Passive range of motion;Patient/family education;Vasopneumatic Device;Taping    PT Next Visit Plan nustep, PROM for  knee flexion and extension, gait training, modalities PRN for pain relief.    PT Home Exercise Plan see patient education  section    Consulted and Agree with Plan of Care Patient           Patient will benefit from skilled therapeutic intervention in order to improve the following deficits and impairments:  Abnormal gait,Decreased range of motion,Difficulty walking,Decreased activity tolerance,Decreased balance,Decreased strength,Increased edema,Pain  Visit Diagnosis: Stiffness of left knee, not elsewhere classified  Acute pain of left knee  Localized edema  Muscle weakness (generalized)     Problem List Patient Active Problem List   Diagnosis Date Noted  . Left patella fracture 12/10/2019  . Overweight (BMI 25.0-29.9) 09/20/2017  . Hypothyroidism following radioiodine therapy 03/11/2015  . Urinary frequency 12/29/2014  . Thoracic aorta atherosclerosis (HCC) 04/13/2014    Marvell Fuller, PTA 02/16/2020, 11:25 AM  [facility] [address] [place], [place], [address] Phone: [phone]   Fax:  [phone]  Name: Robert [name] MRN: [id number] Date of Birth: Apr 27, 1955

## 2020-02-18 ENCOUNTER — Other Ambulatory Visit: Payer: Self-pay

## 2020-02-18 ENCOUNTER — Ambulatory Visit: Payer: Medicare HMO | Admitting: Physical Therapy

## 2020-02-18 ENCOUNTER — Encounter: Payer: Self-pay | Admitting: Physical Therapy

## 2020-02-18 DIAGNOSIS — R600 Localized edema: Secondary | ICD-10-CM | POA: Diagnosis not present

## 2020-02-18 DIAGNOSIS — M6281 Muscle weakness (generalized): Secondary | ICD-10-CM

## 2020-02-18 DIAGNOSIS — M25662 Stiffness of left knee, not elsewhere classified: Secondary | ICD-10-CM | POA: Diagnosis not present

## 2020-02-18 DIAGNOSIS — M25562 Pain in left knee: Secondary | ICD-10-CM | POA: Diagnosis not present

## 2020-02-18 NOTE — Therapy (Signed)
[facility] [address] [place], [place], [address] Phone: [phone]   Fax:  [phone]  Physical Therapy Treatment  Patient Details  Name: Robert [name] MRN: [id number] Date of Birth: Jan 16, 1956 Referring Provider (PT): Ulyses Southward, PA-C   Encounter Date: 02/18/2020   PT End of Session - 02/18/20 1002    Visit Number 6    Number of Visits 18    Date for PT Re-Evaluation 04/04/20    Authorization Type [employer] Medicare (CQ modifier, KX modifier after 15th visit) FOTO; PROGRESS NOTE EVERY 10TH VISIT    PT Start Time 0945    PT Stop Time 1033    PT Time Calculation (min) 48 min    Equipment Utilized During Treatment Left knee immobilizer    Activity Tolerance Patient tolerated treatment well    Behavior During Therapy WFL for tasks assessed/performed           Past Medical History:  Diagnosis Date  . Thyroid disease     Past Surgical History:  Procedure Laterality Date  . COLONOSCOPY  1980's   in [facility]  . HERNIA REPAIR  03/2014   Umbilical  . NO PAST SURGERIES    . ORIF PATELLA Left 12/11/2019   Procedure: OPEN REDUCTION INTERNAL (ORIF) FIXATION PATELLA;  Surgeon: Roby Lofts, MD;  Location: [facility] OR;  Service: Orthopedics;  Laterality: Left;    There were no vitals filed for this visit.   Subjective Assessment - 02/18/20 0958    Subjective COVID 19 screening performed on patient upon arrival. Patient doing well, a little sore after last session but doing well.    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 Yes    Pain Score 3    "3 1/2"   Pain Location Knee    Pain Orientation Left    Pain Descriptors / Indicators Sore    Pain Type Surgical pain    Pain Onset More than a month ago    Pain  Frequency Constant              [facility] PT Assessment - 02/18/20 0001      Assessment   Medical Diagnosis L patella fracture, S/P ORIF    Referring Provider (PT) Ulyses Southward, PA-C    Onset Date/Surgical Date 12/11/19    Next MD Visit 03/01/2020      Precautions   Precautions Other (comment)    Precaution Comments Start at 60 deg knee flexion in hing brace and increase as tolerated, okay to unlock brace while ambulating per referral    Required Braces or Orthoses Knee Immobilizer - Left    Knee Immobilizer - Left On when out of bed or walking                         [facility] Adult PT Treatment/Exercise - 02/18/20 0001      Knee/Hip Exercises: Aerobic   Nustep L3, seat 10-8 x13 min      Knee/Hip Exercises: Seated   Long Arc Quad AROM;Left;3 sets;10 reps    Long Arc Quad Limitations 3" hold    Hamstring Curl Strengthening;2 sets;10 reps    Hamstring Limitations red      Modalities   Modalities Vasopneumatic  Vasopneumatic   Number Minutes Vasopneumatic  10 minutes    Vasopnuematic Location  Knee    Vasopneumatic Pressure Medium    Vasopneumatic Temperature  34 for edema      Manual Therapy   Manual Therapy Passive ROM;Joint mobilization;Soft tissue mobilization    Joint Mobilization patella mobilizations into all planes to improve ROM, gentle distraction to improve ROM in sitting    Soft tissue mobilization STW/M to R quad with PROM into flexion    Passive ROM PROM of L knee into flexion and extension to improve ROM                       PT Long Term Goals - 02/04/20 1050      PT LONG TERM GOAL #1   Title Patient will be independent with HEP    Time 8    Period Weeks    Status Achieved      PT LONG TERM GOAL #2   Title Patient will demonstrate 115+ degrees of left knee flexion AROM to improve ability to perform functional tasks.    Time 8    Period Weeks    Status On-going      PT LONG TERM GOAL #3   Title Patient will  demonstrate 5 degrees or less of left knee extension AROM to improve gait mechanics.    Time 8    Period Weeks    Status On-going      PT LONG TERM GOAL #4   Title Patient will ambulate community distances with minimal gait deviations and left knee pain less than or equal to 2/10.    Time 8    Period Weeks    Status On-going      PT LONG TERM GOAL #5   Title Patient will report ability to perform ADLs and recreational activities with left knee pain less than or equal to 2/10.    Time 8    Period Weeks    Status On-going                 Plan - 02/18/20 1058    Clinical Impression Statement Patient responded well to therapy session with emphasis on patella mobilization and PROM. STW/M provided to L quad with slight reports of pain but overall was able to complete. Patient noted with audible crepitus with end range flexion PROM but no reports of increased pain. Pain reported as 2/10 upon competion of vasopneumatic device.    Personal Factors and Comorbidities Comorbidity 2    Comorbidities L patella ORIF 12/11/2019, hypothyroidism    Examination-Activity Limitations Squat;Stand;Locomotion Level;Bend    Stability/Clinical Decision Making Stable/Uncomplicated    Clinical Decision Making Low    Rehab Potential Good    PT Frequency 2x / week    PT Duration 8 weeks    PT Treatment/Interventions ADLs/Self Care Home Management;Cryotherapy;Electrical Stimulation;Moist Heat;Iontophoresis 4mg /ml Dexamethasone;Gait training;Stair training;Functional mobility training;Therapeutic activities;Therapeutic exercise;Balance training;Manual techniques;Passive range of motion;Patient/family education;Vasopneumatic Device;Taping    PT Next Visit Plan nustep, PROM for knee flexion and extension, gait training, modalities PRN for pain relief.    PT Home Exercise Plan see patient education section    Consulted and Agree with Plan of Care Patient           Patient will benefit from skilled  therapeutic intervention in order to improve the following deficits and impairments:  Abnormal gait,Decreased range of motion,Difficulty walking,Decreased activity tolerance,Decreased balance,Decreased strength,Increased edema,Pain  Visit Diagnosis: Stiffness of left knee,  not elsewhere classified  Acute pain of left knee  Localized edema  Muscle weakness (generalized)     Problem List Patient Active Problem List   Diagnosis Date Noted  . Left patella fracture 12/10/2019  . Overweight (BMI 25.0-29.9) 09/20/2017  . Hypothyroidism following radioiodine therapy 03/11/2015  . Urinary frequency 12/29/2014  . Thoracic aorta atherosclerosis (HCC) 04/13/2014    Guss Bunde, PT, DPT 02/18/2020, 11:00 AM  [facility] [address] [place], [place], [address] Phone: [phone]   Fax:  [phone]  Name: Manus Weedman MRN: [id number] Date of Birth: 09/26/1955

## 2020-02-23 ENCOUNTER — Ambulatory Visit: Payer: Medicare HMO | Attending: Student | Admitting: Physical Therapy

## 2020-02-23 ENCOUNTER — Encounter: Payer: Self-pay | Admitting: Physical Therapy

## 2020-02-23 ENCOUNTER — Other Ambulatory Visit: Payer: Self-pay

## 2020-02-23 DIAGNOSIS — M6281 Muscle weakness (generalized): Secondary | ICD-10-CM | POA: Diagnosis not present

## 2020-02-23 DIAGNOSIS — M25562 Pain in left knee: Secondary | ICD-10-CM | POA: Diagnosis not present

## 2020-02-23 DIAGNOSIS — R600 Localized edema: Secondary | ICD-10-CM | POA: Diagnosis not present

## 2020-02-23 DIAGNOSIS — M25662 Stiffness of left knee, not elsewhere classified: Secondary | ICD-10-CM | POA: Diagnosis not present

## 2020-02-23 NOTE — Therapy (Signed)
[facility] [address] [place], [place], [address] Phone: [phone]   Fax:  [phone]  Physical Therapy Treatment  Patient Details  Name: Robert [name] MRN: [id number] Date of Birth: [birth year] Referring Provider (PT): Ulyses Southward, PA-C   Encounter Date: 02/23/2020   PT End of Session - 02/23/20 0957    Visit Number 7    Number of Visits 18    Date for PT Re-Evaluation 04/04/20    Authorization Type [employer] Medicare (CQ modifier, KX modifier after 15th visit) FOTO; PROGRESS NOTE EVERY 10TH VISIT    PT Start Time [phone]    PT Stop Time 1034    PT Time Calculation (min) 47 min    Equipment Utilized During Treatment Left knee immobilizer    Activity Tolerance Patient tolerated treatment well    Behavior During Therapy [facility] for tasks assessed/performed           Past Medical History:  Diagnosis Date  . Thyroid disease     Past Surgical History:  Procedure Laterality Date  . COLONOSCOPY  1980's   in [facility]  . HERNIA REPAIR  03/2014   Umbilical  . NO PAST SURGERIES    . ORIF PATELLA Left 12/11/2019   Procedure: OPEN REDUCTION INTERNAL (ORIF) FIXATION PATELLA;  Surgeon: Roby Lofts, MD;  Location: [facility] OR;  Service: Orthopedics;  Laterality: Left;    There were no vitals filed for this visit.   Subjective Assessment - 02/23/20 0956    Subjective COVID 19 screening performed on patient upon arrival. Patient doing well, a little sore and may go to the gym to get in the sauna when he leaves PT.    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 Yes    Pain Score 3     Pain Location Knee    Pain Orientation Left    Pain Descriptors / Indicators Sore    Pain Type Surgical pain    Pain Onset More than a month  ago    Pain Frequency Constant              [facility] PT Assessment - 02/23/20 0001      Assessment   Medical Diagnosis L patella fracture, S/P ORIF    Referring Provider (PT) Ulyses Southward, PA-C    Onset Date/Surgical Date 12/11/19    Next MD Visit 03/01/2020    Prior Therapy no      Precautions   Precautions Other (comment)    Precaution Comments Start at 60 deg knee flexion in hing brace and increase as tolerated, okay to unlock brace while ambulating per referral    Required Braces or Orthoses Knee Immobilizer - Left    Knee Immobilizer - Left On when out of bed or walking      ROM / Strength   AROM / PROM / Strength AROM      AROM   Overall AROM  Deficits    AROM Assessment Site Knee    Right/Left Knee Left    Left Knee Extension 8                         [facility] Adult PT Treatment/Exercise - 02/23/20 0001      Knee/Hip Exercises: Stretches  ITB Stretch Left;3 reps;30 seconds      Knee/Hip Exercises: Aerobic   Nustep L3, seat 10-8 x16 min      Knee/Hip Exercises: Standing   Heel Raises Both;20 reps    Heel Raises Limitations B toe raise x20 reps    Forward Lunges Left;20 reps;3 seconds   8" step   Rocker Board 3 minutes      Knee/Hip Exercises: Supine   Short Arc Quad Sets Strengthening;Left;20 reps;Limitations    Short Arc Quad Sets Limitations 2#    Heel Prop for Knee Extension 3 minutes    Heel Prop for Knee Extension Weight (lbs) 5      Modalities   Modalities Vasopneumatic      Vasopneumatic   Number Minutes Vasopneumatic  10 minutes    Vasopnuematic Location  Knee    Vasopneumatic Pressure Medium    Vasopneumatic Temperature  34 for edema      Manual Therapy   Manual Therapy Passive ROM    Passive ROM PROM of L knee into flexion and extension to improve ROM                       PT Long Term Goals - 02/04/20 1050      PT LONG TERM GOAL #1   Title Patient will be independent with HEP    Time 8    Period Weeks     Status Achieved      PT LONG TERM GOAL #2   Title Patient will demonstrate 115+ degrees of left knee flexion AROM to improve ability to perform functional tasks.    Time 8    Period Weeks    Status On-going      PT LONG TERM GOAL #3   Title Patient will demonstrate 5 degrees or less of left knee extension AROM to improve gait mechanics.    Time 8    Period Weeks    Status On-going      PT LONG TERM GOAL #4   Title Patient will ambulate community distances with minimal gait deviations and left knee pain less than or equal to 2/10.    Time 8    Period Weeks    Status On-going      PT LONG TERM GOAL #5   Title Patient will report ability to perform ADLs and recreational activities with left knee pain less than or equal to 2/10.    Time 8    Period Weeks    Status On-going                 Plan - 02/23/20 1026    Clinical Impression Statement Patient presented in clinic with reports of greater L knee soreness. Patient guided through more ROM/stretching to improve flexibility and ROM. Patient reports intermittant tightness of distal L quad with mod tightness palpable. Tone palpable in L ITB as well with appropriate stretch response. Patient observed standing statically with L knee flexion and L knee flexion noted swing phase of gait as well. Tightness of L distal quad reported during PROM into knee flexion. Normal vasopneumatic response noted following removal of the modality.    Personal Factors and Comorbidities Comorbidity 2    Comorbidities L patella ORIF 12/11/2019, hypothyroidism    Examination-Activity Limitations Squat;Stand;Locomotion Level;Bend    Stability/Clinical Decision Making Stable/Uncomplicated    Rehab Potential Good    PT Frequency 2x / week    PT Duration 8 weeks    PT Treatment/Interventions  ADLs/Self Care Home Management;Cryotherapy;Electrical Stimulation;Moist Heat;Iontophoresis 4mg /ml Dexamethasone;Gait training;Stair training;Functional mobility  training;Therapeutic activities;Therapeutic exercise;Balance training;Manual techniques;Passive range of motion;Patient/family education;Vasopneumatic Device;Taping    PT Next Visit Plan nustep, PROM for knee flexion and extension, gait training, modalities PRN for pain relief.    PT Home Exercise Plan see patient education section    Consulted and Agree with Plan of Care Patient           Patient will benefit from skilled therapeutic intervention in order to improve the following deficits and impairments:  Abnormal gait,Decreased range of motion,Difficulty walking,Decreased activity tolerance,Decreased balance,Decreased strength,Increased edema,Pain  Visit Diagnosis: Stiffness of left knee, not elsewhere classified  Acute pain of left knee  Localized edema  Muscle weakness (generalized)     Problem List Patient Active Problem List   Diagnosis Date Noted  . Left patella fracture 12/10/2019  . Overweight (BMI 25.0-29.9) 09/20/2017  . Hypothyroidism following radioiodine therapy 03/11/2015  . Urinary frequency 12/29/2014  . Thoracic aorta atherosclerosis (HCC) 04/13/2014    04/15/2014, PTA 02/23/2020, 10:39 AM  [facility] [address] [place], [place], [place] Phone: [phone]   Fax:  [phone]  Name: Robert [name] MRN: [name] Date of Birth: [birth year]

## 2020-02-25 ENCOUNTER — Encounter: Payer: Self-pay | Admitting: Physical Therapy

## 2020-02-25 ENCOUNTER — Other Ambulatory Visit: Payer: Self-pay

## 2020-02-25 ENCOUNTER — Ambulatory Visit: Payer: Medicare HMO | Admitting: Physical Therapy

## 2020-02-25 DIAGNOSIS — R600 Localized edema: Secondary | ICD-10-CM

## 2020-02-25 DIAGNOSIS — M25662 Stiffness of left knee, not elsewhere classified: Secondary | ICD-10-CM | POA: Diagnosis not present

## 2020-02-25 DIAGNOSIS — M6281 Muscle weakness (generalized): Secondary | ICD-10-CM

## 2020-02-25 DIAGNOSIS — M25562 Pain in left knee: Secondary | ICD-10-CM

## 2020-02-25 NOTE — Therapy (Signed)
[facility] [address] [place], [place], [address] Phone: [phone]   Fax:  [phone]  Physical Therapy Treatment  Patient Details  Name: Robert [name] MRN: [id number] Date of Birth: 1955-05-29 Referring Provider (PT): Ulyses Southward, PA-C   Encounter Date: 02/25/2020   PT End of Session - 02/25/20 1048    Visit Number 8    Number of Visits 18    Date for PT Re-Evaluation 04/04/20    Authorization Type [employer] Medicare (CQ modifier, KX modifier after 15th visit) FOTO; PROGRESS NOTE EVERY 10TH VISIT    PT Start Time 0945    PT Stop Time 1034    PT Time Calculation (min) 49 min    Equipment Utilized During Treatment Left knee immobilizer    Activity Tolerance Patient tolerated treatment well    Behavior During Therapy WFL for tasks assessed/performed           Past Medical History:  Diagnosis Date  . Thyroid disease     Past Surgical History:  Procedure Laterality Date  . COLONOSCOPY  1980's   in [facility]  . HERNIA REPAIR  03/2014   Umbilical  . NO PAST SURGERIES    . ORIF PATELLA Left 12/11/2019   Procedure: OPEN REDUCTION INTERNAL (ORIF) FIXATION PATELLA;  Surgeon: Roby Lofts, MD;  Location: [facility] OR;  Service: Orthopedics;  Laterality: Left;    There were no vitals filed for this visit.   Subjective Assessment - 02/25/20 1047    Subjective COVID 19 screening performed on patient upon arrival. Patient arrives with reports of more stiffness in the morning time which limits his ROM. Reports being independent with home activites.    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 Yes    Pain Score 2     Pain Location Knee    Pain Orientation Left    Pain Descriptors / Indicators Sore    Pain Type  Surgical pain    Pain Onset More than a month ago    Pain Frequency Constant              [facility] PT Assessment - 02/25/20 0001      Assessment   Medical Diagnosis L patella fracture, S/P ORIF    Referring Provider (PT) Ulyses Southward, PA-C    Onset Date/Surgical Date 12/11/19    Next MD Visit 03/01/2020    Prior Therapy no      Precautions   Precautions Other (comment)    Precaution Comments Start at 60 deg knee flexion in hing brace and increase as tolerated, okay to unlock brace while ambulating per referral    Required Braces or Orthoses Knee Immobilizer - Left    Knee Immobilizer - Left On when out of bed or walking      Restrictions   Weight Bearing Restrictions Yes    Other Position/Activity Restrictions WBAT      AROM   Left Knee Extension 7    Left Knee Flexion 88      PROM   Left Knee Extension 4    Left Knee Flexion 92                         [facility] Adult PT Treatment/Exercise - 02/25/20 0001  Knee/Hip Exercises: Aerobic   Nustep L3, seat 10-8 x15 min      Modalities   Modalities Vasopneumatic      Vasopneumatic   Number Minutes Vasopneumatic  10 minutes    Vasopnuematic Location  Knee    Vasopneumatic Pressure Medium    Vasopneumatic Temperature  34 for edema      Manual Therapy   Manual Therapy Passive ROM    Joint Mobilization patella mobilizations into all planes to improve ROM, gentle distraction to improve ROM in sitting    Soft tissue mobilization STW/M and IASTM to R quad with PROM into flexion    Passive ROM PROM of L knee into flexion and extension to improve ROM                       PT Long Term Goals - 02/25/20 1051      PT LONG TERM GOAL #1   Title Patient will be independent with HEP    Time 8    Period Weeks    Status Achieved      PT LONG TERM GOAL #2   Title Patient will demonstrate 115+ degrees of left knee flexion AROM to improve ability to perform functional tasks.    Time 8    Period Weeks     Status On-going      PT LONG TERM GOAL #3   Title Patient will demonstrate 5 degrees or less of left knee extension AROM to improve gait mechanics.    Time 8    Period Weeks    Status On-going      PT LONG TERM GOAL #4   Title Patient will ambulate community distances with minimal gait deviations and left knee pain less than or equal to 2/10.    Time 8    Period Weeks    Status On-going      PT LONG TERM GOAL #5   Title Patient will report ability to perform ADLs and recreational activities with left knee pain less than or equal to 2/10.    Time 8    Period Weeks    Status On-going                 Plan - 02/25/20 1048    Clinical Impression Statement Patient responded well to therapy session though with ongoing left knee soreness. PROM to left knee provided with reports of tightness and pain at end range. IASTM initiated with PROM with good response and notable adhesions and scar tissue along quad region. Patient's goals ongoing at this time.    Personal Factors and Comorbidities Comorbidity 2    Comorbidities L patella ORIF 12/11/2019, hypothyroidism    Examination-Activity Limitations Squat;Stand;Locomotion Level;Bend    Stability/Clinical Decision Making Stable/Uncomplicated    Clinical Decision Making Low    Rehab Potential Good    PT Frequency 2x / week    PT Duration 8 weeks    PT Treatment/Interventions ADLs/Self Care Home Management;Cryotherapy;Electrical Stimulation;Moist Heat;Iontophoresis 4mg /ml Dexamethasone;Gait training;Stair training;Functional mobility training;Therapeutic activities;Therapeutic exercise;Balance training;Manual techniques;Passive range of motion;Patient/family education;Vasopneumatic Device;Taping    PT Next Visit Plan nustep, PROM for knee flexion and extension, gait training, modalities PRN for pain relief.    PT Home Exercise Plan see patient education section    Consulted and Agree with Plan of Care Patient           Patient will  benefit from skilled therapeutic intervention in order to improve the following deficits and impairments:  Abnormal gait,Decreased range of motion,Difficulty walking,Decreased activity tolerance,Decreased balance,Decreased strength,Increased edema,Pain  Visit Diagnosis: Stiffness of left knee, not elsewhere classified  Localized edema  Acute pain of left knee  Muscle weakness (generalized)     Problem List Patient Active Problem List   Diagnosis Date Noted  . Left patella fracture 12/10/2019  . Overweight (BMI 25.0-29.9) 09/20/2017  . Hypothyroidism following radioiodine therapy 03/11/2015  . Urinary frequency 12/29/2014  . Thoracic aorta atherosclerosis (HCC) 04/13/2014    Guss Bunde, PT, DPT 02/25/2020, 10:55 AM  [facility] [address] [place], [place], [address] Phone: [phone]   Fax:  [phone]  Name: Robert [name] MRN: [id number] Date of Birth: October 14, 1955

## 2020-03-01 ENCOUNTER — Other Ambulatory Visit: Payer: Self-pay

## 2020-03-01 ENCOUNTER — Ambulatory Visit: Payer: Medicare HMO | Admitting: Physical Therapy

## 2020-03-01 ENCOUNTER — Encounter: Payer: Self-pay | Admitting: Physical Therapy

## 2020-03-01 DIAGNOSIS — M6281 Muscle weakness (generalized): Secondary | ICD-10-CM

## 2020-03-01 DIAGNOSIS — M25562 Pain in left knee: Secondary | ICD-10-CM | POA: Diagnosis not present

## 2020-03-01 DIAGNOSIS — S82002D Unspecified fracture of left patella, subsequent encounter for closed fracture with routine healing: Secondary | ICD-10-CM | POA: Diagnosis not present

## 2020-03-01 DIAGNOSIS — M25662 Stiffness of left knee, not elsewhere classified: Secondary | ICD-10-CM

## 2020-03-01 DIAGNOSIS — R600 Localized edema: Secondary | ICD-10-CM | POA: Diagnosis not present

## 2020-03-01 NOTE — Therapy (Signed)
[facility] [address] [place], [place], [address] Phone: [phone]   Fax:  [phone]  Physical Therapy Treatment  Patient Details  Name: Robert [name] MRN: [id number] Date of Birth: September 13, 1955 Referring Provider (PT): Ulyses Southward, PA-C    Encounter Date: 03/01/2020   PT End of Session - 03/01/20 0951    Visit Number 9    Number of Visits 18    Date for PT Re-Evaluation 04/04/20    Authorization Type [employer] Medicare (CQ modifier, KX modifier after 15th visit) FOTO; PROGRESS NOTE EVERY 10TH VISIT    PT Start Time 0945    PT Stop Time 1030    PT Time Calculation (min) 45 min    Equipment Utilized During Treatment Left knee immobilizer    Activity Tolerance Patient tolerated treatment well    Behavior During Therapy WFL for tasks assessed/performed           Past Medical History:  Diagnosis Date  . Thyroid disease     Past Surgical History:  Procedure Laterality Date  . COLONOSCOPY  1980's   in [facility]  . HERNIA REPAIR  03/2014   Umbilical  . NO PAST SURGERIES    . ORIF PATELLA Left 12/11/2019   Procedure: OPEN REDUCTION INTERNAL (ORIF) FIXATION PATELLA;  Surgeon: Roby Lofts, MD;  Location: [facility] OR;  Service: Orthopedics;  Laterality: Left;    There were no vitals filed for this visit.   Subjective Assessment - 03/01/20 0950    Subjective COVID 19 screening performed on patient upon arrival. Patient arrives with ongoing stiffness but feels sore to distal patella region.    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 Yes    Pain Score 3     Pain Location Knee    Pain Orientation Left    Pain Descriptors / Indicators Sore    Pain Type Surgical pain    Pain Onset More than a month ago    Pain  Frequency Constant              [facility] PT Assessment - 03/01/20 0001      Assessment   Medical Diagnosis L patella fracture, S/P ORIF    Referring Provider (PT) Ulyses Southward, PA-C    Onset Date/Surgical Date 12/11/19    Next MD Visit 03/01/2020      Precautions   Precautions Other (comment)    Precaution Comments Start at 60 deg knee flexion in hing brace and increase as tolerated, okay to unlock brace while ambulating per referral    Required Braces or Orthoses Knee Immobilizer - Left    Knee Immobilizer - Left On when out of bed or walking      Restrictions   Weight Bearing Restrictions Yes    Other Position/Activity Restrictions WBAT                         [facility] Adult PT Treatment/Exercise - 03/01/20 0001      Knee/Hip Exercises: Aerobic   Nustep L3, seat 10-7 x15 min      Modalities   Modalities Vasopneumatic      Vasopneumatic   Number Minutes Vasopneumatic  10 minutes    Vasopnuematic Location  Knee    Vasopneumatic Pressure Medium  Vasopneumatic Temperature  34 for edema      Manual Therapy   Manual Therapy Passive ROM    Joint Mobilization patella mobilizations into all planes to improve ROM, gentle distraction to improve ROM in sitting    Soft tissue mobilization STW/M and IASTM to R quad with PROM into flexion    Passive ROM PROM of L knee into flexion and extension to improve ROM                       PT Long Term Goals - 02/25/20 1051      PT LONG TERM GOAL #1   Title Patient will be independent with HEP    Time 8    Period Weeks    Status Achieved      PT LONG TERM GOAL #2   Title Patient will demonstrate 115+ degrees of left knee flexion AROM to improve ability to perform functional tasks.    Time 8    Period Weeks    Status On-going      PT LONG TERM GOAL #3   Title Patient will demonstrate 5 degrees or less of left knee extension AROM to improve gait mechanics.    Time 8    Period Weeks    Status On-going       PT LONG TERM GOAL #4   Title Patient will ambulate community distances with minimal gait deviations and left knee pain less than or equal to 2/10.    Time 8    Period Weeks    Status On-going      PT LONG TERM GOAL #5   Title Patient will report ability to perform ADLs and recreational activities with left knee pain less than or equal to 2/10.    Time 8    Period Weeks    Status On-going                 Plan - 03/01/20 1023    Clinical Impression Statement Patient arrived to physical therapy with ongoing left knee stiffness and soreness in the inferior aspect of the patella. Patient's session emphasized on ROM with good response. Decreased adhesions in quad noted with IASTM and improved patella joint mobility in all planes. Patient instructed on heel prop at home for knee extension to which patient reported understanding. No adverse affects upon removal of modalities.    Personal Factors and Comorbidities Comorbidity 2    Comorbidities L patella ORIF 12/11/2019, hypothyroidism    Examination-Activity Limitations Squat;Stand;Locomotion Level;Bend    Stability/Clinical Decision Making Stable/Uncomplicated    Clinical Decision Making Low    Rehab Potential Good    PT Frequency 2x / week    PT Duration 8 weeks    PT Treatment/Interventions ADLs/Self Care Home Management;Cryotherapy;Electrical Stimulation;Moist Heat;Iontophoresis 4mg /ml Dexamethasone;Gait training;Stair training;Functional mobility training;Therapeutic activities;Therapeutic exercise;Balance training;Manual techniques;Passive range of motion;Patient/family education;Vasopneumatic Device;Taping    PT Next Visit Plan Progress note and FOTO; attempt bike for more knee flexion ROM, nustep, PROM for knee flexion and extension, gait training, modalities PRN for pain relief.    PT Home Exercise Plan see patient education section    Consulted and Agree with Plan of Care Patient           Patient will benefit from  skilled therapeutic intervention in order to improve the following deficits and impairments:  Abnormal gait,Decreased range of motion,Difficulty walking,Decreased activity tolerance,Decreased balance,Decreased strength,Increased edema,Pain  Visit Diagnosis: Stiffness of left knee, not elsewhere classified  Localized  edema  Acute pain of left knee  Muscle weakness (generalized)     Problem List Patient Active Problem List   Diagnosis Date Noted  . Left patella fracture 12/10/2019  . Overweight (BMI 25.0-29.9) 09/20/2017  . Hypothyroidism following radioiodine therapy 03/11/2015  . Urinary frequency 12/29/2014  . Thoracic aorta atherosclerosis (HCC) 04/13/2014    Guss Bunde, PT, DPT 03/01/2020, 10:37 AM  [facility] [address] [place], [place], [address] Phone: [phone]   Fax:  [phone]  Name: Robert [name] MRN: [id number] Date of Birth: 02-23-1955

## 2020-03-03 ENCOUNTER — Other Ambulatory Visit: Payer: Self-pay

## 2020-03-03 ENCOUNTER — Encounter: Payer: Self-pay | Admitting: Physical Therapy

## 2020-03-03 ENCOUNTER — Ambulatory Visit: Payer: Medicare HMO | Admitting: Physical Therapy

## 2020-03-03 DIAGNOSIS — M25662 Stiffness of left knee, not elsewhere classified: Secondary | ICD-10-CM

## 2020-03-03 DIAGNOSIS — M25562 Pain in left knee: Secondary | ICD-10-CM | POA: Diagnosis not present

## 2020-03-03 DIAGNOSIS — M6281 Muscle weakness (generalized): Secondary | ICD-10-CM | POA: Diagnosis not present

## 2020-03-03 DIAGNOSIS — R600 Localized edema: Secondary | ICD-10-CM

## 2020-03-03 NOTE — Therapy (Signed)
[facility] [address] [place], [place], [address] Phone: [phone]   Fax:  [phone]  Physical Therapy Treatment   Progress Note Reporting Period 02/01/2020 to 03/03/2020  See note below for Objective Data and Assessment of Progress/Goals. Patient is making improvements with left knee though with ongoing stiffness.      Patient Details  Name: Robert [name] MRN: [id number] Date of Birth: 1955-11-22 Referring Provider (PT): Ulyses Southward, PA-C   Encounter Date: 03/03/2020   PT End of Session - 03/03/20 0952    Visit Number 10    Number of Visits 18    Date for PT Re-Evaluation 04/04/20    Authorization Type [employer] Medicare (CQ modifier, KX modifier after 15th visit) FOTO; PROGRESS NOTE EVERY 10TH VISIT    PT Start Time [phone]    PT Stop Time 1029    PT Time Calculation (min) 41 min    Activity Tolerance Patient tolerated treatment well    Behavior During Therapy WFL for tasks assessed/performed           Past Medical History:  Diagnosis Date  . Thyroid disease     Past Surgical History:  Procedure Laterality Date  . COLONOSCOPY  1980's   in [facility]  . HERNIA REPAIR  03/2014   Umbilical  . NO PAST SURGERIES    . ORIF PATELLA Left 12/11/2019   Procedure: OPEN REDUCTION INTERNAL (ORIF) FIXATION PATELLA;  Surgeon: Roby Lofts, MD;  Location: [facility] OR;  Service: Orthopedics;  Laterality: Left;    There were no vitals filed for this visit.   Subjective Assessment - 03/03/20 0950    Subjective COVID 19 screening performed on patient upon arrival. Patient arrives released from brace and to try to reduce scar tissue and improve knee flexion. States that MD wants him to try to walk half a mile.    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 Yes    Pain Score 1     Pain Location Knee    Pain Orientation Left    Pain Descriptors / Indicators Discomfort    Pain Type Surgical pain    Pain Onset More than a month ago    Pain Frequency Intermittent              [facility] PT Assessment - 03/03/20 0001      Assessment   Medical Diagnosis L patella fracture, S/P ORIF    Referring Provider (PT) Ulyses Southward, PA-C    Onset Date/Surgical Date 12/11/19    Next MD Visit 05/03/2020    Prior Therapy no                         [facility] Adult PT Treatment/Exercise - 03/03/20 0001      Knee/Hip Exercises: Aerobic   Tread Mill 1.3 mph, no incline x5 min for gait sequencing    Nustep L3, seat 11-9 x17 min; seat progression for ROM      Knee/Hip Exercises: Machines for Strengthening   Cybex Knee Extension 10# 3x10 reps    Cybex Knee Flexion 30# 3x10 reps    Cybex Leg Press 1.5 pl, seat 7 x20 reps for knee flexion      Manual Therapy   Manual Therapy Myofascial  release    Myofascial Release IASTW to L knee (distal quad, medial knee, ITB, patellar tendon) to reduce adhesions and tightness                       PT Long Term Goals - 02/25/20 1051      PT LONG TERM GOAL #1   Title Patient will be independent with HEP    Time 8    Period Weeks    Status Achieved      PT LONG TERM GOAL #2   Title Patient will demonstrate 115+ degrees of left knee flexion AROM to improve ability to perform functional tasks.    Time 8    Period Weeks    Status On-going      PT LONG TERM GOAL #3   Title Patient will demonstrate 5 degrees or less of left knee extension AROM to improve gait mechanics.    Time 8    Period Weeks    Status On-going      PT LONG TERM GOAL #4   Title Patient will ambulate community distances with minimal gait deviations and left knee pain less than or equal to 2/10.    Time 8    Period Weeks    Status On-going      PT LONG TERM GOAL #5   Title  Patient will report ability to perform ADLs and recreational activities with left knee pain less than or equal to 2/10.    Time 8    Period Weeks    Status On-going                 Plan - 03/03/20 1038    Clinical Impression Statement Patient presented in clinic after being released from knee brace and encouraged to return to activity such as walking for 0.5 mile. Patient progressed through more strengthening with ROM progression as well. Patient educated that remember heel strike/toe off for gait while walking for knee ROM. Patient continues to experience tightness in distal quad, ITB as well as surrounding L patella and patellar tendon. Good redness response noted throughout the IASTW session. Patient experiencing more tightness feeling in posterior L knee with mod HS tightness and palpable edema. Patient encouraged to stress heel/toe when walking. No modalities completed in clinic today.    Personal Factors and Comorbidities Comorbidity 2    Comorbidities L patella ORIF 12/11/2019, hypothyroidism    Examination-Activity Limitations Squat;Stand;Locomotion Level;Bend    Stability/Clinical Decision Making Stable/Uncomplicated    Rehab Potential Good    PT Frequency 2x / week    PT Duration 8 weeks    PT Treatment/Interventions ADLs/Self Care Home Management;Cryotherapy;Electrical Stimulation;Moist Heat;Iontophoresis 4mg /ml Dexamethasone;Gait training;Stair training;Functional mobility training;Therapeutic activities;Therapeutic exercise;Balance training;Manual techniques;Passive range of motion;Patient/family education;Vasopneumatic Device;Taping    PT Next Visit Plan FOTO at 11th visit; attempt bike for more knee flexion ROM and strengthening.    PT Home Exercise Plan see patient education section    Consulted and Agree with Plan of Care Patient           Patient will benefit from skilled therapeutic intervention in order to improve the following deficits and impairments:  Abnormal  gait,Decreased range of motion,Difficulty walking,Decreased activity tolerance,Decreased balance,Decreased strength,Increased edema,Pain  Visit Diagnosis: Stiffness of left knee, not elsewhere classified  Localized edema  Acute pain of left knee  Muscle weakness (generalized)     Problem List Patient Active Problem List   Diagnosis Date Noted  . Left patella fracture  12/10/2019  . Overweight (BMI 25.0-29.9) 09/20/2017  . Hypothyroidism following radioiodine therapy 03/11/2015  . Urinary frequency 12/29/2014  . Thoracic aorta atherosclerosis (HCC) 04/13/2014    Marvell Fuller, PTA 03/03/2020, 10:42 AM  [facility] [address] [place], [place], [address] Phone: [phone]   Fax:  [phone]  Name: Robert [name] MRN: [id number] Date of Birth: Dec 01, 1955

## 2020-03-08 ENCOUNTER — Other Ambulatory Visit: Payer: Self-pay

## 2020-03-08 ENCOUNTER — Ambulatory Visit: Payer: Medicare HMO | Admitting: Physical Therapy

## 2020-03-08 ENCOUNTER — Encounter: Payer: Self-pay | Admitting: Physical Therapy

## 2020-03-08 DIAGNOSIS — M25662 Stiffness of left knee, not elsewhere classified: Secondary | ICD-10-CM

## 2020-03-08 DIAGNOSIS — R600 Localized edema: Secondary | ICD-10-CM

## 2020-03-08 DIAGNOSIS — M6281 Muscle weakness (generalized): Secondary | ICD-10-CM | POA: Diagnosis not present

## 2020-03-08 DIAGNOSIS — M25562 Pain in left knee: Secondary | ICD-10-CM | POA: Diagnosis not present

## 2020-03-08 NOTE — Therapy (Signed)
[facility] [address] [place], [place], [address] Phone: [phone]   Fax:  [phone]  Physical Therapy Treatment  Patient Details  Name: Robert [name] MRN: [id number] Date of Birth: 26-Mar-1955 Referring Provider (PT): Ulyses Southward, PA-C   Encounter Date: 03/08/2020   PT End of Session - 03/08/20 0947    Visit Number 11    Number of Visits 18    Date for PT Re-Evaluation 04/04/20    Authorization Type [employer] Medicare (CQ modifier, KX modifier after 15th visit) FOTO; PROGRESS NOTE EVERY 10TH VISIT    PT Start Time 0945    PT Stop Time 1035    PT Time Calculation (min) 50 min    Activity Tolerance Patient tolerated treatment well    Behavior During Therapy [facility] for tasks assessed/performed           Past Medical History:  Diagnosis Date  . Thyroid disease     Past Surgical History:  Procedure Laterality Date  . COLONOSCOPY  1980's   in [facility]  . HERNIA REPAIR  03/2014   Umbilical  . NO PAST SURGERIES    . ORIF PATELLA Left 12/11/2019   Procedure: OPEN REDUCTION INTERNAL (ORIF) FIXATION PATELLA;  Surgeon: Roby Lofts, MD;  Location: [facility] OR;  Service: Orthopedics;  Laterality: Left;    There were no vitals filed for this visit.   Subjective Assessment - 03/08/20 0945    Subjective COVID 19 screening performed on patient upon arrival. Has been working out but having good days and bad days.    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 Yes    Pain Score 3     Pain Location Knee    Pain Orientation Left    Pain Descriptors / Indicators Burning    Pain Type Surgical pain    Pain Onset More than a month ago    Pain Frequency Intermittent              [facility] PT Assessment - 03/08/20 0001       Assessment   Medical Diagnosis L patella fracture, S/P ORIF    Referring Provider (PT) Ulyses Southward, PA-C    Onset Date/Surgical Date 12/11/19    Next MD Visit 05/03/2020    Prior Therapy no                         [facility] Adult PT Treatment/Exercise - 03/08/20 0001      Knee/Hip Exercises: Aerobic   Tread Mill 1.5 mph, no incline x5 min for gait sequencing    Recumbent Bike seat 5 x10 min/ partial revolutions for ROM      Knee/Hip Exercises: Machines for Strengthening   Cybex Knee Extension 10# 3x10 reps    Cybex Knee Flexion 30# 3x10 reps    Cybex Leg Press 1.5 pl, seat 7 x20 reps for knee flexion      Knee/Hip Exercises: Standing   Forward Lunges Left;20 reps;3 seconds    Forward Lunges Limitations off 14" step    Step Down Left;20 reps;Hand Hold: 2;Step Height: 6"      Modalities   Modalities Vasopneumatic      Vasopneumatic   Number Minutes Vasopneumatic  10 minutes    Vasopnuematic Location  Knee    Vasopneumatic Pressure Medium    Vasopneumatic Temperature  34 for edema      Manual Therapy   Manual Therapy Myofascial release    Myofascial Release IASTW to L knee (distal quad, medial knee, ITB) to reduce adhesions and tightness                       PT Long Term Goals - 02/25/20 1051      PT LONG TERM GOAL #1   Title Patient will be independent with HEP    Time 8    Period Weeks    Status Achieved      PT LONG TERM GOAL #2   Title Patient will demonstrate 115+ degrees of left knee flexion AROM to improve ability to perform functional tasks.    Time 8    Period Weeks    Status On-going      PT LONG TERM GOAL #3   Title Patient will demonstrate 5 degrees or less of left knee extension AROM to improve gait mechanics.    Time 8    Period Weeks    Status On-going      PT LONG TERM GOAL #4   Title Patient will ambulate community distances with minimal gait deviations and left knee pain less than or equal to 2/10.    Time 8     Period Weeks    Status On-going      PT LONG TERM GOAL #5   Title Patient will report ability to perform ADLs and recreational activities with left knee pain less than or equal to 2/10.    Time 8    Period Weeks    Status On-going                 Plan - 03/08/20 1034    Clinical Impression Statement Patient presented in clinic with reports of stiffness and discomfort still although he has been diligent with exercise to return to function. Patient progressed with resistance for strengthening and progressed to stationary bike for ROM. Patient able to demonstrate a good redness response with IASTW over L ITB, distal quad region. Patient reported L knee feeling "more loose" after end of IASTW session. Normal modalities response noted following removal of the modalities.    Personal Factors and Comorbidities Comorbidity 2    Comorbidities L patella ORIF 12/11/2019, hypothyroidism    Examination-Activity Limitations Squat;Stand;Locomotion Level;Bend    Stability/Clinical Decision Making Stable/Uncomplicated    Rehab Potential Good    PT Frequency 2x / week    PT Duration 8 weeks    PT Treatment/Interventions ADLs/Self Care Home Management;Cryotherapy;Electrical Stimulation;Moist Heat;Iontophoresis 4mg /ml Dexamethasone;Gait training;Stair training;Functional mobility training;Therapeutic activities;Therapeutic exercise;Balance training;Manual techniques;Passive range of motion;Patient/family education;Vasopneumatic Device;Taping    PT Next Visit Plan FOTO at 11th visit; attempt bike for more knee flexion ROM and strengthening.    PT Home Exercise Plan see patient education section           Patient will benefit from skilled therapeutic intervention in order to improve the following deficits and impairments:  Abnormal gait,Decreased range of motion,Difficulty walking,Decreased activity tolerance,Decreased balance,Decreased strength,Increased edema,Pain  Visit Diagnosis: Stiffness of  left knee, not elsewhere classified  Localized edema  Acute pain of left knee  Muscle weakness (generalized)     Problem List Patient Active Problem List   Diagnosis Date Noted  . Left patella fracture 12/10/2019  . Overweight (BMI 25.0-29.9) 09/20/2017  . Hypothyroidism following radioiodine therapy 03/11/2015  .  Urinary frequency 12/29/2014  . Thoracic aorta atherosclerosis (HCC) 04/13/2014    Marvell Fuller, PTA 03/08/2020, 10:39 AM  [facility] [address] [place], [place], [address] Phone: [phone]   Fax:  [phone]  Name: Robert [name] MRN: [id number] Date of Birth: 1955/09/04

## 2020-03-10 ENCOUNTER — Encounter: Payer: Self-pay | Admitting: Physical Therapy

## 2020-03-10 ENCOUNTER — Other Ambulatory Visit: Payer: Self-pay

## 2020-03-10 ENCOUNTER — Ambulatory Visit: Payer: Medicare HMO | Admitting: Physical Therapy

## 2020-03-10 DIAGNOSIS — M25662 Stiffness of left knee, not elsewhere classified: Secondary | ICD-10-CM

## 2020-03-10 DIAGNOSIS — R600 Localized edema: Secondary | ICD-10-CM | POA: Diagnosis not present

## 2020-03-10 DIAGNOSIS — M25562 Pain in left knee: Secondary | ICD-10-CM

## 2020-03-10 DIAGNOSIS — M6281 Muscle weakness (generalized): Secondary | ICD-10-CM | POA: Diagnosis not present

## 2020-03-10 NOTE — Therapy (Signed)
[facility] [address] [place], [place], [address] Phone: [phone]   Fax:  [phone]  Physical Therapy Treatment  Patient Details  Name: Robert [name] MRN: [id number] Date of Birth: Jun 19, 1955 Referring Provider (PT): Ulyses Southward, PA-C   Encounter Date: 03/10/2020   PT End of Session - 03/10/20 0959    Visit Number 12    Number of Visits 18    Date for PT Re-Evaluation 04/04/20    Authorization Type [employer] Medicare (CQ modifier, KX modifier after 15th visit) FOTO; PROGRESS NOTE EVERY 10TH VISIT    PT Start Time [phone]    PT Stop Time 1029    PT Time Calculation (min) 41 min    Activity Tolerance Patient tolerated treatment well    Behavior During Therapy [facility] for tasks assessed/performed           Past Medical History:  Diagnosis Date  . Thyroid disease     Past Surgical History:  Procedure Laterality Date  . COLONOSCOPY  1980's   in [facility]  . HERNIA REPAIR  03/2014   Umbilical  . NO PAST SURGERIES    . ORIF PATELLA Left 12/11/2019   Procedure: OPEN REDUCTION INTERNAL (ORIF) FIXATION PATELLA;  Surgeon: Roby Lofts, MD;  Location: [facility] OR;  Service: Orthopedics;  Laterality: Left;    There were no vitals filed for this visit.   Subjective Assessment - 03/10/20 0959    Subjective COVID 19 screening performed on patient upon arrival. Has been working out.    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 Yes    Pain Score 2     Pain Location Knee    Pain Orientation Left    Pain Descriptors / Indicators Discomfort    Pain Type Surgical pain    Pain Onset More than a month ago    Pain Frequency Intermittent              [facility] PT Assessment - 03/10/20 0001      Assessment   Medical  Diagnosis L patella fracture, S/P ORIF    Referring Provider (PT) Ulyses Southward, PA-C    Onset Date/Surgical Date 12/11/19    Next MD Visit 05/03/2020    Prior Therapy no                         [facility] Adult PT Treatment/Exercise - 03/10/20 0001      Knee/Hip Exercises: Aerobic   Elliptical L1, R4 x8 min    Nustep L4, seat 11-10 x13 min      Knee/Hip Exercises: Machines for Strengthening   Cybex Knee Extension 10# 3x10 reps    Cybex Knee Flexion 30# 3x10 reps    Cybex Leg Press 2 pl, seat 6 x20 reps      Knee/Hip Exercises: Prone   Prone Knee Hang 1 minute      Modalities   Modalities Vasopneumatic      Vasopneumatic   Number Minutes Vasopneumatic  10 minutes    Vasopnuematic Location  Knee    Vasopneumatic Pressure Medium    Vasopneumatic Temperature  34 for edema                       PT  Long Term Goals - 02/25/20 1051      PT LONG TERM GOAL #1   Title Patient will be independent with HEP    Time 8    Period Weeks    Status Achieved      PT LONG TERM GOAL #2   Title Patient will demonstrate 115+ degrees of left knee flexion AROM to improve ability to perform functional tasks.    Time 8    Period Weeks    Status On-going      PT LONG TERM GOAL #3   Title Patient will demonstrate 5 degrees or less of left knee extension AROM to improve gait mechanics.    Time 8    Period Weeks    Status On-going      PT LONG TERM GOAL #4   Title Patient will ambulate community distances with minimal gait deviations and left knee pain less than or equal to 2/10.    Time 8    Period Weeks    Status On-going      PT LONG TERM GOAL #5   Title Patient will report ability to perform ADLs and recreational activities with left knee pain less than or equal to 2/10.    Time 8    Period Weeks    Status On-going                 Plan - 03/10/20 1130    Clinical Impression Statement Patient presented in clinic with reports of some limitation  although he has been diligent with exercise. Patient progressed to more resistance with strengthening as well as to the elliptical. Ramp was increased so as to practice knee flexion on inclines. Prone hang also initated as to improve knee flexion. Normal vasopneumatic response noted following removal of the modality.    Personal Factors and Comorbidities Comorbidity 2    Comorbidities L patella ORIF 12/11/2019, hypothyroidism    Examination-Activity Limitations Squat;Stand;Locomotion Level;Bend    Stability/Clinical Decision Making Stable/Uncomplicated    Rehab Potential Good    PT Frequency 2x / week    PT Duration 8 weeks    PT Treatment/Interventions ADLs/Self Care Home Management;Cryotherapy;Electrical Stimulation;Moist Heat;Iontophoresis 4mg /ml Dexamethasone;Gait training;Stair training;Functional mobility training;Therapeutic activities;Therapeutic exercise;Balance training;Manual techniques;Passive range of motion;Patient/family education;Vasopneumatic Device;Taping    PT Next Visit Plan FOTO at 13th visit; attempt bike for more knee flexion ROM and strengthening.    PT Home Exercise Plan see patient education section    Consulted and Agree with Plan of Care Patient           Patient will benefit from skilled therapeutic intervention in order to improve the following deficits and impairments:  Abnormal gait,Decreased range of motion,Difficulty walking,Decreased activity tolerance,Decreased balance,Decreased strength,Increased edema,Pain  Visit Diagnosis: Stiffness of left knee, not elsewhere classified  Localized edema  Acute pain of left knee  Muscle weakness (generalized)     Problem List Patient Active Problem List   Diagnosis Date Noted  . Left patella fracture 12/10/2019  . Overweight (BMI 25.0-29.9) 09/20/2017  . Hypothyroidism following radioiodine therapy 03/11/2015  . Urinary frequency 12/29/2014  . Thoracic aorta atherosclerosis (HCC) 04/13/2014    04/15/2014, PTA 03/10/2020, 11:49 AM  [facility] [address] [place], [place], [place] Phone: [phone]   Fax:  [phone]  Name: Robert [name] MRN: [name] Date of Birth: Sep 05, 1955

## 2020-03-15 ENCOUNTER — Other Ambulatory Visit: Payer: Self-pay

## 2020-03-15 ENCOUNTER — Encounter: Payer: Self-pay | Admitting: Physical Therapy

## 2020-03-15 ENCOUNTER — Ambulatory Visit: Payer: Medicare HMO | Admitting: Physical Therapy

## 2020-03-15 DIAGNOSIS — M6281 Muscle weakness (generalized): Secondary | ICD-10-CM | POA: Diagnosis not present

## 2020-03-15 DIAGNOSIS — M25662 Stiffness of left knee, not elsewhere classified: Secondary | ICD-10-CM

## 2020-03-15 DIAGNOSIS — R600 Localized edema: Secondary | ICD-10-CM

## 2020-03-15 DIAGNOSIS — M25562 Pain in left knee: Secondary | ICD-10-CM

## 2020-03-15 NOTE — Therapy (Signed)
[facility] [place], [place], [address] Phone: [phone]   Fax:  [phone]  Physical Therapy Treatment  Patient Details  Name: Robert [name] MRN: [id number] Date of Birth: [birth year] Referring Provider (PT): Patrecia Pace, PA-C   Encounter Date: 03/15/2020   PT End of Session - 03/15/20 0947    Visit Number 13    Number of Visits 18    Date for PT Re-Evaluation 04/04/20    Authorization Type [employer] Medicare (CQ modifier, KX modifier after 15th visit) FOTO; PROGRESS NOTE EVERY 10TH VISIT    PT Start Time [phone]    PT Stop Time 1030    PT Time Calculation (min) 44 min    Activity Tolerance Patient tolerated treatment well    Behavior During Therapy [facility] for tasks assessed/performed           Past Medical History:  Diagnosis Date  . Thyroid disease     Past Surgical History:  Procedure Laterality Date  . COLONOSCOPY  1980's   in [facility]  . HERNIA REPAIR  08/3417   Umbilical  . NO PAST SURGERIES    . ORIF PATELLA Left 12/11/2019   Procedure: OPEN REDUCTION INTERNAL (ORIF) FIXATION PATELLA;  Surgeon: Shona Needles, MD;  Location: [place];  Service: Orthopedics;  Laterality: Left;    There were no vitals filed for this visit.   Subjective Assessment - 03/15/20 0947    Subjective COVID 19 screening performed on patient upon arrival. Has been working out.    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 Yes    Pain Score 2     Pain Location Knee    Pain Orientation Left    Pain Descriptors / Indicators Burning    Pain Type Surgical pain    Pain Onset More than a month ago    Pain Frequency Intermittent              [facility] PT Assessment - 03/15/20 0001      Assessment   Medical Diagnosis  L patella fracture, S/P ORIF    Referring Provider (PT) Patrecia Pace, PA-C    Onset Date/Surgical Date 12/11/19    Next MD Visit 05/03/2020    Prior Therapy no      Observation/Other Assessments   Focus on Therapeutic Outcomes (FOTO)  21% limitation at 13th visit on 03/15/2020      ROM / Strength   AROM / PROM / Strength AROM      AROM   Overall AROM  Deficits    AROM Assessment Site Knee    Right/Left Knee Left    Left Knee Extension 5    Left Knee Flexion 110                         [facility] Adult PT Treatment/Exercise - 03/15/20 0001      Knee/Hip Exercises: Aerobic   Stationary Bike seat 9 x18 min for ROM   only able to complete partial revolutions     Knee/Hip Exercises: Machines for Strengthening   Cybex Knee Extension 20# 3x10 reps    Cybex Knee Flexion 40# 3x10 reps    Cybex Leg Press 3.5 p, seat 7 x20 rpes      Knee/Hip  Exercises: Standing   Forward Lunges Left;20 reps;3 seconds    Forward Lunges Limitations off 8" step      Knee/Hip Exercises: Prone   Prone Knee Hang 2 minutes;Weights    Prone Knee Hang Weights (lbs) 2      Modalities   Modalities Vasopneumatic      Vasopneumatic   Number Minutes Vasopneumatic  10 minutes    Vasopnuematic Location  Knee    Vasopneumatic Pressure Medium    Vasopneumatic Temperature  34 for edema                       PT Long Term Goals - 03/15/20 1021      PT LONG TERM GOAL #1   Title Patient will be independent with HEP    Time 8    Period Weeks    Status Achieved      PT LONG TERM GOAL #2   Title Patient will demonstrate 115+ degrees of left knee flexion AROM to improve ability to perform functional tasks.    Time 8    Period Weeks    Status On-going      PT LONG TERM GOAL #3   Title Patient will demonstrate 5 degrees or less of left knee extension AROM to improve gait mechanics.    Time 8    Period Weeks    Status Achieved      PT LONG TERM GOAL #4   Title Patient will ambulate  community distances with minimal gait deviations and left knee pain less than or equal to 2/10.    Time 8    Period Weeks    Status Partially Met   minimal deviations in regards to L knee flexion deficit     PT LONG TERM GOAL #5   Title Patient will report ability to perform ADLs and recreational activities with left knee pain less than or equal to 2/10.    Time 8    Period Weeks    Status Achieved                 Plan - 03/15/20 1023    Clinical Impression Statement Patient presented in clinic with minimal burning along anterior L knee. Patient remains very diligent with all HEP and progression in the gym to progress strengthening. Patient instructed to add small resistance as he has not been using resistance while in the gym. Patient using incline on the elliptical and instructed to add lunges multiple times throughout the day. AROM of L knee continues to progress with today's measurement following therex as 5-110 deg. Normal vasopneumatic response noted following removal of the modality.    Personal Factors and Comorbidities Comorbidity 2    Comorbidities L patella ORIF 12/11/2019, hypothyroidism    Examination-Activity Limitations Squat;Stand;Locomotion Level;Bend    Stability/Clinical Decision Making Stable/Uncomplicated    Rehab Potential Good    PT Frequency 2x / week    PT Duration 8 weeks    PT Treatment/Interventions ADLs/Self Care Home Management;Cryotherapy;Electrical Stimulation;Moist Heat;Iontophoresis 27m/ml Dexamethasone;Gait training;Stair training;Functional mobility training;Therapeutic activities;Therapeutic exercise;Balance training;Manual techniques;Passive range of motion;Patient/family education;Vasopneumatic Device;Taping    PT Next Visit Plan FOTO at 13th visit; attempt bike for more knee flexion ROM and strengthening.    PT Home Exercise Plan see patient education section    Consulted and Agree with Plan of Care Patient           Patient will benefit  from skilled therapeutic intervention in order to improve the  following deficits and impairments:  Abnormal gait,Decreased range of motion,Difficulty walking,Decreased activity tolerance,Decreased balance,Decreased strength,Increased edema,Pain  Visit Diagnosis: Stiffness of left knee, not elsewhere classified  Localized edema  Acute pain of left knee  Muscle weakness (generalized)     Problem List Patient Active Problem List   Diagnosis Date Noted  . Left patella fracture 12/10/2019  . Overweight (BMI 25.0-29.9) 09/20/2017  . Hypothyroidism following radioiodine therapy 03/11/2015  . Urinary frequency 12/29/2014  . Thoracic aorta atherosclerosis [facility]) 04/13/2014    Standley Brooking, PTA 03/15/2020, 10:33 AM  [facility] [address] [place], [place], [address] Phone: [phone]   Fax:  [phone]  Name: Robert [name] MRN: [id number] Date of Birth: 1955-12-16

## 2020-03-17 ENCOUNTER — Ambulatory Visit: Payer: Medicare HMO | Admitting: Physical Therapy

## 2020-03-17 ENCOUNTER — Encounter: Payer: Self-pay | Admitting: Physical Therapy

## 2020-03-17 ENCOUNTER — Other Ambulatory Visit: Payer: Self-pay

## 2020-03-17 DIAGNOSIS — M6281 Muscle weakness (generalized): Secondary | ICD-10-CM

## 2020-03-17 DIAGNOSIS — R600 Localized edema: Secondary | ICD-10-CM

## 2020-03-17 DIAGNOSIS — M25662 Stiffness of left knee, not elsewhere classified: Secondary | ICD-10-CM

## 2020-03-17 DIAGNOSIS — M25562 Pain in left knee: Secondary | ICD-10-CM

## 2020-03-17 NOTE — Therapy (Signed)
[facility] [place], [place], [address] Phone: [phone]   Fax:  [phone]  Physical Therapy Treatment  Patient Details  Name: Robert [name] MRN: [id number] Date of Birth: [birth year] Referring Provider (PT): Patrecia Pace, PA-C   Encounter Date: 03/17/2020   PT End of Session - 03/17/20 1305    Visit Number 14    Number of Visits 18    Date for PT Re-Evaluation 04/04/20    Authorization Type [employer] Medicare (CQ modifier, KX modifier after 15th visit) FOTO; PROGRESS NOTE EVERY 10TH VISIT    PT Start Time 1302    PT Stop Time 1353    PT Time Calculation (min) 51 min    Activity Tolerance Patient tolerated treatment well    Behavior During Therapy [facility] for tasks assessed/performed           Past Medical History:  Diagnosis Date  . Thyroid disease     Past Surgical History:  Procedure Laterality Date  . COLONOSCOPY  1980's   in [facility]  . HERNIA REPAIR  06/1605   Umbilical  . NO PAST SURGERIES    . ORIF PATELLA Left 12/11/2019   Procedure: OPEN REDUCTION INTERNAL (ORIF) FIXATION PATELLA;  Surgeon: Shona Needles, MD;  Location: [place];  Service: Orthopedics;  Laterality: Left;    There were no vitals filed for this visit.   Subjective Assessment - 03/17/20 1302    Subjective COVID 19 screening performed on patient upon arrival. Has been working out.    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 Yes    Pain Score 2     Pain Location Knee    Pain Orientation Left    Pain Descriptors / Indicators Burning    Pain Type Surgical pain    Pain Onset More than a month ago    Pain Frequency Intermittent              [facility] PT Assessment - 03/17/20 0001      Assessment   Medical Diagnosis  L patella fracture, S/P ORIF    Referring Provider (PT) Patrecia Pace, PA-C    Onset Date/Surgical Date 12/11/19    Next MD Visit 05/03/2020    Prior Therapy no                         [facility] Adult PT Treatment/Exercise - 03/17/20 0001      Knee/Hip Exercises: Aerobic   Stationary Bike seat 11-10 x10 min   backward revolution   Elliptical R5, L3 x5 min      Knee/Hip Exercises: Machines for Strengthening   Cybex Knee Extension 20# 3x10 reps    Cybex Knee Flexion 40# 3x10 reps    Cybex Leg Press 2.5 p, seat 7 x30 rpes      Knee/Hip Exercises: Prone   Prone Knee Hang 3 minutes;Weights    Prone Knee Hang Weights (lbs) 4      Modalities   Modalities Vasopneumatic      Vasopneumatic   Number Minutes Vasopneumatic  10 minutes    Vasopnuematic Location  Knee    Vasopneumatic Pressure Medium    Vasopneumatic Temperature  34 for edema      Manual Therapy   Manual Therapy  Myofascial release    Myofascial Release IASTW to L knee (distal quad, ITB, posterior HS) to reduce adhesions and tightness                       PT Long Term Goals - 03/15/20 1021      PT LONG TERM GOAL #1   Title Patient will be independent with HEP    Time 8    Period Weeks    Status Achieved      PT LONG TERM GOAL #2   Title Patient will demonstrate 115+ degrees of left knee flexion AROM to improve ability to perform functional tasks.    Time 8    Period Weeks    Status On-going      PT LONG TERM GOAL #3   Title Patient will demonstrate 5 degrees or less of left knee extension AROM to improve gait mechanics.    Time 8    Period Weeks    Status Achieved      PT LONG TERM GOAL #4   Title Patient will ambulate community distances with minimal gait deviations and left knee pain less than or equal to 2/10.    Time 8    Period Weeks    Status Partially Met   minimal deviations in regards to L knee flexion deficit     PT LONG TERM GOAL #5   Title Patient will report ability  to perform ADLs and recreational activities with left knee pain less than or equal to 2/10.    Time 8    Period Weeks    Status Achieved                 Plan - 03/17/20 1401    Clinical Impression Statement Patient presented in clinic with reports of tightness of lateral L knee and low level burning of L knee. Patient progressed through exercise to improve strength and ROM. IASTW completed to distal ITB, lateral HS with min-mod redness response to tissue. Normal vasopneumatic response noted following removal of the modality.    Personal Factors and Comorbidities Comorbidity 2    Comorbidities L patella ORIF 12/11/2019, hypothyroidism    Examination-Activity Limitations Squat;Stand;Locomotion Level;Bend    Stability/Clinical Decision Making Stable/Uncomplicated    Rehab Potential Good    PT Frequency 2x / week    PT Duration 8 weeks    PT Treatment/Interventions ADLs/Self Care Home Management;Cryotherapy;Electrical Stimulation;Moist Heat;Iontophoresis 56m/ml Dexamethasone;Gait training;Stair training;Functional mobility training;Therapeutic activities;Therapeutic exercise;Balance training;Manual techniques;Passive range of motion;Patient/family education;Vasopneumatic Device;Taping    PT Next Visit Plan attempt bike for more knee flexion ROM and strengthening.    PT Home Exercise Plan see patient education section    Consulted and Agree with Plan of Care Patient           Patient will benefit from skilled therapeutic intervention in order to improve the following deficits and impairments:  Abnormal gait,Decreased range of motion,Difficulty walking,Decreased activity tolerance,Decreased balance,Decreased strength,Increased edema,Pain  Visit Diagnosis: Stiffness of left knee, not elsewhere classified  Localized edema  Acute pain of left knee  Muscle weakness (generalized)     Problem List Patient Active Problem List   Diagnosis Date Noted  . Left patella fracture  12/10/2019  . Overweight (BMI 25.0-29.9) 09/20/2017  . Hypothyroidism following radioiodine therapy 03/11/2015  . Urinary frequency 12/29/2014  . [place]acic [place] 04/13/2014    KStandley Brooking PTA 03/17/2020, 2:07 PM  [facility] [address]  [place], [place], [address] Phone: [phone]   Fax:  [phone]  Name: Robert [name] MRN: [id number] Date of Birth: 1955-03-07

## 2020-03-22 ENCOUNTER — Encounter: Payer: Self-pay | Admitting: Physical Therapy

## 2020-03-22 ENCOUNTER — Ambulatory Visit: Payer: Medicare HMO | Attending: Student | Admitting: Physical Therapy

## 2020-03-22 ENCOUNTER — Other Ambulatory Visit: Payer: Self-pay

## 2020-03-22 DIAGNOSIS — M25562 Pain in left knee: Secondary | ICD-10-CM | POA: Diagnosis not present

## 2020-03-22 DIAGNOSIS — M25662 Stiffness of left knee, not elsewhere classified: Secondary | ICD-10-CM | POA: Diagnosis not present

## 2020-03-22 DIAGNOSIS — M6281 Muscle weakness (generalized): Secondary | ICD-10-CM | POA: Diagnosis not present

## 2020-03-22 DIAGNOSIS — R600 Localized edema: Secondary | ICD-10-CM

## 2020-03-22 NOTE — Therapy (Signed)
[facility] [place], [place], [address] Phone: [phone]   Fax:  [phone]  Physical Therapy Treatment  Patient Details  Name: Robert [name] MRN: [id number] Date of Birth: 1955/08/12  Referring Provider (PT): Patrecia Pace, PA-C   Encounter Date: 03/22/2020   PT End of Session - 03/22/20 0957    Visit Number 15    Number of Visits 18    Date for PT Re-Evaluation 04/04/20    Authorization Type [employer] Medicare (CQ modifier, KX modifier after 15th visit) FOTO; PROGRESS NOTE EVERY 10TH VISIT    PT Start Time 0945    PT Stop Time 1035    PT Time Calculation (min) 50 min    Activity Tolerance Patient tolerated treatment well    Behavior During Therapy [facility] for tasks assessed/performed           Past Medical History:  Diagnosis Date  . Thyroid disease     Past Surgical History:  Procedure Laterality Date  . COLONOSCOPY  1980's   in [facility]  . HERNIA REPAIR  05/3792   Umbilical  . NO PAST SURGERIES    . ORIF PATELLA Left 12/11/2019   Procedure: OPEN REDUCTION INTERNAL (ORIF) FIXATION PATELLA;  Surgeon: Shona Needles, MD;  Location: [place];  Service: Orthopedics;  Laterality: Left;    There were no vitals filed for this visit.   Subjective Assessment - 03/22/20 1422    Subjective COVID 19 screening performed on patient upon arrival. Patient reports feeling tight 2.5/10.    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 Yes    Pain Score 2     Pain Location Knee    Pain Orientation Left    Pain Descriptors / Indicators Sore    Pain Type Surgical pain    Pain Onset More than a month ago    Pain Frequency Intermittent              [facility] PT Assessment - 03/22/20 0001      Assessment    Medical Diagnosis L patella fracture, S/P ORIF    Referring Provider (PT) Patrecia Pace, PA-C    Onset Date/Surgical Date 12/11/19    Next MD Visit 05/03/2020    Prior Therapy no                         [facility] Adult PT Treatment/Exercise - 03/22/20 0001      Knee/Hip Exercises: Aerobic   Stationary Bike seat 6 x15 min      Knee/Hip Exercises: Machines for Strengthening   Cybex Knee Extension --    Cybex Knee Flexion --    Cybex Leg Press 2.5 p, seat 7 3x10      Knee/Hip Exercises: Prone   Prone Knee Hang 3 minutes;Weights    Prone Knee Hang Weights (lbs) 4    Prone Knee Hang Limitations with IASTM      Modalities   Modalities Vasopneumatic      Vasopneumatic   Number Minutes Vasopneumatic  10 minutes    Vasopnuematic Location  Knee    Vasopneumatic Pressure Medium    Vasopneumatic Temperature  34 for edema      Manual Therapy   Manual Therapy Myofascial release  Myofascial Release IASTW to L knee (distal quad, ITB, posterior HS) to reduce adhesions and tightness                       PT Long Term Goals - 03/15/20 1021      PT LONG TERM GOAL #1   Title Patient will be independent with HEP    Time 8    Period Weeks    Status Achieved      PT LONG TERM GOAL #2   Title Patient will demonstrate 115+ degrees of left knee flexion AROM to improve ability to perform functional tasks.    Time 8    Period Weeks    Status On-going      PT LONG TERM GOAL #3   Title Patient will demonstrate 5 degrees or less of left knee extension AROM to improve gait mechanics.    Time 8    Period Weeks    Status Achieved      PT LONG TERM GOAL #4   Title Patient will ambulate community distances with minimal gait deviations and left knee pain less than or equal to 2/10.    Time 8    Period Weeks    Status Partially Met   minimal deviations in regards to L knee flexion deficit     PT LONG TERM GOAL #5   Title Patient will report ability to perform ADLs and  recreational activities with left knee pain less than or equal to 2/10.    Time 8    Period Weeks    Status Achieved                 Plan - 03/22/20 1000    Clinical Impression Statement Patient arrives to phyiscal therapy doing fairly well. Patient progressed to the bike and was able to perform full revoultions though with intermittent LE hip hike. IASTM provided to distal hamstring tendons during prone hang and in supine in thomas stretch position to distal quad. Patient with mild erythema to lateral distal hamstring and quad. Patient and PT discussed plan of care to perform PT at 2x per week for remaining visits then recert for 5 visits until MD follow up. Patient reported agreement.    Personal Factors and Comorbidities Comorbidity 2    Comorbidities L patella ORIF 12/11/2019, hypothyroidism    Examination-Activity Limitations Squat;Stand;Locomotion Level;Bend    Stability/Clinical Decision Making Stable/Uncomplicated    Clinical Decision Making Low    Rehab Potential Good    PT Frequency 2x / week    PT Duration 8 weeks    PT Treatment/Interventions ADLs/Self Care Home Management;Cryotherapy;Electrical Stimulation;Moist Heat;Iontophoresis 71m/ml Dexamethasone;Gait training;Stair training;Functional mobility training;Therapeutic activities;Therapeutic exercise;Balance training;Manual techniques;Passive range of motion;Patient/family education;Vasopneumatic Device;Taping    PT Next Visit Plan attempt bike for more knee flexion ROM and strengthening.; 2x per week for remaining visits (18) then 1x for 5 weeks until MD appointment.    PT Home Exercise Plan see patient education section    Consulted and Agree with Plan of Care Patient           Patient will benefit from skilled therapeutic intervention in order to improve the following deficits and impairments:  Abnormal gait,Decreased range of motion,Difficulty walking,Decreased activity tolerance,Decreased balance,Decreased  strength,Increased edema,Pain  Visit Diagnosis: Stiffness of left knee, not elsewhere classified  Localized edema  Acute pain of left knee  Muscle weakness (generalized)     Problem List Patient Active Problem List   Diagnosis Date  Noted  . Left patella fracture 12/10/2019  . Overweight (BMI 25.0-29.9) 09/20/2017  . Hypothyroidism following radioiodine therapy 03/11/2015  . Urinary frequency 12/29/2014  . Thoracic aorta atherosclerosis ([place]) 04/13/2014    Gabriela Eves, PT, DPT 03/22/2020, 2:23 PM  [facility] [address] [place], [place], [address] Phone: [phone]   Fax:  [phone]  Name: Robert [name] MRN: [id number] Date of Birth: 1955-03-06

## 2020-03-24 ENCOUNTER — Ambulatory Visit: Payer: Medicare HMO | Admitting: Physical Therapy

## 2020-03-24 ENCOUNTER — Other Ambulatory Visit: Payer: Self-pay

## 2020-03-24 DIAGNOSIS — M25662 Stiffness of left knee, not elsewhere classified: Secondary | ICD-10-CM | POA: Diagnosis not present

## 2020-03-24 DIAGNOSIS — R600 Localized edema: Secondary | ICD-10-CM | POA: Diagnosis not present

## 2020-03-24 DIAGNOSIS — M25562 Pain in left knee: Secondary | ICD-10-CM

## 2020-03-24 DIAGNOSIS — M6281 Muscle weakness (generalized): Secondary | ICD-10-CM | POA: Diagnosis not present

## 2020-03-24 NOTE — Therapy (Signed)
[facility] [place], [place], [address] Phone: [phone]   Fax:  [phone]  Physical Therapy Treatment  Patient Details  Name: Robert [name] MRN: [id number] Date of Birth: 11-May-1955 Referring Provider (PT): Robert Pace, PA-C   Encounter Date: 03/24/2020   PT End of Session - 03/24/20 1351    Visit Number 16    Number of Visits 18    Date for PT Re-Evaluation 04/04/20    Authorization Type A[employer]na Medicare (CQ modifier, KX modifier after 15th visit) FOTO13th 21%; PROGRESS NOTE EVERY 10TH VISIT    PT Start Time 0100    PT Stop Time 0156    PT Time Calculation (min) 56 min    Activity Tolerance Patient tolerated treatment well    Behavior During Therapy WFL for tasks assessed/performed           Past Medical History:  Diagnosis Date  . Thyroid disease     Past Surgical History:  Procedure Laterality Date  . COLONOSCOPY  1980's   in [facility]  . HERNIA REPAIR  05/3297   Umbilical  . NO PAST SURGERIES    . ORIF PATELLA Left 12/11/2019   Procedure: OPEN REDUCTION INTERNAL (ORIF) FIXATION PATELLA;  Surgeon: Robert Needles, MD;  Location: [place];  Service: Orthopedics;  Laterality: Left;    There were no vitals filed for this visit.   Subjective Assessment - 03/24/20 1303    Subjective COVID 19 screening performed on patient upon arrival. Patient arrived with tightness today.    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 Yes    Pain Score 2     Pain Location Knee    Pain Orientation Left    Pain Descriptors / Indicators Sore    Pain Type Surgical pain    Pain Onset More than a month ago    Pain Frequency Intermittent    Aggravating Factors  prolong standing    Pain Relieving Factors meds  / rest              [facility] PT Assessment - 03/24/20 0001      AROM   AROM Assessment Site Knee    Right/Left Knee Left    Left Knee Flexion 105      PROM   PROM Assessment Site Knee    Right/Left Knee Left    Left Knee Flexion 112                         [facility] Adult PT Treatment/Exercise - 03/24/20 0001      Knee/Hip Exercises: Aerobic   Stationary Bike adjusted for ROM x15 min      Vasopneumatic   Number Minutes Vasopneumatic  15 minutes    Vasopnuematic Location  Knee    Vasopneumatic Pressure Medium    Vasopneumatic Temperature  34 for edema      Manual Therapy   Manual Therapy Passive ROM;Soft tissue mobilization    Manual therapy comments patella mobs to improve mobility    Soft tissue mobilization manual STW to HS, lateral knee and insition to decrease tightness    Passive ROM manual PROM for left knee flexion in sitting and supine with holds to improve mobility  PT Long Term Goals - 03/24/20 1304      PT LONG TERM GOAL #1   Title Patient will be independent with HEP    Time 8    Period Weeks    Status Achieved      PT LONG TERM GOAL #2   Title Patient will demonstrate 115+ degrees of left knee flexion AROM to improve ability to perform functional tasks.    Baseline AROM 105 degrees 03/24/20    Time 8    Period Weeks    Status On-going      PT LONG TERM GOAL #3   Title Patient will demonstrate 5 degrees or less of left knee extension AROM to improve gait mechanics.    Time 8    Period Weeks    Status Achieved      PT LONG TERM GOAL #4   Title Patient will ambulate community distances with minimal gait deviations and left knee pain less than or equal to 2/10.    Time 8    Period Weeks    Status Partially Met      PT LONG TERM GOAL #5   Title Patient will report ability to perform ADLs and recreational activities with left knee pain less than or equal to 2/10.    Time 8    Period Weeks    Status  Achieved                 Plan - 03/24/20 1352    Clinical Impression Statement Patient tolerated treatment well today. Patient arrived with increased tightness in left knee yet was able to increase after manual stretching. Patient has sharp pain lateral knee with flexion stretching. Patient current goals progressing.    Personal Factors and Comorbidities Comorbidity 2    Comorbidities L patella ORIF 12/11/2019, hypothyroidism    Examination-Activity Limitations Squat;Stand;Locomotion Level;Bend    Stability/Clinical Decision Making Stable/Uncomplicated    Rehab Potential Good    PT Frequency 2x / week    PT Duration 8 weeks    PT Treatment/Interventions ADLs/Self Care Home Management;Cryotherapy;Electrical Stimulation;Moist Heat;Iontophoresis 52m/ml Dexamethasone;Gait training;Stair training;Functional mobility training;Therapeutic activities;Therapeutic exercise;Balance training;Manual techniques;Passive range of motion;Patient/family education;Vasopneumatic Device;Taping    PT Next Visit Plan cont with bike for more knee flexion ROM and strengthening.; 2x per week for remaining visits (18) then 1x for 5 weeks until MD appointment.    Consulted and Agree with Plan of Care Patient           Patient will benefit from skilled therapeutic intervention in order to improve the following deficits and impairments:  Abnormal gait,Decreased range of motion,Difficulty walking,Decreased activity tolerance,Decreased balance,Decreased strength,Increased edema,Pain  Visit Diagnosis: Stiffness of left knee, not elsewhere classified  Localized edema  Acute pain of left knee  Muscle weakness (generalized)     Problem List Patient Active Problem List   Diagnosis Date Noted  . Left patella fracture 12/10/2019  . Overweight (BMI 25.0-29.9) 09/20/2017  . Hypothyroidism following radioiodine therapy 03/11/2015  . Urinary frequency 12/29/2014  . Thoracic aorta atherosclerosis ([place]  04/13/2014    Robert [name] P, PTA 03/24/2020, 2:08 PM  [facility] [address] [place] [address]Phone: [phone]  Fax:  [phone] Name: Robert WigingtonMRN: [id number] of Birth: 106/01/1955

## 2020-03-29 ENCOUNTER — Ambulatory Visit: Payer: Medicare HMO | Admitting: *Deleted

## 2020-03-29 ENCOUNTER — Other Ambulatory Visit: Payer: Self-pay

## 2020-03-29 DIAGNOSIS — M6281 Muscle weakness (generalized): Secondary | ICD-10-CM | POA: Diagnosis not present

## 2020-03-29 DIAGNOSIS — M25662 Stiffness of left knee, not elsewhere classified: Secondary | ICD-10-CM

## 2020-03-29 DIAGNOSIS — M25562 Pain in left knee: Secondary | ICD-10-CM | POA: Diagnosis not present

## 2020-03-29 DIAGNOSIS — R600 Localized edema: Secondary | ICD-10-CM

## 2020-03-29 NOTE — Therapy (Unsigned)
[facility] [place], [place], [address] Phone: [phone]   Fax:  [phone]  Physical Therapy Treatment  Patient Details  Name: Robert [name] MRN: [id number] Date of Birth: 04-Feb-1955 Referring Provider (PT): Patrecia Pace, PA-C   Encounter Date: 03/29/2020   PT End of Session - 03/29/20 0952    Visit Number 17    Number of Visits 18    Date for PT Re-Evaluation 04/04/20    Authorization Type [employer] Medicare (CQ modifier, KX modifier after 15th visit) FOTO13th 21%; PROGRESS NOTE EVERY 10TH VISIT    PT Start Time 4    PT Stop Time 2633    PT Time Calculation (min) 50 min           Past Medical History:  Diagnosis Date  . Thyroid disease     Past Surgical History:  Procedure Laterality Date  . COLONOSCOPY  1980's   in [facility]  . HERNIA REPAIR  03/5454   Umbilical  . NO PAST SURGERIES    . ORIF PATELLA Left 12/11/2019   Procedure: OPEN REDUCTION INTERNAL (ORIF) FIXATION PATELLA;  Surgeon: Shona Needles, MD;  Location: [place];  Service: Orthopedics;  Laterality: Left;    There were no vitals filed for this visit.   Subjective Assessment - 03/29/20 0949    Subjective COVID 19 screening performed on patient upon arrival. Patient arrived with tightness today.  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 Yes    Pain Score 2     Pain Location Knee    Pain Orientation Left    Pain Descriptors / Indicators Sore    Pain Type Surgical pain    Pain Onset More than a month ago                             [facility] Adult PT Treatment/Exercise - 03/29/20 0001      Knee/Hip Exercises: Aerobic   Stationary Bike seat adjusted ,9,8,7  for ROM x15 min      Knee/Hip Exercises: Standing   Rocker Board 3 minutes   calf  stretching     Modalities   Modalities Vasopneumatic      Vasopneumatic   Number Minutes Vasopneumatic  10 minutes    Vasopnuematic Location  Knee    Vasopneumatic Pressure Medium    Vasopneumatic Temperature  34 for edema      Manual Therapy   Manual Therapy Passive ROM;Soft tissue mobilization    Manual therapy comments patella mobs to improve mobility    Passive ROM contract /relax technique used for flexion and extension ROM.progression and sitting and supine                       PT Long Term Goals - 03/24/20 1304      PT LONG TERM GOAL #1   Title Patient will be independent with HEP    Time 8    Period Weeks    Status Achieved      PT LONG TERM GOAL #2   Title Patient will demonstrate 115+ degrees of left knee flexion AROM to improve ability to perform functional tasks.    Baseline AROM 105 degrees 03/24/20    Time 8  Period Weeks    Status On-going      PT LONG TERM GOAL #3   Title Patient will demonstrate 5 degrees or less of left knee extension AROM to improve gait mechanics.    Time 8    Period Weeks    Status Achieved      PT LONG TERM GOAL #4   Title Patient will ambulate community distances with minimal gait deviations and left knee pain less than or equal to 2/10.    Time 8    Period Weeks    Status Partially Met      PT LONG TERM GOAL #5   Title Patient will report ability to perform ADLs and recreational activities with left knee pain less than or equal to 2/10.    Time 8    Period Weeks    Status Achieved                 Plan - 03/29/20 0954    Clinical Impression Statement Pt arrived today doing well with mainly tightness/soreness LT knee. Rx focused on contract/relax techniques for flexion and extension ROM in sitting and supine and did well.    Personal Factors and Comorbidities Comorbidity 2    Comorbidities L patella ORIF 12/11/2019, hypothyroidism    Examination-Activity Limitations Squat;Stand;Locomotion Level;Bend     Rehab Potential Good    PT Frequency 2x / week    PT Treatment/Interventions ADLs/Self Care Home Management;Cryotherapy;Electrical Stimulation;Moist Heat;Iontophoresis 42m/ml Dexamethasone;Gait training;Stair training;Functional mobility training;Therapeutic activities;Therapeutic exercise;Balance training;Manual techniques;Passive range of motion;Patient/family education;Vasopneumatic Device;Taping    PT Next Visit Plan cont with bike for more knee flexion ROM and strengthening.; 2x per week for remaining visits (18) then 1x for 5 weeks until MD appointment.    PT Home Exercise Plan see patient education section    Consulted and Agree with Plan of Care Patient           Patient will benefit from skilled therapeutic intervention in order to improve the following deficits and impairments:  Abnormal gait,Decreased range of motion,Difficulty walking,Decreased activity tolerance,Decreased balance,Decreased strength,Increased edema,Pain  Visit Diagnosis: Stiffness of left knee, not elsewhere classified  Localized edema  Acute pain of left knee  Muscle weakness (generalized)     Problem List Patient Active Problem List   Diagnosis Date Noted  . Left patella fracture 12/10/2019  . Overweight (BMI 25.0-29.9) 09/20/2017  . Hypothyroidism following radioiodine therapy 03/11/2015  . Urinary frequency 12/29/2014  . Thoracic aorta atherosclerosis ([facility] [place] 04/13/2014    RAMSEUR,CHRIS, PTA 03/29/2020, 6:26 PM  [facility] [address]MD[address] [place] [address]Phone: [phone]  Fax:  [phone] Name: Robert ShurleyMRN: [id number] of Birth: 128-May-1957

## 2020-03-31 ENCOUNTER — Other Ambulatory Visit: Payer: Self-pay

## 2020-03-31 ENCOUNTER — Ambulatory Visit: Payer: Medicare HMO | Admitting: Physical Therapy

## 2020-03-31 DIAGNOSIS — M6281 Muscle weakness (generalized): Secondary | ICD-10-CM | POA: Diagnosis not present

## 2020-03-31 DIAGNOSIS — M25562 Pain in left knee: Secondary | ICD-10-CM | POA: Diagnosis not present

## 2020-03-31 DIAGNOSIS — M25662 Stiffness of left knee, not elsewhere classified: Secondary | ICD-10-CM | POA: Diagnosis not present

## 2020-03-31 DIAGNOSIS — R600 Localized edema: Secondary | ICD-10-CM | POA: Diagnosis not present

## 2020-03-31 NOTE — Therapy (Signed)
Robert [name], [name], [address] Phone: [phone]   Fax:  [phone]  Physical Therapy Treatment  Patient Details  Name: Robert [name] MRN: [id number] Date of Birth: [birth year] Referring Provider (PT): Patrecia Pace, PA-C   Encounter Date: 03/31/2020   PT End of Session - 03/31/20 0956    Visit Number 18    Number of Visits 24    Date for PT Re-Evaluation 05/20/20    Authorization Type A[employer]na Medicare (CQ modifier, KX modifier after 15th visit) FOTO13th 21%; PROGRESS NOTE EVERY 10TH VISIT    PT Start Time 37    PT Stop Time 1032    PT Time Calculation (min) 47 min    Activity Tolerance Patient tolerated treatment well    Behavior During Therapy WFL for tasks assessed/performed           Past Medical History:  Diagnosis Date  . Thyroid disease     Past Surgical History:  Procedure Laterality Date  . COLONOSCOPY  1980's   in [facility]  . HERNIA REPAIR  02/6832   Umbilical  . NO PAST SURGERIES    . ORIF PATELLA Left 12/11/2019   Procedure: OPEN REDUCTION INTERNAL (ORIF) FIXATION PATELLA;  Surgeon: Shona Needles, MD;  Location: [place];  Service: Orthopedics;  Laterality: Left;    There were no vitals filed for this visit.   Subjective Assessment - 03/31/20 0954    Subjective COVID 19 screening performed on patient upon arrival. Patient reported ongoing tightness.    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 Yes    Pain Score 2     Pain Location Knee    Pain Orientation Left    Pain Descriptors / Indicators Sore    Pain Type Surgical pain    Pain Onset More than a month ago    Pain Frequency Intermittent    Aggravating Factors  prolong activity    Pain Relieving Factors  meds/rest              [facility] PT Assessment - 03/31/20 0001      AROM   AROM Assessment Site Knee    Right/Left Knee Left    Left Knee Flexion 105      PROM   PROM Assessment Site Knee    Right/Left Knee Left    Left Knee Flexion 113                         [facility] Adult PT Treatment/Exercise - 03/31/20 0001      Knee/Hip Exercises: Aerobic   Stationary Bike x72mn adjusted for ROM      Knee/Hip Exercises: Standing   Rocker Board 3 minutes   calf stretch     Vasopneumatic   Number Minutes Vasopneumatic  10 minutes    Vasopnuematic Location  Knee    Vasopneumatic Pressure Medium    Vasopneumatic Temperature  34 for edema      Manual Therapy   Manual Therapy Passive ROM;Soft tissue mobilization    Manual therapy comments patella mobs to improve mobility    Passive ROM manual PROM for flexion in sitting and supine and ext with overpresure  PT Long Term Goals - 03/31/20 1751      PT LONG TERM GOAL #1   Title Patient will be independent with HEP    Time 8    Period Weeks    Status Achieved      PT LONG TERM GOAL #2   Title Patient will demonstrate 115+ degrees of left knee flexion AROM to improve ability to perform functional tasks.    Baseline AROM 105 degrees 03/31/20    Time 8    Period Weeks    Status On-going      PT LONG TERM GOAL #3   Title Patient will demonstrate 5 degrees or less of left knee extension AROM to improve gait mechanics.    Time 8    Period Weeks    Status Achieved      PT LONG TERM GOAL #4   Title Patient will ambulate community distances with minimal gait deviations and left knee pain less than or equal to 2/10.    Time 8    Period Weeks    Status Partially Met      PT LONG TERM GOAL #5   Title Patient will report ability to perform ADLs and recreational activities with left knee pain less than or equal to 2/10.    Time 8    Period Weeks    Status Achieved                  Plan - 03/31/20 1025    Clinical Impression Statement Patient tolerated treatment well today. Patient continues to maintain ROM in left knee. Patient going to gym 3-4times a week. Today focused on manual stretching to improve mobility. Patient goals progressing this week.    Personal Factors and Comorbidities Comorbidity 2    Comorbidities L patella ORIF 12/11/2019, hypothyroidism    Examination-Activity Limitations Squat;Stand;Locomotion Level;Bend    Stability/Clinical Decision Making Stable/Uncomplicated    Rehab Potential Good    PT Frequency 2x / week    PT Duration 8 weeks    PT Treatment/Interventions ADLs/Self Care Home Management;Cryotherapy;Electrical Stimulation;Moist Heat;Iontophoresis 28m/ml Dexamethasone;Gait training;Stair training;Functional mobility training;Therapeutic activities;Therapeutic exercise;Balance training;Manual techniques;Passive range of motion;Patient/family education;Vasopneumatic Device;Taping    PT Next Visit Plan cont with ROM 1 time a week until MD appt F/U 05/04/19    Consulted and Agree with Plan of Care Patient           Patient will benefit from skilled therapeutic intervention in order to improve the following deficits and impairments:  Abnormal gait,Decreased range of motion,Difficulty walking,Decreased activity tolerance,Decreased balance,Decreased strength,Increased edema,Pain  Visit Diagnosis: Stiffness of left knee, not elsewhere classified  Localized edema  Acute pain of left knee  Muscle weakness (generalized)     Problem List Patient Active Problem List   Diagnosis Date Noted  . Left patella fracture 12/10/2019  . Overweight (BMI 25.0-29.9) 09/20/2017  . Hypothyroidism following radioiodine therapy 03/11/2015  . Urinary frequency 12/29/2014  . Thoracic aorta atherosclerosis ([place] 04/13/2014    Robert [name] P, PTA 03/31/2020, 10:35 AM  [facility] [address]Phone: [phone]  Fax:  [phone] Name: Robert BattagliniMRN: [id number] of Birth: 106-29-1957

## 2020-04-06 ENCOUNTER — Ambulatory Visit: Payer: Medicare HMO | Admitting: Physical Therapy

## 2020-04-06 ENCOUNTER — Other Ambulatory Visit: Payer: Self-pay

## 2020-04-06 DIAGNOSIS — R600 Localized edema: Secondary | ICD-10-CM | POA: Diagnosis not present

## 2020-04-06 DIAGNOSIS — M6281 Muscle weakness (generalized): Secondary | ICD-10-CM

## 2020-04-06 DIAGNOSIS — M25562 Pain in left knee: Secondary | ICD-10-CM | POA: Diagnosis not present

## 2020-04-06 DIAGNOSIS — M25662 Stiffness of left knee, not elsewhere classified: Secondary | ICD-10-CM

## 2020-04-06 NOTE — Therapy (Signed)
[facility] [place], [place], [address] Phone: [phone]   Fax:  [phone]  Physical Therapy Treatment  Patient Details  Name: Robert [name] MRN: [id number] Date of Birth: 01-16-1956 Referring Provider (PT): Robert Pace, PA-C   Encounter Date: 04/06/2020   PT End of Session - 04/06/20 0952    Visit Number 19    Number of Visits 24    Date for PT Re-Evaluation 05/20/20    Authorization Type A[employer]na Medicare (CQ modifier, KX modifier after 15th visit) FOTO13th 21%; PROGRESS NOTE EVERY 10TH VISIT    PT Start Time 33    PT Stop Time 1037    PT Time Calculation (min) 52 min    Activity Tolerance Patient tolerated treatment well    Behavior During Therapy WFL for tasks assessed/performed           Past Medical History:  Diagnosis Date  . Thyroid disease     Past Surgical History:  Procedure Laterality Date  . COLONOSCOPY  1980's   in [facility]  . HERNIA REPAIR  09/7987   Umbilical  . NO PAST SURGERIES    . ORIF PATELLA Left 12/11/2019   Procedure: OPEN REDUCTION INTERNAL (ORIF) FIXATION PATELLA;  Surgeon: Robert Needles, MD;  Location: [place];  Service: Orthopedics;  Laterality: Left;    There were no vitals filed for this visit.   Subjective Assessment - 04/06/20 0951    Subjective COVID 19 screening performed on patient upon arrival. Patient arrived with ongoing tightness.    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 Yes    Pain Score 2     Pain Location Knee    Pain Orientation Left    Pain Descriptors / Indicators Sore    Pain Type Surgical pain    Pain Onset More than a month ago    Pain Frequency Intermittent    Aggravating Factors  bending knee and prolong activity    Pain  Relieving Factors rest              [facility] PT Assessment - 04/06/20 0001      AROM   AROM Assessment Site Knee    Right/Left Knee Left    Left Knee Flexion 102      PROM   PROM Assessment Site Knee    Right/Left Knee Left    Left Knee Flexion 111                         [facility] Adult PT Treatment/Exercise - 04/06/20 0001      Knee/Hip Exercises: Aerobic   Stationary Bike x5mn adjusted for ROM      Knee/Hip Exercises: Standing   Rocker Board --      Vasopneumatic   Number Minutes Vasopneumatic  15 minutes    Vasopnuematic Location  Knee    Vasopneumatic Pressure Medium    Vasopneumatic Temperature  34 for edema      Manual Therapy   Manual Therapy Passive ROM;Soft tissue mobilization    Manual therapy comments patella mobs to improve mobility    Passive ROM manual PROM for flexion in supine and ext with overpresure  PT Long Term Goals - 04/06/20 1030      PT LONG TERM GOAL #1   Title Patient will be independent with HEP    Time 8    Period Weeks    Status Achieved      PT LONG TERM GOAL #2   Title Patient will demonstrate 115+ degrees of left knee flexion AROM to improve ability to perform functional tasks.    Baseline AROM 102 degrees 04/06/20    Time 8    Period Weeks    Status On-going      PT LONG TERM GOAL #3   Title Patient will demonstrate 5 degrees or less of left knee extension AROM to improve gait mechanics.    Time 8    Period Weeks    Status Achieved      PT LONG TERM GOAL #4   Title Patient will ambulate community distances with minimal gait deviations and left knee pain less than or equal to 2/10.    Baseline met 04/06/20    Time 8    Period Weeks    Status Achieved      PT LONG TERM GOAL #5   Title Patient will report ability to perform ADLs and recreational activities with left knee pain less than or equal to 2/10.    Time 8    Period Weeks    Status Achieved                  Plan - 04/06/20 1003    Clinical Impression Statement Patient tolerated treatment fair due to increased tightness in knee. Patient continues to work out at the gym for strengthening. Today focused on ROM only to improve mobility. Patient ROM slightly less than last week. Patient able to walk with improved distance and little pain with minimal gait deviation.  Patient met LTG #4 with other goal ongoing due to mobility.    Personal Factors and Comorbidities Comorbidity 2    Comorbidities L patella ORIF 12/11/2019, hypothyroidism    Examination-Activity Limitations Squat;Stand;Locomotion Level;Bend    Stability/Clinical Decision Making Stable/Uncomplicated    Rehab Potential Good    PT Frequency 2x / week    PT Duration 8 weeks    PT Treatment/Interventions ADLs/Self Care Home Management;Cryotherapy;Electrical Stimulation;Moist Heat;Iontophoresis 48m/ml Dexamethasone;Gait training;Stair training;Functional mobility training;Therapeutic activities;Therapeutic exercise;Balance training;Manual techniques;Passive range of motion;Patient/family education;Vasopneumatic Device;Taping    PT Next Visit Plan cont with ROM 1 time a week until MD appt F/U 05/04/19    Consulted and Agree with Plan of Care Patient           Patient will benefit from skilled therapeutic intervention in order to improve the following deficits and impairments:  Abnormal gait,Decreased range of motion,Difficulty walking,Decreased activity tolerance,Decreased balance,Decreased strength,Increased edema,Pain  Visit Diagnosis: Stiffness of left knee, not elsewhere classified  Localized edema  Acute pain of left knee  Muscle weakness (generalized)     Problem List Patient Active Problem List   Diagnosis Date Noted  . Left patella fracture 12/10/2019  . Overweight (BMI 25.0-29.9) 09/20/2017  . Hypothyroidism following radioiodine therapy 03/11/2015  . Urinary frequency 12/29/2014  . Thoracic aorta  atherosclerosis ([place] 04/13/2014    Robert [name] P, PTA 04/06/2020, 10:38 AM  [facility] [address]Phone: [phone]  Fax:  [phone] Name: Robert ZollMRN: [id number] of Birth: [birth year]

## 2020-04-13 ENCOUNTER — Other Ambulatory Visit: Payer: Self-pay

## 2020-04-13 ENCOUNTER — Ambulatory Visit: Payer: Medicare HMO | Admitting: Physical Therapy

## 2020-04-13 DIAGNOSIS — M25662 Stiffness of left knee, not elsewhere classified: Secondary | ICD-10-CM | POA: Diagnosis not present

## 2020-04-13 DIAGNOSIS — M25562 Pain in left knee: Secondary | ICD-10-CM

## 2020-04-13 DIAGNOSIS — M6281 Muscle weakness (generalized): Secondary | ICD-10-CM | POA: Diagnosis not present

## 2020-04-13 DIAGNOSIS — R600 Localized edema: Secondary | ICD-10-CM | POA: Diagnosis not present

## 2020-04-13 NOTE — Therapy (Addendum)
[facility] [place], [place], [address] Phone: [phone]   Fax:  [phone]  Physical Therapy Treatment  Progress Note Reporting Period 03/08/2020 to 04/13/2020   See note below for Objective Data and Assessment of Progress/Goals.  Patient with improvements in ROM but still limited especially with flexion. Gabriela Eves, PT, DPT     Patient Details  Name: Robert [name] MRN: [id number] Date of Birth: Jun 28, 1955 Referring Provider (PT): Patrecia Pace, PA-C   Encounter Date: 04/13/2020   PT End of Session - 04/13/20 1001    Visit Number 20    Number of Visits 24    Date for PT Re-Evaluation 05/20/20    Authorization Type [employer] Medicare (CQ modifier, KX modifier after 15th visit) FOTO 20th score 85; PROGRESS NOTE EVERY 10TH VISIT    PT Start Time [phone]    PT Stop Time 1036    PT Time Calculation (min) 49 min    Activity Tolerance Patient tolerated treatment well    Behavior During Therapy WFL for tasks assessed/performed           Past Medical History:  Diagnosis Date  . Thyroid disease     Past Surgical History:  Procedure Laterality Date  . COLONOSCOPY  1980's   in [facility]  . HERNIA REPAIR  02/7251   Umbilical  . NO PAST SURGERIES    . ORIF PATELLA Left 12/11/2019   Procedure: OPEN REDUCTION INTERNAL (ORIF) FIXATION PATELLA;  Surgeon: Shona Needles, MD;  Location: [facility];  Service: Orthopedics;  Laterality: Left;    There were no vitals filed for this visit.   Subjective Assessment - 04/13/20 0958    Subjective COVID 19 screening performed on patient upon arrival. 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 Yes    Pain Score 2     Pain Location Knee    Pain Orientation  Left    Pain Descriptors / Indicators Tightness    Pain Type Surgical pain    Pain Onset More than a month ago    Pain Frequency Intermittent    Aggravating Factors  bending knee    Pain Relieving Factors at rest              [facility] PT Assessment - 04/13/20 0001      AROM   AROM Assessment Site Knee    Right/Left Knee Left    Left Knee Flexion 103      PROM   PROM Assessment Site Knee    Right/Left Knee Left    Left Knee Flexion 112                         [facility] Adult PT Treatment/Exercise - 04/13/20 0001      Knee/Hip Exercises: Aerobic   Stationary Bike x59mn adjusted for ROM      Vasopneumatic   Number Minutes Vasopneumatic  15 minutes    Vasopnuematic Location  Knee    Vasopneumatic Pressure Medium    Vasopneumatic Temperature  34 for edema      Manual Therapy   Manual Therapy Passive ROM;Soft tissue mobilization    Manual therapy comments patella mobs to improve mobility    Passive ROM manual PROM  for flexion in supine and ext with overpresure                       PT Long Term Goals - 04/13/20 1000      PT LONG TERM GOAL #1   Title Patient will be independent with HEP    Time 8    Period Weeks    Status Achieved      PT LONG TERM GOAL #2   Title Patient will demonstrate 115+ degrees of left knee flexion AROM to improve ability to perform functional tasks.    Baseline AROM 103 degrees 04/13/20    Time 8    Period Weeks    Status On-going      PT LONG TERM GOAL #3   Title Patient will demonstrate 5 degrees or less of left knee extension AROM to improve gait mechanics.    Time 8    Period Weeks    Status Achieved      PT LONG TERM GOAL #4   Title Patient will ambulate community distances with minimal gait deviations and left knee pain less than or equal to 2/10.    Baseline met 04/06/20    Time 8    Period Weeks    Status Achieved      PT LONG TERM GOAL #5   Title Patient will report ability to perform ADLs and  recreational activities with left knee pain less than or equal to 2/10.    Time 8    Period Weeks    Status Achieved                 Plan - 04/13/20 1027    Clinical Impression Statement Patient tolerated treatment well today. Today continued to focus on ROM to improve mobility. Patient able to improve ROM slightly today with ongoing tightness for left knee flexion. Patient has reported being able to perform ADL's with greater ease. Remaining goal ongoing.    Personal Factors and Comorbidities Comorbidity 2    Comorbidities L patella ORIF 12/11/2019, hypothyroidism    Examination-Activity Limitations Squat;Stand;Locomotion Level;Bend    Stability/Clinical Decision Making Stable/Uncomplicated    Rehab Potential Good    PT Frequency 2x / week    PT Duration 8 weeks    PT Treatment/Interventions ADLs/Self Care Home Management;Cryotherapy;Electrical Stimulation;Moist Heat;Iontophoresis 56m/ml Dexamethasone;Gait training;Stair training;Functional mobility training;Therapeutic activities;Therapeutic exercise;Balance training;Manual techniques;Passive range of motion;Patient/family education;Vasopneumatic Device;Taping    PT Next Visit Plan cont with ROM 1 time a week until MD appt F/U 05/04/19    Consulted and Agree with Plan of Care Patient           Patient will benefit from skilled therapeutic intervention in order to improve the following deficits and impairments:  Abnormal gait,Decreased range of motion,Difficulty walking,Decreased activity tolerance,Decreased balance,Decreased strength,Increased edema,Pain  Visit Diagnosis: Stiffness of left knee, not elsewhere classified  Localized edema  Acute pain of left knee  Muscle weakness (generalized)     Problem List Patient Active Problem List   Diagnosis Date Noted  . Left patella fracture 12/10/2019  . Overweight (BMI 25.0-29.9) 09/20/2017  . Hypothyroidism following radioiodine therapy 03/11/2015  . Urinary frequency  12/29/2014  . Thoracic aorta atherosclerosis ([facility] 04/13/2014    CLadean Raya PTA 04/13/20 10:39 AM  [facility] [address] [place] [address]Phone: [phone]  Fax:  [phone] Name: Robert KiddMRN: [id number] of Birth: 131-Jan-1957

## 2020-04-19 ENCOUNTER — Encounter: Payer: Self-pay | Admitting: Family Medicine

## 2020-04-19 ENCOUNTER — Other Ambulatory Visit: Payer: Self-pay

## 2020-04-19 ENCOUNTER — Ambulatory Visit (INDEPENDENT_AMBULATORY_CARE_PROVIDER_SITE_OTHER): Payer: Medicare HMO | Admitting: Family Medicine

## 2020-04-19 VITALS — BP 131/84 | HR 70 | Temp 97.3°F | Ht 70.0 in | Wt 203.6 lb

## 2020-04-19 DIAGNOSIS — Z13220 Encounter for screening for lipoid disorders: Secondary | ICD-10-CM

## 2020-04-19 DIAGNOSIS — Z131 Encounter for screening for diabetes mellitus: Secondary | ICD-10-CM | POA: Diagnosis not present

## 2020-04-19 DIAGNOSIS — E890 Postprocedural hypothyroidism: Secondary | ICD-10-CM

## 2020-04-19 DIAGNOSIS — E663 Overweight: Secondary | ICD-10-CM | POA: Diagnosis not present

## 2020-04-19 NOTE — Progress Notes (Signed)
BP 131/84   Pulse 70   Temp (!) 97.3 F (36.3 C) (Temporal)   Ht 5' 10" (1.778 m)   Wt 203 lb 9.6 oz (92.4 kg)   BMI 29.21 kg/m    Subjective:   Patient ID: Robert [name], male    DOB: 03/28/1955, 65 y.o.   MRN: [id number]  HPI: Robert [name] is a 65 y.o. male presenting on 04/19/2020 for Medical Management of Chronic Issues and Hypothyroidism   HPI Hypothyroidism recheck Patient is coming in for thyroid recheck today as well. They deny any issues with hair changes or heat or cold problems or diarrhea or constipation. They deny any chest pain or palpitations. They are currently on levothyroxine 185mcrograms   Patient is overweight and upset that he has gained some weight with his knee fracture, he is working to get back.  Relevant past medical, surgical, family and social history reviewed and updated as indicated. Interim medical history since our last visit reviewed. Allergies and medications reviewed and updated.  Review of Systems  Constitutional: Positive for unexpected weight change. Negative for chills and fever.  Respiratory: Negative for shortness of breath and wheezing.   Cardiovascular: Negative for chest pain and leg swelling.  Musculoskeletal: Positive for arthralgias. Negative for back pain and gait problem.  Skin: Negative for rash.  All other systems reviewed and are negative.   Per HPI unless specifically indicated above   Allergies as of 04/19/2020   No Known Allergies     Medication List       Accurate as of April 19, 2020  8:02 AM. If you have any questions, ask your nurse or doctor.        STOP taking these medications   methocarbamol 500 MG tablet Commonly known as: ROBAXIN Stopped by: JFransisca KaufmannDettinger, MD   ondansetron 4 MG tablet Commonly known as: ZOFRAN Stopped by: JFransisca KaufmannDettinger, MD   predniSONE 10 MG tablet Commonly known as: DELTASONE Stopped by: JFransisca KaufmannDettinger, MD     TAKE these medications   acetaminophen 325 MG  tablet Commonly known as: TYLENOL Take 650 mg by mouth every 6 (six) hours as needed.   atorvastatin 20 MG tablet Commonly known as: LIPITOR Take 20 mg by mouth at bedtime.   fluticasone 50 MCG/ACT nasal spray Commonly known as: FLONASE Place 1 spray into both nostrils 2 (two) times daily as needed for allergies or rhinitis.   ibuprofen 200 MG tablet Commonly known as: ADVIL Take 200 mg by mouth every 6 (six) hours as needed.   levothyroxine 100 MCG tablet Commonly known as: SYNTHROID TAKE 1 TABLET BY MOUTH EVERY DAY   ONE-A-DAY MENS 50+ ADVANTAGE PO Take 1 tablet by mouth daily.   oxyCODONE-acetaminophen 5-325 MG tablet Commonly known as: Percocet Take 1 tablet by mouth every 4 (four) hours as needed for severe pain.        Objective:   BP 131/84   Pulse 70   Temp (!) 97.3 F (36.3 C) (Temporal)   Ht 5' 10" (1.778 m)   Wt 203 lb 9.6 oz (92.4 kg)   BMI 29.21 kg/m   Wt Readings from Last 3 Encounters:  04/19/20 203 lb 9.6 oz (92.4 kg)  02/11/20 194 lb 8 oz (88.2 kg)  10/21/19 187 lb (84.8 kg)    Physical Exam Vitals and nursing note reviewed.  Constitutional:      General: He is not in acute distress.    Appearance: He is well-developed. He is  not diaphoretic.  Eyes:     General: No scleral icterus.    Conjunctiva/sclera: Conjunctivae normal.  Neck:     Thyroid: No thyromegaly.  Cardiovascular:     Rate and Rhythm: Normal rate and regular rhythm.     Heart sounds: Normal heart sounds. No murmur heard.   Pulmonary:     Effort: Pulmonary effort is normal. No respiratory distress.     Breath sounds: Normal breath sounds. No wheezing.  Musculoskeletal:        General: Normal range of motion.     Cervical back: Neck supple.  Lymphadenopathy:     Cervical: No cervical adenopathy.  Skin:    General: Skin is warm and dry.     Findings: No rash.  Neurological:     Mental Status: He is alert and oriented to person, place, and time.     Coordination:  Coordination normal.  Psychiatric:        Behavior: Behavior normal.       Assessment & Plan:   Problem List Items Addressed This Visit      Endocrine   Hypothyroidism following radioiodine therapy - Primary   Relevant Orders   CBC with Differential/Platelet   CMP14+EGFR   TSH     Other   Overweight (BMI 25.0-29.9)   Relevant Orders   CMP14+EGFR   Lipid panel    Other Visit Diagnoses    Diabetes mellitus screening       Relevant Orders   CMP14+EGFR   Lipid screening       Relevant Orders   CMP14+EGFR   Lipid panel      Patient seems to be doing well blood pressure, did have an a fall and fractured his patella but is doing rehab and recovery and doing okay with them.  No change in medication, will check blood work. Follow up plan: Return in about 6 months (around 10/20/2020), or if symptoms worsen or fail to improve, for Thyroid.  Counseling provided for all of the vaccine components No orders of the defined types were placed in this encounter.   Caryl Pina, MD [facility] 04/19/2020, 8:02 AM

## 2020-04-20 ENCOUNTER — Ambulatory Visit: Payer: Medicare HMO | Admitting: Physical Therapy

## 2020-04-20 ENCOUNTER — Other Ambulatory Visit: Payer: Self-pay

## 2020-04-20 DIAGNOSIS — M6281 Muscle weakness (generalized): Secondary | ICD-10-CM | POA: Diagnosis not present

## 2020-04-20 DIAGNOSIS — R600 Localized edema: Secondary | ICD-10-CM | POA: Diagnosis not present

## 2020-04-20 DIAGNOSIS — M25662 Stiffness of left knee, not elsewhere classified: Secondary | ICD-10-CM | POA: Diagnosis not present

## 2020-04-20 DIAGNOSIS — M25562 Pain in left knee: Secondary | ICD-10-CM

## 2020-04-20 LAB — CBC WITH DIFFERENTIAL/PLATELET
Basophils Absolute: 0 10*3/uL (ref 0.0–0.2)
Basos: 0 %
EOS (ABSOLUTE): 0.2 10*3/uL (ref 0.0–0.4)
Eos: 5 %
Hematocrit: 41.2 % (ref 37.5–51.0)
Hemoglobin: 13.5 g/dL (ref 13.0–17.7)
Immature Grans (Abs): 0 10*3/uL (ref 0.0–0.1)
Immature Granulocytes: 0 %
Lymphocytes Absolute: 1.4 10*3/uL (ref 0.7–3.1)
Lymphs: 30 %
MCH: 29.3 pg (ref 26.6–33.0)
MCHC: 32.8 g/dL (ref 31.5–35.7)
MCV: 90 fL (ref 79–97)
Monocytes Absolute: 0.4 10*3/uL (ref 0.1–0.9)
Monocytes: 8 %
Neutrophils Absolute: 2.6 10*3/uL (ref 1.4–7.0)
Neutrophils: 57 %
Platelets: 177 10*3/uL (ref 150–450)
RBC: 4.6 x10E6/uL (ref 4.14–5.80)
RDW: 14.8 % (ref 11.6–15.4)
WBC: 4.5 10*3/uL (ref 3.4–10.8)

## 2020-04-20 LAB — LIPID PANEL
Chol/HDL Ratio: 2.6 ratio (ref 0.0–5.0)
Cholesterol, Total: 125 mg/dL (ref 100–199)
HDL: 48 mg/dL (ref >39)
LDL Chol Calc (NIH): 63 mg/dL (ref 0–99)
Triglycerides: 69 mg/dL (ref 0–149)
VLDL Cholesterol Cal: 14 mg/dL (ref 5–40)

## 2020-04-20 LAB — CMP14+EGFR
ALT: 24 IU/L (ref 0–44)
AST: 38 IU/L (ref 0–40)
Albumin/Globulin Ratio: 1.2 (ref 1.2–2.2)
Albumin: 4.6 g/dL (ref 3.8–4.8)
Alkaline Phosphatase: 85 IU/L (ref 44–121)
BUN/Creatinine Ratio: 8 — ABNORMAL LOW (ref 10–24)
BUN: 9 mg/dL (ref 8–27)
Bilirubin Total: 0.6 mg/dL (ref 0.0–1.2)
CO2: 23 mmol/L (ref 20–29)
Calcium: 9.2 mg/dL (ref 8.6–10.2)
Chloride: 100 mmol/L (ref 96–106)
Creatinine, Ser: 1.16 mg/dL (ref 0.76–1.27)
Globulin, Total: 3.7 g/dL (ref 1.5–4.5)
Glucose: 98 mg/dL (ref 65–99)
Potassium: 4 mmol/L (ref 3.5–5.2)
Sodium: 140 mmol/L (ref 134–144)
Total Protein: 8.3 g/dL (ref 6.0–8.5)
eGFR: 69 mL/min/{1.73_m2} (ref >59)

## 2020-04-20 LAB — TSH: TSH: 16.6 u[IU]/mL — ABNORMAL HIGH (ref 0.450–4.500)

## 2020-04-20 NOTE — Therapy (Signed)
Robert [name], [place], [address] Phone: [phone]   Fax:  [phone]  Physical Therapy Treatment  Patient Details  Name: Robert [name] MRN: [id number] Date of Birth: [birth year] Referring Provider (PT): Patrecia Pace, PA-C   Encounter Date: 04/20/2020   PT End of Session - 04/20/20 1027    Visit Number 21    Number of Visits 24    Date for PT Re-Evaluation 05/20/20    Authorization Type A[employer]na Medicare (CQ modifier, KX modifier after 15th visit) FOTO 20th score 85; PROGRESS NOTE EVERY 10TH VISIT    PT Start Time 0945    PT Stop Time 1036    PT Time Calculation (min) 51 min    Activity Tolerance Patient tolerated treatment well    Behavior During Therapy WFL for tasks assessed/performed           Past Medical History:  Diagnosis Date  . Thyroid disease     Past Surgical History:  Procedure Laterality Date  . COLONOSCOPY  1980's   in [facility]  . HERNIA REPAIR  03/1592   Umbilical  . NO PAST SURGERIES    . ORIF PATELLA Left 12/11/2019   Procedure: OPEN REDUCTION INTERNAL (ORIF) FIXATION PATELLA;  Surgeon: Shona Needles, MD;  Location: [facility];  Service: Orthopedics;  Laterality: Left;    There were no vitals filed for this visit.   Subjective Assessment - 04/20/20 0956    Subjective COVID 19 screening performed on patient upon arrival. 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 Yes    Pain Score 2     Pain Location Knee    Pain Orientation Left    Pain Descriptors / Indicators Tightness    Pain Type Surgical pain    Pain Onset More than a month ago    Pain Frequency Intermittent    Aggravating Factors  bending knee    Pain  Relieving Factors rest              [facility] PT Assessment - 04/20/20 0001      AROM   AROM Assessment Site Knee    Right/Left Knee Left    Left Knee Flexion 105      PROM   PROM Assessment Site Knee    Right/Left Knee Left    Left Knee Flexion 114                         [facility] Adult PT Treatment/Exercise - 04/20/20 0001      Knee/Hip Exercises: Aerobic   Stationary Bike x69mn adjusted for ROM      Vasopneumatic   Number Minutes Vasopneumatic  15 minutes    Vasopnuematic Location  Knee    Vasopneumatic Pressure Medium    Vasopneumatic Temperature  34 for edema      Manual Therapy   Manual Therapy Passive ROM;Soft tissue mobilization    Manual therapy comments patella mobs to improve mobility    Passive ROM manual PROM for flexion in supine and ext with overpresure                       PT Long Term Goals -  04/20/20 1021      PT LONG TERM GOAL #1   Title Patient will be independent with HEP    Time 8    Period Weeks    Status Achieved      PT LONG TERM GOAL #2   Title Patient will demonstrate 115+ degrees of left knee flexion AROM to improve ability to perform functional tasks.    Baseline AROM 105 degrees 04/20/20    Time 8    Period Weeks    Status On-going      PT LONG TERM GOAL #3   Title Patient will demonstrate 5 degrees or less of left knee extension AROM to improve gait mechanics.    Time 8    Period Weeks    Status Achieved      PT LONG TERM GOAL #4   Title Patient will ambulate community distances with minimal gait deviations and left knee pain less than or equal to 2/10.    Baseline met 04/06/20    Time 8    Period Weeks    Status Achieved      PT LONG TERM GOAL #5   Title Patient will report ability to perform ADLs and recreational activities with left knee pain less than or equal to 2/10.    Time 8    Period Weeks    Status Achieved                 Plan - 04/20/20 1022    Clinical Impression  Statement Patient tolerated treatment well today. Patient continues to have tightness in right knee yet able to improve each week active and passively. Patient continues to go to the gym several times a week for strengthening and stretching. remaining goal progressing.    Personal Factors and Comorbidities Comorbidity 2    Comorbidities L patella ORIF 12/11/2019, hypothyroidism    Examination-Activity Limitations Squat;Stand;Locomotion Level;Bend    Stability/Clinical Decision Making Stable/Uncomplicated    Rehab Potential Good    PT Frequency 2x / week    PT Duration 8 weeks    PT Treatment/Interventions ADLs/Self Care Home Management;Cryotherapy;Electrical Stimulation;Moist Heat;Iontophoresis 54m/ml Dexamethasone;Gait training;Stair training;Functional mobility training;Therapeutic activities;Therapeutic exercise;Balance training;Manual techniques;Passive range of motion;Patient/family education;Vasopneumatic Device;Taping    PT Next Visit Plan cont with ROM 1 time a week until MD appt F/U 05/04/19    Consulted and Agree with Plan of Care Patient           Patient will benefit from skilled therapeutic intervention in order to improve the following deficits and impairments:  Abnormal gait,Decreased range of motion,Difficulty walking,Decreased activity tolerance,Decreased balance,Decreased strength,Increased edema,Pain  Visit Diagnosis: Stiffness of left knee, not elsewhere classified  Localized edema  Acute pain of left knee  Muscle weakness (generalized)     Problem List Patient Active Problem List   Diagnosis Date Noted  . Left patella fracture 12/10/2019  . Overweight (BMI 25.0-29.9) 09/20/2017  . Hypothyroidism following radioiodine therapy 03/11/2015  . Urinary frequency 12/29/2014  . [place]    Oris Staffieri P, PTA 04/20/2020, 10:37 AM  [facility]  [place] [address]Phone: [phone]  Fax:  [phone] Name: Robert KozakMRN: [id number] of Birth: 111/08/1955

## 2020-04-26 ENCOUNTER — Telehealth: Payer: Self-pay | Admitting: *Deleted

## 2020-04-26 ENCOUNTER — Other Ambulatory Visit: Payer: Self-pay | Admitting: *Deleted

## 2020-04-26 DIAGNOSIS — R7989 Other specified abnormal findings of blood chemistry: Secondary | ICD-10-CM

## 2020-04-26 NOTE — Telephone Encounter (Signed)
Pt called ad wanted his TSH results from 7 days ago. They had not been viewed by PCP, so I had Dr Nadine Counts to review. Pt is to come in tomorrow for another TSH and free T4. Orders placed

## 2020-04-27 ENCOUNTER — Other Ambulatory Visit: Payer: Medicare HMO

## 2020-04-27 ENCOUNTER — Encounter: Payer: Self-pay | Admitting: Nurse Practitioner

## 2020-04-27 ENCOUNTER — Encounter: Payer: Self-pay | Admitting: Physical Therapy

## 2020-04-27 ENCOUNTER — Ambulatory Visit: Payer: Medicare HMO | Attending: Student | Admitting: Physical Therapy

## 2020-04-27 ENCOUNTER — Other Ambulatory Visit: Payer: Self-pay

## 2020-04-27 ENCOUNTER — Ambulatory Visit (INDEPENDENT_AMBULATORY_CARE_PROVIDER_SITE_OTHER): Payer: Medicare HMO | Admitting: Nurse Practitioner

## 2020-04-27 VITALS — BP 114/75 | HR 68 | Temp 97.8°F | Ht 70.0 in | Wt 199.0 lb

## 2020-04-27 DIAGNOSIS — R600 Localized edema: Secondary | ICD-10-CM | POA: Insufficient documentation

## 2020-04-27 DIAGNOSIS — R7989 Other specified abnormal findings of blood chemistry: Secondary | ICD-10-CM | POA: Diagnosis not present

## 2020-04-27 DIAGNOSIS — M6281 Muscle weakness (generalized): Secondary | ICD-10-CM | POA: Insufficient documentation

## 2020-04-27 DIAGNOSIS — M25562 Pain in left knee: Secondary | ICD-10-CM | POA: Diagnosis not present

## 2020-04-27 DIAGNOSIS — M25662 Stiffness of left knee, not elsewhere classified: Secondary | ICD-10-CM | POA: Insufficient documentation

## 2020-04-27 DIAGNOSIS — J0110 Acute frontal sinusitis, unspecified: Secondary | ICD-10-CM | POA: Insufficient documentation

## 2020-04-27 DIAGNOSIS — R6889 Other general symptoms and signs: Secondary | ICD-10-CM | POA: Diagnosis not present

## 2020-04-27 MED ORDER — FLUTICASONE PROPIONATE 50 MCG/ACT NA SUSP: 2.0000 | Freq: Every day | NASAL | 6 refills | Status: DC

## 2020-04-27 MED ORDER — BENZONATATE 100 MG PO CAPS: 100.0000 mg | ORAL_CAPSULE | Freq: Three times a day (TID) | ORAL | 0 refills | Status: DC | PRN

## 2020-04-27 MED ORDER — AZITHROMYCIN 250 MG PO TABS: ORAL_TABLET | ORAL | 0 refills | Status: DC

## 2020-04-27 NOTE — Progress Notes (Signed)
Acute Office Visit  Subjective:    Patient ID: Robert [name], male    DOB: 1955/09/21, 65 y.o.   MRN: [id number]  Chief Complaint  Patient presents with  . Sinusitis    Sinusitis This is a recurrent problem. The current episode started in the past 7 days. The problem is unchanged. There has been no fever. The pain is moderate. Associated symptoms include coughing, sinus pressure and sneezing. Pertinent negatives include no chills, ear pain, headaches, shortness of breath, sore throat or swollen glands. Past treatments include spray decongestants (Antihistamine). The treatment provided no relief.     Past Medical History:  Diagnosis Date  . Thyroid disease     Past Surgical History:  Procedure Laterality Date  . COLONOSCOPY  1980's   in [facility]  . HERNIA REPAIR  03/2014   Umbilical  . NO PAST SURGERIES    . ORIF PATELLA Left 12/11/2019   Procedure: OPEN REDUCTION INTERNAL (ORIF) FIXATION PATELLA;  Surgeon: Roby Lofts, MD;  Location[facility];  Service: Orthopedics;  Laterality: Left;    Family History  Problem Relation Age of Onset  . Heart Problems Mother   . Suicidality Father   . Thyroid disease Neg Hx   . Colon cancer Neg Hx     Social History   Socioeconomic History  . Marital status: Single    Spouse name: Not on file  . Number of children: Not on file  . Years of education: Not on file  . Highest education level: Not on file  Occupational History  . Occupation: administer  Tobacco Use  . Smoking status: Former Smoker    Packs/day: 0.25    Types: Cigarettes    Start date: 12/08/2000    Quit date: 12/09/2010    Years since quitting: 9.3  . Smokeless tobacco: Never Used  Vaping Use  . Vaping Use: Never used  Substance and Sexual Activity  . Alcohol use: Never  . Drug use: Never  . Sexual activity: Not Currently  Other Topics Concern  . Not on file  Social History Narrative   ** Merged History Encounter **       Social Determinants of  Health   Financial Resource Strain: Not on file  Food Insecurity: Not on file  Transportation Needs: Not on file  Physical Activity: Not on file  Stress: Not on file  Social Connections: Not on file  Intimate Partner Violence: Not on file    Outpatient Medications Prior to Visit  Medication Sig Dispense Refill  . acetaminophen (TYLENOL) 325 MG tablet Take 650 mg by mouth every 6 (six) hours as needed.    Marland Kitchen atorvastatin (LIPITOR) 20 MG tablet Take 20 mg by mouth at bedtime.     . fluticasone (FLONASE) 50 MCG/ACT nasal spray Place 1 spray into both nostrils 2 (two) times daily as needed for allergies or rhinitis. 16 g 6  . ibuprofen (ADVIL) 200 MG tablet Take 200 mg by mouth every 6 (six) hours as needed.    Marland Kitchen levothyroxine (SYNTHROID) 100 MCG tablet TAKE 1 TABLET BY MOUTH EVERY DAY 90 tablet 2  . Multiple Vitamins-Minerals (ONE-A-DAY MENS 50+ ADVANTAGE PO) Take 1 tablet by mouth daily.    Marland Kitchen oxyCODONE-acetaminophen (PERCOCET) 5-325 MG tablet Take 1 tablet by mouth every 4 (four) hours as needed for severe pain. 42 tablet 0   No facility-administered medications prior to visit.    No Known Allergies  Review of Systems  Constitutional: Negative.  Negative for chills.  HENT: Positive for sinus pressure and sneezing. Negative for ear pain and sore throat.   Eyes: Negative.   Respiratory: Positive for cough. Negative for shortness of breath and wheezing.   Cardiovascular: Negative.   Genitourinary: Negative.   Musculoskeletal: Negative.   Neurological: Negative for headaches.  All other systems reviewed and are negative.      Objective:    Physical Exam Vitals reviewed.  Constitutional:      Appearance: Normal appearance.  HENT:     Head: Normocephalic.     Nose: Congestion present.  Eyes:     Conjunctiva/sclera: Conjunctivae normal.  Cardiovascular:     Rate and Rhythm: Normal rate and regular rhythm.     Pulses: Normal pulses.     Heart sounds: Normal heart sounds.   Pulmonary:     Effort: Pulmonary effort is normal.     Breath sounds: Normal breath sounds.     Comments: Cough Abdominal:     General: Bowel sounds are normal.  Musculoskeletal:     Cervical back: Normal range of motion.  Skin:    General: Skin is warm.  Neurological:     Mental Status: He is alert and oriented to person, place, and time.  Psychiatric:        Behavior: Behavior normal.     There were no vitals taken for this visit. Wt Readings from Last 3 Encounters:  04/19/20 203 lb 9.6 oz (92.4 kg)  02/11/20 194 lb 8 oz (88.2 kg)  10/21/19 187 lb (84.8 kg)      Lab Results  Component Value Date   TSH 16.600 (H) 04/19/2020   Lab Results  Component Value Date   WBC 4.5 04/19/2020   HGB 13.5 04/19/2020   HCT 41.2 04/19/2020   MCV 90 04/19/2020   PLT 177 04/19/2020   Lab Results  Component Value Date   NA 140 04/19/2020   K 4.0 04/19/2020   CO2 23 04/19/2020   GLUCOSE 98 04/19/2020   BUN 9 04/19/2020   CREATININE 1.16 04/19/2020   BILITOT 0.6 04/19/2020   ALKPHOS 85 04/19/2020   AST 38 04/19/2020   ALT 24 04/19/2020   PROT 8.3 04/19/2020   ALBUMIN 4.6 04/19/2020   CALCIUM 9.2 04/19/2020   ANIONGAP 10 12/10/2019   Lab Results  Component Value Date   CHOL 125 04/19/2020   Lab Results  Component Value Date   HDL 48 04/19/2020   Lab Results  Component Value Date   LDLCALC 63 04/19/2020   Lab Results  Component Value Date   TRIG 69 04/19/2020   Lab Results  Component Value Date   CHOLHDL 2.6 04/19/2020   No results found for: HGBA1C     Assessment & Plan:   Problem List Items Addressed This Visit      Respiratory   Subacute frontal sinusitis - Primary    Symptoms not well controlled, worsened by seasonal allergies.  Patient has used Allegra, and Flonase in the past with no therapeutic effect.  Patient is reporting worsening congestion and cough.   Benzonatate for cough, Zithromax and Flonase sent to pharmacy.    Education provided to  patient with printed handouts given.  Follow-up with worsening unresolved symptoms.        Relevant Medications   fluticasone (FLONASE) 50 MCG/ACT nasal spray   azithromycin (ZITHROMAX) 250 MG tablet   benzonatate (TESSALON PERLES) 100 MG capsule       Meds ordered this encounter  Medications  . fluticasone (FLONASE)  50 MCG/ACT nasal spray    Sig: Place 2 sprays into both nostrils daily.    Dispense:  16 g    Refill:  6    Order Specific Question:   Supervising Provider    Answer:   Raliegh Ip [[id number]]  . azithromycin (ZITHROMAX) 250 MG tablet    Sig: 2 tablets 500 mg tablet day 1, 1 tablet 250 mg tablet day 2-5.    Dispense:  1 each    Refill:  0    Order Specific Question:   Supervising Provider    Answer:   Raliegh Ip [[id number]]  . benzonatate (TESSALON PERLES) 100 MG capsule    Sig: Take 1 capsule (100 mg total) by mouth 3 (three) times daily as needed for cough.    Dispense:  20 capsule    Refill:  0    Order Specific Question:   Supervising Provider    Answer:   Raliegh Ip [[id number]]     Daryll Drown, NP

## 2020-04-27 NOTE — Assessment & Plan Note (Signed)
Symptoms not well controlled, worsened by seasonal allergies.  Patient has used Allegra, and Flonase in the past with no therapeutic effect.  Patient is reporting worsening congestion and cough.   Benzonatate for cough, Zithromax and Flonase sent to pharmacy.    Education provided to patient with printed handouts given.  Follow-up with worsening unresolved symptoms.

## 2020-04-27 NOTE — Patient Instructions (Signed)

## 2020-04-27 NOTE — Therapy (Signed)
[facility] [place], [place], [address] Phone: [phone]   Fax:  [phone]  Physical Therapy Treatment  Patient Details  Name: Robert [name] MRN: [id number] Date of Birth: 09/18/1955 Referring Provider (PT): Patrecia Pace, PA-C   Encounter Date: 04/27/2020   PT End of Session - 04/27/20 0949    Visit Number 22    Number of Visits 24    Date for PT Re-Evaluation 05/20/20    Authorization Type [employer] Medicare (CQ modifier, KX modifier after 15th visit) FOTO 20th score 85; PROGRESS NOTE EVERY 10TH VISIT    PT Start Time 0945    PT Stop Time 1038    PT Time Calculation (min) 53 min    Activity Tolerance Patient tolerated treatment well    Behavior During Therapy [facility] for tasks assessed/performed           Past Medical History:  Diagnosis Date  . Thyroid disease     Past Surgical History:  Procedure Laterality Date  . COLONOSCOPY  1980's   in [facility]  . HERNIA REPAIR  05/3662   Umbilical  . NO PAST SURGERIES    . ORIF PATELLA Left 12/11/2019   Procedure: OPEN REDUCTION INTERNAL (ORIF) FIXATION PATELLA;  Surgeon: Shona Needles, MD;  Location: [place];  Service: Orthopedics;  Laterality: Left;    There were no vitals filed for this visit.   Subjective Assessment - 04/27/20 0948    Subjective COVID 19 screening performed on patient upon arrival. Patient reports doing fairly well but with some sharp pains in the knee.    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 Yes    Pain Score 1     Pain Location Knee    Pain Orientation Left    Pain Descriptors / Indicators Sore    Pain Type Surgical pain    Pain Onset More than a month ago    Pain Frequency Intermittent              [facility] PT  Assessment - 04/27/20 0001      Assessment   Medical Diagnosis L patella fracture, S/P ORIF    Referring Provider (PT) Patrecia Pace, PA-C    Onset Date/Surgical Date 12/11/19    Next MD Visit 05/03/2020    Prior Therapy no      AROM   Left Knee Extension 3    Left Knee Flexion 115      PROM   Left Knee Extension 0    Left Knee Flexion 118                         [facility] Adult PT Treatment/Exercise - 04/27/20 0001      Knee/Hip Exercises: Aerobic   Stationary Bike x8mn adjusted for ROM      Vasopneumatic   Number Minutes Vasopneumatic  15 minutes    Vasopnuematic Location  Knee    Vasopneumatic Pressure Medium    Vasopneumatic Temperature  34 for edema      Manual Therapy   Manual Therapy Passive ROM;Soft tissue mobilization    Manual therapy comments patella mobs to improve mobility    Passive ROM manual PROM for flexion and ext with overpresure and holds to improve ROM in  sitting and supine                       PT Long Term Goals - 04/27/20 1310      PT LONG TERM GOAL #1   Title Patient will be independent with HEP    Time 8    Period Weeks    Status Achieved      PT LONG TERM GOAL #2   Title Patient will demonstrate 115+ degrees of left knee flexion AROM to improve ability to perform functional tasks.    Baseline 115 degrees    Period Weeks    Status Achieved      PT LONG TERM GOAL #3   Title Patient will demonstrate 5 degrees or less of left knee extension AROM to improve gait mechanics.    Time 8    Period Weeks    Status Achieved      PT LONG TERM GOAL #4   Title Patient will ambulate community distances with minimal gait deviations and left knee pain less than or equal to 2/10.    Baseline met 04/06/20    Time 8    Period Weeks    Status Achieved      PT LONG TERM GOAL #5   Title Patient will report ability to perform ADLs and recreational activities with left knee pain less than or equal to 2/10.    Time 8    Period  Weeks    Status Achieved                 Plan - 04/27/20 1030    Clinical Impression Statement Patient arrives doing well with 1/10 pain in left inferior patella knee pain. Patient reponded well to PROM into knee flexion and extension but with some reports of pain particularly with knee flexion PROM. Patient has achieved all goals set at evaluation and will be put on hold until MD appointment. Patinet instructed to continue to be consistent with HEP and gym program to maintain strength and ROM. No adverse affects upon removal of modalities.    Personal Factors and Comorbidities Comorbidity 2    Comorbidities L patella ORIF 12/11/2019, hypothyroidism    Examination-Activity Limitations Squat;Stand;Locomotion Level;Bend    Stability/Clinical Decision Making Stable/Uncomplicated    Clinical Decision Making Low    Rehab Potential Good    PT Frequency 2x / week    PT Duration 8 weeks    PT Treatment/Interventions ADLs/Self Care Home Management;Cryotherapy;Electrical Stimulation;Moist Heat;Iontophoresis 24m/ml Dexamethasone;Gait training;Stair training;Functional mobility training;Therapeutic activities;Therapeutic exercise;Balance training;Manual techniques;Passive range of motion;Patient/family education;Vasopneumatic Device;Taping    PT Next Visit Plan Hold until MD appointment on Tuesday 05/03/2020    PT Home Exercise Plan see patient education section    Consulted and Agree with Plan of Care Patient           Patient will benefit from skilled therapeutic intervention in order to improve the following deficits and impairments:  Abnormal gait,Decreased range of motion,Difficulty walking,Decreased activity tolerance,Decreased balance,Decreased strength,Increased edema,Pain  Visit Diagnosis: Stiffness of left knee, not elsewhere classified  Localized edema  Acute pain of left knee  Muscle weakness (generalized)     Problem List Patient Active Problem List   Diagnosis Date  Noted  . Subacute frontal sinusitis 04/27/2020  . Left patella fracture 12/10/2019  . Overweight (BMI 25.0-29.9) 09/20/2017  . Hypothyroidism following radioiodine therapy 03/11/2015  . Urinary frequency 12/29/2014  . Thoracic aorta atherosclerosis ([place] 04/13/2014    KGabriela Eves  PT, DPT 04/27/2020, 1:13 PM  [facility] [place], [place], [address] Phone: [phone]   Fax:  [phone]  Name: Maxamillion Banas MRN: [id number] Date of Birth: [birth year]

## 2020-04-28 ENCOUNTER — Other Ambulatory Visit: Payer: Self-pay | Admitting: Family Medicine

## 2020-04-28 ENCOUNTER — Telehealth: Payer: Self-pay

## 2020-04-28 LAB — TSH: TSH: 7.99 u[IU]/mL — ABNORMAL HIGH (ref 0.450–4.500)

## 2020-04-28 LAB — T4, FREE: Free T4: 1.24 ng/dL (ref 0.82–1.77)

## 2020-04-28 NOTE — Telephone Encounter (Signed)
Had he been taking the cholesterol pills already, if he had been done that is why, we just wanted to continue them because levels were good.  As far as his thyroid it still shows that he is low, I do not know what dose he has currently but I want him to go with a higher dose, I think we were trying to increase the last time I saw him but I want him to go with the higher the 2 doses if he is not already taking it.  If he is taking it let me know.

## 2020-04-28 NOTE — Telephone Encounter (Signed)
Pt wants Dr Dettinger to look at his lab results that were rechecked yesterday to see what he thinks.Marland Kitchen also wants to know why Dr Dettinger sent him in medicine for cholesterol when his cholesterol levels were good on 04/19/20.Marland Kitchen please advise and call patient.

## 2020-04-28 NOTE — Telephone Encounter (Signed)
Pt aware to continue taking his cholesterol medication. States that he is taking of Levothyroxine.

## 2020-04-29 ENCOUNTER — Other Ambulatory Visit: Payer: Self-pay | Admitting: Family Medicine

## 2020-04-29 MED ORDER — LEVOTHYROXINE SODIUM 112 MCG PO TABS: 112.0000 ug | ORAL_TABLET | Freq: Every day | ORAL | 3 refills | Status: DC

## 2020-04-29 NOTE — Progress Notes (Signed)
Pt is aware of rx sent into the pharmacy.

## 2020-04-29 NOTE — Progress Notes (Signed)
Sent a prescription for levothyroxine 112 mcg for the patient

## 2020-05-03 DIAGNOSIS — S82002D Unspecified fracture of left patella, subsequent encounter for closed fracture with routine healing: Secondary | ICD-10-CM | POA: Diagnosis not present

## 2020-05-05 ENCOUNTER — Other Ambulatory Visit: Payer: Self-pay | Admitting: Student

## 2020-05-05 DIAGNOSIS — S82002D Unspecified fracture of left patella, subsequent encounter for closed fracture with routine healing: Secondary | ICD-10-CM

## 2020-05-14 ENCOUNTER — Ambulatory Visit
Admission: RE | Admit: 2020-05-14 | Discharge: 2020-05-14 | Disposition: A | Discharge status: Home / Self Care | Payer: Medicare HMO | Source: Ambulatory Visit | Attending: Student | Admitting: Student

## 2020-05-14 DIAGNOSIS — S82002A Unspecified fracture of left patella, initial encounter for closed fracture: Secondary | ICD-10-CM | POA: Diagnosis not present

## 2020-05-14 DIAGNOSIS — S82002D Unspecified fracture of left patella, subsequent encounter for closed fracture with routine healing: Secondary | ICD-10-CM

## 2020-05-14 DIAGNOSIS — M25462 Effusion, left knee: Secondary | ICD-10-CM | POA: Diagnosis not present

## 2020-05-23 DIAGNOSIS — S82002D Unspecified fracture of left patella, subsequent encounter for closed fracture with routine healing: Secondary | ICD-10-CM | POA: Diagnosis not present

## 2020-06-02 ENCOUNTER — Encounter: Payer: Self-pay | Admitting: Family Medicine

## 2020-06-02 ENCOUNTER — Other Ambulatory Visit: Payer: Self-pay

## 2020-06-02 ENCOUNTER — Ambulatory Visit (INDEPENDENT_AMBULATORY_CARE_PROVIDER_SITE_OTHER): Payer: Medicare HMO | Admitting: Family Medicine

## 2020-06-02 VITALS — BP 125/77 | HR 82 | Ht 70.0 in | Wt 194.0 lb

## 2020-06-02 DIAGNOSIS — N492 Inflammatory disorders of scrotum: Secondary | ICD-10-CM

## 2020-06-02 DIAGNOSIS — J309 Allergic rhinitis, unspecified: Secondary | ICD-10-CM

## 2020-06-02 MED ORDER — PREDNISONE 20 MG PO TABS: ORAL_TABLET | ORAL | 0 refills | Status: DC

## 2020-06-02 MED ORDER — CEPHALEXIN 500 MG PO CAPS: 500.0000 mg | ORAL_CAPSULE | Freq: Four times a day (QID) | ORAL | 0 refills | Status: DC

## 2020-06-02 NOTE — Progress Notes (Signed)
BP 125/77   Pulse 82   Ht 5\' 10"  (1.778 m)   Wt 194 lb (88 kg)   SpO2 95%   BMI 27.84 kg/m    Subjective:   Patient ID: Robert [name], male    DOB: 02-24-1955, 65 y.o.   MRN: [id number]  HPI: Robert [name] is a 65 y.o. male presenting on 06/02/2020 for Cyst (Testicle. Place is sore. Some bleeding if squeezed.)   HPI Patient has left testicular pain and swelling and feels like he has a cyst on there that has drained a small amount of purulent drainage.  He said he started to notice it about a week ago and it started out really small and is increased in size since then.  He denies any fevers or chills but definitely has some swelling and pain there.  Patient is having some sinus congestion from his allergies that have not been improving.  They have continued to worsen.  Relevant past medical, surgical, family and social history reviewed and updated as indicated. Interim medical history since our last visit reviewed. Allergies and medications reviewed and updated.  Review of Systems  Constitutional: Negative for chills and fever.  Respiratory: Negative for shortness of breath and wheezing.   Cardiovascular: Negative for chest pain and leg swelling.  Genitourinary: Positive for scrotal swelling.  Musculoskeletal: Negative for back pain and gait problem.  Skin: Positive for color change and wound. Negative for rash.  All other systems reviewed and are negative.   Per HPI unless specifically indicated above   Allergies as of 06/02/2020   No Known Allergies     Medication List       Accurate as of Jun 02, 2020  4:15 PM. If you have any questions, ask your nurse or doctor.        acetaminophen 325 MG tablet Commonly known as: TYLENOL Take 650 mg by mouth every 6 (six) hours as needed.   atorvastatin 20 MG tablet Commonly known as: LIPITOR TAKE 1 TABLET BY MOUTH EVERYDAY AT BEDTIME   azithromycin 250 MG tablet Commonly known as: Zithromax 2 tablets 500 mg tablet day 1,  1 tablet 250 mg tablet day 2-5.   benzonatate 100 MG capsule Commonly known as: Tessalon Perles Take 1 capsule (100 mg total) by mouth 3 (three) times daily as needed for cough.   fluticasone 50 MCG/ACT nasal spray Commonly known as: FLONASE Place 2 sprays into both nostrils daily.   ibuprofen 200 MG tablet Commonly known as: ADVIL Take 200 mg by mouth every 6 (six) hours as needed.   levothyroxine 112 MCG tablet Commonly known as: SYNTHROID Take 1 tablet (112 mcg total) by mouth daily.   ONE-A-DAY MENS 50+ ADVANTAGE PO Take 1 tablet by mouth daily.   oxyCODONE-acetaminophen 5-325 MG tablet Commonly known as: Percocet Take 1 tablet by mouth every 4 (four) hours as needed for severe pain.        Objective:   BP 125/77   Pulse 82   Ht 5\' 10"  (1.778 m)   Wt 194 lb (88 kg)   SpO2 95%   BMI 27.84 kg/m   Wt Readings from Last 3 Encounters:  06/02/20 194 lb (88 kg)  04/27/20 199 lb (90.3 kg)  04/19/20 203 lb 9.6 oz (92.4 kg)    Physical Exam Vitals and nursing note reviewed.  Constitutional:      Appearance: Normal appearance.  Skin:    Findings: Wound (Small draining abscess on the left side of his scrotum  with induration about 2-1/2 cm in diameter, no fluctuation.  Tender to palpation) present.  Neurological:     Mental Status: He is alert.     I&D: Region was anesthetized with topical ethyl chloride. Incision was made on anterior medial aspect of the abscess with a 10 blade.  Small amount serosanguineous and purulent drainage was exuded.  Pressure dressing was placed over top. Bleeding was minimal and patient tolerated procedure well.    Assessment & Plan:   Problem List Items Addressed This Visit   None   Visit Diagnoses    Scrotal wall abscess    -  Primary   Relevant Medications   cephALEXin (KEFLEX) 500 MG capsule   Allergic sinusitis       Relevant Medications   predniSONE (DELTASONE) 20 MG tablet   cephALEXin (KEFLEX) 500 MG capsule      Will  do prednisone for sinuses and Keflex for the small abscess on the left side of the scrotal wall.  Draining a small amount of purulent drainage right now, I believe the antibiotic will work to clear it out. Follow up plan: Return if symptoms worsen or fail to improve.  Counseling provided for all of the vaccine components No orders of the defined types were placed in this encounter.   Arville Care, MD [facility] 06/02/2020, 4:15 PM

## 2020-06-03 ENCOUNTER — Ambulatory Visit: Payer: Medicare HMO | Admitting: Family Medicine

## 2020-06-14 ENCOUNTER — Ambulatory Visit: Payer: Medicare HMO

## 2020-06-14 ENCOUNTER — Ambulatory Visit (INDEPENDENT_AMBULATORY_CARE_PROVIDER_SITE_OTHER): Payer: Medicare HMO

## 2020-06-14 VITALS — Ht 70.0 in | Wt 194.0 lb

## 2020-06-14 DIAGNOSIS — Z0000 Encounter for general adult medical examination without abnormal findings: Secondary | ICD-10-CM

## 2020-06-14 NOTE — Patient Instructions (Signed)
[name] , Thank you for taking time to come for your Medicare Wellness Visit. I appreciate your ongoing commitment to your health goals. Please review the following plan we discussed and let me know if I can assist you in the future.   Screening recommendations/referrals: Colonoscopy: Done 03/15/2014 - Repeat in 10 years Recommended yearly ophthalmology/optometry visit for glaucoma screening and checkup Recommended yearly dental visit for hygiene and checkup  Vaccinations: Influenza vaccine: Done 10/21/2019 - Repeat annually Pneumococcal vaccine: Prevnar DDone 05/08/2019; due for Pneumovax now Tdap vaccine: Done 12/10/2019 - Repeat in 10 years Shingles vaccine: DUE Shingrix discussed. Please contact your pharmacy for coverage information.    Covid-19: Declined  Advanced directives: Advance directive discussed with you today. I have provided a copy for you to complete at home and have notarized. Once this is complete please bring a copy in to our office so we can scan it into your chart.  Conditions/risks identified: Aim for 30 minutes of exercise or brisk walking each day, drink 6-8 glasses of water and eat lots of fruits and vegetables.  Next appointment: Follow up in one year for your annual wellness visit.   Preventive Care 31 Years and Older, Male  Preventive care refers to lifestyle choices and visits with your health care provider that can promote health and wellness. What does preventive care include?  A yearly physical exam. This is also called an annual well check.  Dental exams once or twice a year.  Routine eye exams. Ask your health care provider how often you should have your eyes checked.  Personal lifestyle choices, including:  Daily care of your teeth and gums.  Regular physical activity.  Eating a healthy diet.  Avoiding tobacco and drug use.  Limiting alcohol use.  Practicing safe sex.  Taking low doses of aspirin every day.  Taking vitamin and  mineral supplements as recommended by your health care provider. What happens during an annual well check? The services and screenings done by your health care provider during your annual well check will depend on your age, overall health, lifestyle risk factors, and family history of disease. Counseling  Your health care provider may ask you questions about your:  Alcohol use.  Tobacco use.  Drug use.  Emotional well-being.  Home and relationship well-being.  Sexual activity.  Eating habits.  History of falls.  Memory and ability to understand (cognition).  Work and work Astronomer. Screening  You may have the following tests or measurements:  Height, weight, and BMI.  Blood pressure.  Lipid and cholesterol levels. These may be checked every 5 years, or more frequently if you are over 33 years old.  Skin check.  Lung cancer screening. You may have this screening every year starting at age 64 if you have a 30-pack-year history of smoking and currently smoke or have quit within the past 15 years.  Fecal occult blood test (FOBT) of the stool. You may have this test every year starting at age 51.  Flexible sigmoidoscopy or colonoscopy. You may have a sigmoidoscopy every 5 years or a colonoscopy every 10 years starting at age 74.  Prostate cancer screening. Recommendations will vary depending on your family history and other risks.  Hepatitis C blood test.  Hepatitis B blood test.  Sexually transmitted disease (STD) testing.  Diabetes screening. This is done by checking your blood sugar (glucose) after you have not eaten for a while (fasting). You may have this done every 1-3 years.  Abdominal aortic aneurysm (  AAA) screening. You may need this if you are a current or former smoker.  Osteoporosis. You may be screened starting at age 55 if you are at high risk. Talk with your health care provider about your test results, treatment options, and if necessary, the need  for more tests. Vaccines  Your health care provider may recommend certain vaccines, such as:  Influenza vaccine. This is recommended every year.  Tetanus, diphtheria, and acellular pertussis (Tdap, Td) vaccine. You may need a Td booster every 10 years.  Zoster vaccine. You may need this after age 92.  Pneumococcal 13-valent conjugate (PCV13) vaccine. One dose is recommended after age 31.  Pneumococcal polysaccharide (PPSV23) vaccine. One dose is recommended after age 68. Talk to your health care provider about which screenings and vaccines you need and how often you need them. This information is not intended to replace advice given to you by your health care provider. Make sure you discuss any questions you have with your health care provider. Document Released: 02/04/2015 Document Revised: 09/28/2015 Document Reviewed: 11/09/2014 Elsevier Interactive Patient Education  2017 [place] Prevention in the Home Falls can cause injuries. They can happen to people of all ages. There are many things you can do to make your home safe and to help prevent falls. What can I do on the outside of my home?  Regularly fix the edges of walkways and driveways and fix any cracks.  Remove anything that might make you trip as you walk through a door, such as a raised step or threshold.  Trim any bushes or trees on the path to your home.  Use bright outdoor lighting.  Clear any walking paths of anything that might make someone trip, such as rocks or tools.  Regularly check to see if handrails are loose or broken. Make sure that both sides of any steps have handrails.  Any raised decks and porches should have guardrails on the edges.  Have any leaves, snow, or ice cleared regularly.  Use sand or salt on walking paths during winter.  Clean up any spills in your garage right away. This includes oil or grease spills. What can I do in the bathroom?  Use night lights.  Install grab bars  by the toilet and in the tub and shower. Do not use towel bars as grab bars.  Use non-skid mats or decals in the tub or shower.  If you need to sit down in the shower, use a plastic, non-slip stool.  Keep the floor dry. Clean up any water that spills on the floor as soon as it happens.  Remove soap buildup in the tub or shower regularly.  Attach bath mats securely with double-sided non-slip rug tape.  Do not have throw rugs and other things on the floor that can make you trip. What can I do in the bedroom?  Use night lights.  Make sure that you have a light by your bed that is easy to reach.  Do not use any sheets or blankets that are too big for your bed. They should not hang down onto the floor.  Have a firm chair that has side arms. You can use this for support while you get dressed.  Do not have throw rugs and other things on the floor that can make you trip. What can I do in the kitchen?  Clean up any spills right away.  Avoid walking on wet floors.  Keep items that you use a lot in  easy-to-reach places.  If you need to reach something above you, use a strong step stool that has a grab bar.  Keep electrical cords out of the way.  Do not use floor polish or wax that makes floors slippery. If you must use wax, use non-skid floor wax.  Do not have throw rugs and other things on the floor that can make you trip. What can I do with my stairs?  Do not leave any items on the stairs.  Make sure that there are handrails on both sides of the stairs and use them. Fix handrails that are broken or loose. Make sure that handrails are as long as the stairways.  Check any carpeting to make sure that it is firmly attached to the stairs. Fix any carpet that is loose or worn.  Avoid having throw rugs at the top or bottom of the stairs. If you do have throw rugs, attach them to the floor with carpet tape.  Make sure that you have a light switch at the top of the stairs and the  bottom of the stairs. If you do not have them, ask someone to add them for you. What else can I do to help prevent falls?  Wear shoes that:  Do not have high heels.  Have rubber bottoms.  Are comfortable and fit you well.  Are closed at the toe. Do not wear sandals.  If you use a stepladder:  Make sure that it is fully opened. Do not climb a closed stepladder.  Make sure that both sides of the stepladder are locked into place.  Ask someone to hold it for you, if possible.  Clearly mark and make sure that you can see:  Any grab bars or handrails.  First and last steps.  Where the edge of each step is.  Use tools that help you move around (mobility aids) if they are needed. These include:  Canes.  Walkers.  Scooters.  Crutches.  Turn on the lights when you go into a dark area. Replace any light bulbs as soon as they burn out.  Set up your furniture so you have a clear path. Avoid moving your furniture around.  If any of your floors are uneven, fix them.  If there are any pets around you, be aware of where they are.  Review your medicines with your doctor. Some medicines can make you feel dizzy. This can increase your chance of falling. Ask your doctor what other things that you can do to help prevent falls. This information is not intended to replace advice given to you by your health care provider. Make sure you discuss any questions you have with your health care provider. Document Released: 11/04/2008 Document Revised: 06/16/2015 Document Reviewed: 02/12/2014 Elsevier Interactive Patient Education  2017 [employer].

## 2020-06-14 NOTE — Progress Notes (Signed)
Subjective:   Robert [name] is a 65 y.o. male who presents for an Initial Medicare Annual Wellness Visit.  Virtual Visit via Telephone Note  I connected with  Robert [name] on 06/14/20 at  3:30 PM EDT by telephone and verified that I am speaking with the correct person using two identifiers.  Location: Patient: Home Provider: [facility] Persons participating in the virtual visit: patient/Nurse Health Advisor   I discussed the limitations, risks, security and privacy concerns of performing an evaluation and management service by telephone and the availability of in person appointments. The patient expressed understanding and agreed to proceed.  Interactive audio and video telecommunications were attempted between this nurse and patient, however failed, due to patient having technical difficulties OR patient did not have access to video capability.  We continued and completed visit with audio only.  Some vital signs may be absent or patient reported.   Aspin Palomarez E Wilman Tucker, LPN   Review of Systems     Cardiac Risk Factors include: advanced age (>48men, >67 women);dyslipidemia     Objective:    Today's Vitals   06/14/20 1337  Weight: 194 lb (88 kg)  Height: 5\' 10"  (1.778 m)  PainSc: 7    Body mass index is 27.84 kg/m.  Advanced Directives 06/14/2020 02/01/2020 03/01/2014  Does Patient Have a Medical Advance Directive? No No No  Would patient like information on creating a medical advance directive? Yes (MAU/Ambulatory/Procedural Areas - Information given) - -  Some encounter information is confidential and restricted. Go to Review Flowsheets activity to see all data.    Current Medications (verified) Outpatient Encounter Medications as of 06/14/2020  Medication Sig  . acetaminophen (TYLENOL) 325 MG tablet Take 650 mg by mouth every 6 (six) hours as needed.  06/16/2020 atorvastatin (LIPITOR) 20 MG tablet TAKE 1 TABLET BY MOUTH EVERYDAY AT BEDTIME  . fluticasone (FLONASE) 50 MCG/ACT nasal spray  Place 2 sprays into both nostrils daily.  Marland Kitchen ibuprofen (ADVIL) 200 MG tablet Take 200 mg by mouth every 6 (six) hours as needed.  Marland Kitchen levothyroxine (SYNTHROID) 112 MCG tablet Take 1 tablet (112 mcg total) by mouth daily.  . Multiple Vitamins-Minerals (ONE-A-DAY MENS 50+ ADVANTAGE PO) Take 1 tablet by mouth daily.  . [DISCONTINUED] azithromycin (ZITHROMAX) 250 MG tablet 2 tablets 500 mg tablet day 1, 1 tablet 250 mg tablet day 2-5.  . [DISCONTINUED] benzonatate (TESSALON PERLES) 100 MG capsule Take 1 capsule (100 mg total) by mouth 3 (three) times daily as needed for cough.  . [DISCONTINUED] cephALEXin (KEFLEX) 500 MG capsule Take 1 capsule (500 mg total) by mouth 4 (four) times daily.  . [DISCONTINUED] diphenhydrAMINE (BENADRYL) 25 MG tablet Take 25-50 mg by mouth daily as needed. For allergic reaction  . [DISCONTINUED] fexofenadine (ALLEGRA) 180 MG tablet 1 po qAM for hives and itching  . [DISCONTINUED] oxyCODONE-acetaminophen (PERCOCET) 5-325 MG tablet Take 1 tablet by mouth every 4 (four) hours as needed for severe pain.  . [DISCONTINUED] predniSONE (DELTASONE) 20 MG tablet 2 po at same time daily for 5 days   No facility-administered encounter medications on file as of 06/14/2020.    Allergies (verified) Patient has no known allergies.   History: Past Medical History:  Diagnosis Date  . Thyroid disease    Past Surgical History:  Procedure Laterality Date  . COLONOSCOPY  1980's   in [facility]  . HERNIA REPAIR  03/2014   Umbilical  . NO PAST SURGERIES    . ORIF PATELLA Left 12/11/2019   Procedure:  OPEN REDUCTION INTERNAL (ORIF) FIXATION PATELLA;  Surgeon: Roby Lofts, MD;  Location: [facility] OR;  Service: Orthopedics;  Laterality: Left;   Family History  Problem Relation Age of Onset  . Heart Problems Mother   . Suicidality Father   . Thyroid disease Neg Hx   . Colon cancer Neg Hx    Social History   Socioeconomic History  . Marital status: Single    Spouse name: Not on  file  . Number of children: 0  . Years of education: Not on file  . Highest education level: Not on file  Occupational History  . Occupation: administer  Tobacco Use  . Smoking status: Former Smoker    Packs/day: 0.25    Types: Cigarettes    Start date: 12/08/2000    Quit date: 12/09/2010    Years since quitting: 9.5  . Smokeless tobacco: Never Used  Vaping Use  . Vaping Use: Never used  Substance and Sexual Activity  . Alcohol use: Never  . Drug use: Never  . Sexual activity: Not Currently  Other Topics Concern  . Not on file  Social History Narrative   ** Merged History Encounter **   Lives alone; 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   Brother passed away 05/04/20 in a house fire - has nieces and nephews nearby   Social Determinants of Health   Financial Resource Strain: Low Risk   . Difficulty of Paying Living Expenses: Not hard at all  Food Insecurity: No Food Insecurity  . Worried About Programme researcher, broadcasting/film/video in the Last Year: Never true  . Ran Out of Food in the Last Year: Never true  Transportation Needs: No Transportation Needs  . Lack of Transportation (Medical): No  . Lack of Transportation (Non-Medical): No  Physical Activity: Sufficiently Active  . Days of Exercise per Week: 4 days  . Minutes of Exercise per Session: 60 min  Stress: No Stress Concern Present  . Feeling of Stress : Only a little  Social Connections: Moderately Integrated  . Frequency of Communication with Friends and Family: More than three times a week  . Frequency of Social Gatherings with Friends and Family: Twice a week  . Attends Religious Services: More than 4 times per year  . Active Member of Clubs or Organizations: Yes  . Attends Banker Meetings: More than 4 times per year  . Marital Status: Never married    Tobacco Counseling Counseling given: Not Answered   Clinical Intake:  Pre-visit preparation completed: Yes  Pain : 0-10 Pain Score: 7  Pain Type:  Chronic pain Pain Location: Knee Pain Orientation: Left Pain Descriptors / Indicators: Aching,Sore Pain Onset: More than a month ago Pain Frequency: Intermittent     BMI - recorded: 27.84 Nutritional Status: BMI 25 -29 Overweight Nutritional Risks: None Diabetes: No  How often do you need to have someone help you when you read instructions, pamphlets, or other written materials from your doctor or pharmacy?: 1 - Never  Diabetic? No  Interpreter Needed?: No  Information entered by :: Kijuana Ruppel, LPN   Activities of Daily Living In your present state of health, do you have any difficulty performing the following activities: 06/14/2020  Hearing? N  Vision? N  Difficulty concentrating or making decisions? N  Walking or climbing stairs? Y  Dressing or bathing? N  Doing errands, shopping? N  Preparing Food and eating ? N  Using the Toilet? N  In the past six months,  have you accidently leaked urine? N  Do you have problems with loss of bowel control? N  Managing your Medications? N  Managing your Finances? N  Housekeeping or managing your Housekeeping? N  Some encounter information is confidential and restricted. Go to Review Flowsheets activity to see all data.  Some recent data might be hidden    Patient Care Team: Dettinger, Elige RadonJoshua A, MD as PCP - General (Family Medicine) Dettinger, Elige RadonJoshua A, MD (Family Medicine)  Indicate any recent Medical Services you may have received from other than [facility] providers in the past year (date may be approximate).     Assessment:   This is a routine wellness examination for Robert NajjarLarry.  Hearing/Vision screen  Hearing Screening   125Hz  250Hz  500Hz  1000Hz  2000Hz  3000Hz  4000Hz  6000Hz  8000Hz   Right ear:           Left ear:           Comments: Denies hearing difficulties  Vision Screening Comments: Wears reading glasses only - [place] [facility] [place] - up to date with eye exam  Dietary issues and exercise activities  discussed: Current Exercise Habits: Home exercise routine, Type of exercise: strength training/weights;walking, Time (Minutes): 60, Frequency (Times/Week): 4, Weekly Exercise (Minutes/Week): 240, Intensity: Moderate  Goals Addressed            This Visit's Progress   . Patient Stated       He'd like to make time to study more and keep his mind active      Depression Screen PHQ 2/9 Scores 06/14/2020 06/02/2020 04/19/2020 02/11/2020 10/21/2019 06/05/2019 05/08/2019  PHQ - 2 Score 0 0 0 0 0 0 0    Fall Risk Fall Risk  06/14/2020 06/02/2020 04/27/2020 04/19/2020 02/11/2020  Falls in the past year? 1 0 0 1 1  Number falls in past yr: 0 - - 0 0  Injury with Fall? 1 - - 1 1  Risk for fall due to : History of fall(s);Orthopedic patient - - Orthopedic patient History of fall(s);Impaired mobility  Follow up Education provided;Falls prevention discussed - - Education provided Falls evaluation completed    FALL RISK PREVENTION PERTAINING TO THE HOME:  Any stairs in or around the home? No  If so, are there any without handrails? No  Home free of loose throw rugs in walkways, pet beds, electrical cords, etc? Yes  Adequate lighting in your home to reduce risk of falls? Yes   ASSISTIVE DEVICES UTILIZED TO PREVENT FALLS:  Life alert? No  Use of a cane, walker or w/c? No  Grab bars in the bathroom? No  Shower chair or bench in shower? No  Elevated toilet seat or a handicapped toilet? No   TIMED UP AND GO:  Was the test performed? No . Telephonic visit  Cognitive Function:     6CIT Screen 06/14/2020  What Year? 0 points  What month? 0 points  What time? 0 points  Count back from 20 0 points  Months in reverse 2 points  Repeat phrase 0 points  Total Score 2    Immunizations Immunization History  Administered Date(s) Administered  . Fluad Quad(high Dose 65+) 10/21/2019  . Influenza,inj,Quad PF,6+ Mos 11/21/2017, 10/01/2018  . Influenza,trivalent, recombinat, inj, PF 10/26/2016  .  Influenza-Unspecified 10/31/2015  . Pneumococcal Conjugate-13 05/08/2019  . Tdap 12/10/2019    TDAP status: Up to date  Flu Vaccine status: Up to date  Pneumococcal vaccine status: Due, Education has been provided regarding the importance of this  vaccine. Advised may receive this vaccine at local pharmacy or Health Dept. Aware to provide a copy of the vaccination record if obtained from local pharmacy or Health Dept. Verbalized acceptance and understanding.  Covid-19 vaccine status: Declined, Education has been provided regarding the importance of this vaccine but patient still declined. Advised may receive this vaccine at local pharmacy or Health Dept.or vaccine clinic. Aware to provide a copy of the vaccination record if obtained from local pharmacy or Health Dept. Verbalized acceptance and understanding.  Qualifies for Shingles Vaccine? Yes   Zostavax completed No   Shingrix Completed?: No.    Education has been provided regarding the importance of this vaccine. Patient has been advised to call insurance company to determine out of pocket expense if they have not yet received this vaccine. Advised may also receive vaccine at local pharmacy or Health Dept. Verbalized acceptance and understanding.  Screening Tests Health Maintenance  Topic Date Due  . PNA vac Low Risk Adult (2 of 2 - PPSV23) 05/07/2020  . COVID-19 Vaccine (1) 06/18/2020 (Originally 01/04/1961)  . INFLUENZA VACCINE  08/22/2020  . COLONOSCOPY (Pts 45-35yrs Insurance coverage will need to be confirmed)  03/15/2024  . TETANUS/TDAP  12/09/2029  . Hepatitis C Screening  Completed  . HPV VACCINES  Aged Out    Health Maintenance  Health Maintenance Due  Topic Date Due  . PNA vac Low Risk Adult (2 of 2 - PPSV23) 05/07/2020    Colorectal cancer screening: Type of screening: Colonoscopy. Completed 03/15/2014. Repeat every 10 years  Lung Cancer Screening: (Low Dose CT Chest recommended if Age 44-80 years, 30 pack-year  currently smoking OR have quit w/in 15years.) does not qualify.   Additional Screening:  Hepatitis C Screening: does qualify; Completed 09/06/2016  Vision Screening: Recommended annual ophthalmology exams for early detection of glaucoma and other disorders of the eye. Is the patient up to date with their annual eye exam?  Yes  Who is the provider or what is the name of the office in which the patient attends annual eye exams? [facility] [place] If pt is not established with a provider, would they like to be referred to a provider to establish care? No .   Dental Screening: Recommended annual dental exams for proper oral hygiene  Community Resource Referral / Chronic Care Management: CRR required this visit?  No   CCM required this visit?  No      Plan:     I have personally reviewed and noted the following in the patient's chart:   . Medical and social history . Use of alcohol, tobacco or illicit drugs  . Current medications and supplements including opioid prescriptions. Patient is not currently taking opioid prescriptions. . Functional ability and status . Nutritional status . Physical activity . Advanced directives . List of other physicians . Hospitalizations, surgeries, and ER visits in previous 12 months . Vitals . Screenings to include cognitive, depression, and falls . Referrals and appointments  In addition, I have reviewed and discussed with patient certain preventive protocols, quality metrics, and best practice recommendations. A written personalized care plan for preventive services as well as general preventive health recommendations were provided to patient.     [place] Constable, LPN   [id number]/[id number]   Nurse Notes: None

## 2020-06-21 DIAGNOSIS — M25561 Pain in right knee: Secondary | ICD-10-CM | POA: Diagnosis not present

## 2020-06-28 DIAGNOSIS — M25561 Pain in right knee: Secondary | ICD-10-CM | POA: Diagnosis not present

## 2020-08-09 ENCOUNTER — Other Ambulatory Visit: Payer: Self-pay

## 2020-08-09 NOTE — Progress Notes (Signed)
[name] denies chest pain or shortness of breath. Patient denies having any s/s of Covid in her household.  Patient denies any known exposure to Covid.  I instructed patient to shower with antibiotic soap, if it is available.  Dry off with a clean towel. Do not put lotion, powder, cologne or deodorant or makeup.No jewelry or piercings. Man may shave their face and neck.  No nail polish or artificial nails. Wear clean clothes, brush your teeth. Glasses, contact lens,dentures or partials may not be worn in the OR. If you need to wear them, please bring a case for glasses, do not wear contacts or bring a case, the hospital does not have contact cases, dentures or partials will have to be removed , make sure they are clean, we will provide a denture cup to put them in. You will need some one to drive you home and a responsible person over the age of 67 to stay with you for the first 24 hours after surgery.  [name] states he does not have anyone to stay with him. I said that it is a requirement from the anesthesiologist. I said I will notify Dr Everardo Pacific and see if patient can stay over night, Mr, Robert [name] said, "Oh no I do not want to spend the night, I'll get someone to stay with me."

## 2020-08-10 ENCOUNTER — Encounter (HOSPITAL_COMMUNITY): Payer: Self-pay | Admitting: Student

## 2020-08-10 ENCOUNTER — Ambulatory Visit (HOSPITAL_COMMUNITY)
Admission: RE | Admit: 2020-08-10 | Discharge: 2020-08-10 | Disposition: A | Discharge status: Home / Self Care | Payer: Medicare HMO | Attending: Student | Admitting: Student

## 2020-08-10 ENCOUNTER — Encounter (HOSPITAL_COMMUNITY)
Admission: RE | Disposition: A | Discharge status: Home / Self Care | Payer: Self-pay | Source: Home / Self Care | Attending: Student

## 2020-08-10 ENCOUNTER — Ambulatory Visit (HOSPITAL_COMMUNITY): Payer: Medicare HMO | Admitting: Certified Registered"

## 2020-08-10 ENCOUNTER — Ambulatory Visit (HOSPITAL_COMMUNITY): Payer: Medicare HMO

## 2020-08-10 DIAGNOSIS — M1712 Unilateral primary osteoarthritis, left knee: Secondary | ICD-10-CM | POA: Insufficient documentation

## 2020-08-10 DIAGNOSIS — S82002A Unspecified fracture of left patella, initial encounter for closed fracture: Secondary | ICD-10-CM | POA: Diagnosis not present

## 2020-08-10 DIAGNOSIS — X58XXXA Exposure to other specified factors, initial encounter: Secondary | ICD-10-CM | POA: Diagnosis not present

## 2020-08-10 DIAGNOSIS — G8918 Other acute postprocedural pain: Secondary | ICD-10-CM | POA: Diagnosis not present

## 2020-08-10 DIAGNOSIS — Y831 Surgical operation with implant of artificial internal device as the cause of abnormal reaction of the patient, or of later complication, without mention of misadventure at the time of the procedure: Secondary | ICD-10-CM | POA: Diagnosis not present

## 2020-08-10 DIAGNOSIS — S83242A Other tear of medial meniscus, current injury, left knee, initial encounter: Secondary | ICD-10-CM | POA: Diagnosis not present

## 2020-08-10 DIAGNOSIS — Z87891 Personal history of nicotine dependence: Secondary | ICD-10-CM | POA: Insufficient documentation

## 2020-08-10 DIAGNOSIS — Z419 Encounter for procedure for purposes other than remedying health state, unspecified: Secondary | ICD-10-CM

## 2020-08-10 DIAGNOSIS — T8484XA Pain due to internal orthopedic prosthetic devices, implants and grafts, initial encounter: Secondary | ICD-10-CM | POA: Insufficient documentation

## 2020-08-10 DIAGNOSIS — J0110 Acute frontal sinusitis, unspecified: Secondary | ICD-10-CM | POA: Diagnosis not present

## 2020-08-10 DIAGNOSIS — I700 Atherosclerosis of aorta: Secondary | ICD-10-CM | POA: Diagnosis not present

## 2020-08-10 DIAGNOSIS — M24662 Ankylosis, left knee: Secondary | ICD-10-CM | POA: Diagnosis not present

## 2020-08-10 DIAGNOSIS — Z472 Encounter for removal of internal fixation device: Secondary | ICD-10-CM | POA: Diagnosis not present

## 2020-08-10 HISTORY — PX: KNEE ARTHROSCOPY: SHX127

## 2020-08-10 HISTORY — PX: HARDWARE REMOVAL: SHX979

## 2020-08-10 LAB — CBC
HCT: 36.7 % — ABNORMAL LOW (ref 39.0–52.0)
Hemoglobin: 12.3 g/dL — ABNORMAL LOW (ref 13.0–17.0)
MCH: 31.8 pg (ref 26.0–34.0)
MCHC: 33.5 g/dL (ref 30.0–36.0)
MCV: 94.8 fL (ref 80.0–100.0)
Platelets: 186 10*3/uL (ref 150–400)
RBC: 3.87 MIL/uL — ABNORMAL LOW (ref 4.22–5.81)
RDW: 13.2 % (ref 11.5–15.5)
WBC: 4 10*3/uL (ref 4.0–10.5)
nRBC: 0 % (ref 0.0–0.2)

## 2020-08-10 SURGERY — REMOVAL, HARDWARE
Anesthesia: General | Site: Knee | Laterality: Left

## 2020-08-10 MED ORDER — FENTANYL CITRATE (PF) 100 MCG/2ML IJ SOLN: 25.0000 ug | INTRAMUSCULAR | Status: DC | PRN

## 2020-08-10 MED ORDER — SODIUM CHLORIDE 0.9 % IR SOLN
Status: DC | PRN
  Administered 2020-08-10: 3000 mL

## 2020-08-10 MED ORDER — ROCURONIUM BROMIDE 10 MG/ML (PF) SYRINGE
PREFILLED_SYRINGE | INTRAVENOUS | Status: AC
  Filled 2020-08-10: qty 10

## 2020-08-10 MED ORDER — MIDAZOLAM HCL 2 MG/2ML IJ SOLN
INTRAMUSCULAR | Status: AC
  Filled 2020-08-10: qty 2

## 2020-08-10 MED ORDER — FENTANYL CITRATE (PF) 250 MCG/5ML IJ SOLN
INTRAMUSCULAR | Status: DC | PRN
  Administered 2020-08-10 (×3): 50 ug via INTRAVENOUS

## 2020-08-10 MED ORDER — FENTANYL CITRATE (PF) 250 MCG/5ML IJ SOLN
INTRAMUSCULAR | Status: AC
  Filled 2020-08-10: qty 5

## 2020-08-10 MED ORDER — PROPOFOL 10 MG/ML IV BOLUS
INTRAVENOUS | Status: AC
  Filled 2020-08-10: qty 20

## 2020-08-10 MED ORDER — PROPOFOL 10 MG/ML IV BOLUS
INTRAVENOUS | Status: DC | PRN
Start: 2020-08-10 — End: 2020-08-10
  Administered 2020-08-10: 200 mg via INTRAVENOUS

## 2020-08-10 MED ORDER — ONDANSETRON HCL 4 MG/2ML IJ SOLN
INTRAMUSCULAR | Status: AC
  Filled 2020-08-10: qty 2

## 2020-08-10 MED ORDER — PHENYLEPHRINE 40 MCG/ML (10ML) SYRINGE FOR IV PUSH (FOR BLOOD PRESSURE SUPPORT)
PREFILLED_SYRINGE | INTRAVENOUS | Status: DC | PRN
Start: 2020-08-10 — End: 2020-08-10
  Administered 2020-08-10: 160 ug via INTRAVENOUS

## 2020-08-10 MED ORDER — LIDOCAINE 2% (20 MG/ML) 5 ML SYRINGE
INTRAMUSCULAR | Status: AC
  Filled 2020-08-10: qty 5

## 2020-08-10 MED ORDER — ROPIVACAINE HCL 5 MG/ML IJ SOLN
INTRAMUSCULAR | Status: DC | PRN
  Administered 2020-08-10: 20 mg via PERINEURAL

## 2020-08-10 MED ORDER — DEXAMETHASONE SODIUM PHOSPHATE 10 MG/ML IJ SOLN
INTRAMUSCULAR | Status: DC | PRN
  Administered 2020-08-10: 5 mg

## 2020-08-10 MED ORDER — CHLORHEXIDINE GLUCONATE 0.12 % MT SOLN
15.0000 mL | OROMUCOSAL | Status: AC
  Filled 2020-08-10: qty 15

## 2020-08-10 MED ORDER — HYDROCODONE-ACETAMINOPHEN 7.5-325 MG PO TABS: 1.0000 | ORAL_TABLET | ORAL | 0 refills | Status: DC | PRN

## 2020-08-10 MED ORDER — MIDAZOLAM HCL 2 MG/2ML IJ SOLN
INTRAMUSCULAR | Status: DC | PRN
  Administered 2020-08-10: 2 mg via INTRAVENOUS

## 2020-08-10 MED ORDER — DEXAMETHASONE SODIUM PHOSPHATE 10 MG/ML IJ SOLN
INTRAMUSCULAR | Status: DC | PRN
  Administered 2020-08-10: 5 mg via INTRAVENOUS

## 2020-08-10 MED ORDER — PHENYLEPHRINE HCL-NACL 10-0.9 MG/250ML-% IV SOLN
INTRAVENOUS | Status: AC
  Filled 2020-08-10: qty 250

## 2020-08-10 MED ORDER — ACETAMINOPHEN 500 MG PO TABS
1000.0000 mg | ORAL_TABLET | Freq: Once | ORAL | Status: AC
  Administered 2020-08-10: 1000 mg via ORAL
  Filled 2020-08-10: qty 2

## 2020-08-10 MED ORDER — ONDANSETRON HCL 4 MG/2ML IJ SOLN
INTRAMUSCULAR | Status: DC | PRN
  Administered 2020-08-10: 4 mg via INTRAVENOUS

## 2020-08-10 MED ORDER — LIDOCAINE HCL (PF) 2 % IJ SOLN
INTRAMUSCULAR | Status: DC | PRN
  Administered 2020-08-10: 20 mg via INTRADERMAL

## 2020-08-10 MED ORDER — PHENYLEPHRINE 40 MCG/ML (10ML) SYRINGE FOR IV PUSH (FOR BLOOD PRESSURE SUPPORT)
PREFILLED_SYRINGE | INTRAVENOUS | Status: AC
  Filled 2020-08-10: qty 10

## 2020-08-10 MED ORDER — 0.9 % SODIUM CHLORIDE (POUR BTL) OPTIME
TOPICAL | Status: DC | PRN
  Administered 2020-08-10: 1000 mL

## 2020-08-10 MED ORDER — DEXAMETHASONE SODIUM PHOSPHATE 10 MG/ML IJ SOLN
INTRAMUSCULAR | Status: AC
  Filled 2020-08-10: qty 1

## 2020-08-10 MED ORDER — CHLORHEXIDINE GLUCONATE 0.12 % MT SOLN
OROMUCOSAL | Status: AC
  Administered 2020-08-10: 15 mL via OROMUCOSAL
  Filled 2020-08-10: qty 15

## 2020-08-10 MED ORDER — LACTATED RINGERS IV SOLN: INTRAVENOUS | Status: DC

## 2020-08-10 MED ORDER — CEFAZOLIN SODIUM-DEXTROSE 2-4 GM/100ML-% IV SOLN
2.0000 g | INTRAVENOUS | Status: AC
  Administered 2020-08-10: 2 g via INTRAVENOUS
  Filled 2020-08-10: qty 100

## 2020-08-10 SURGICAL SUPPLY — 67 items
BAG COUNTER SPONGE SURGICOUNT (BAG) ×3 IMPLANT
BANDAGE ESMARK 6X9 LF (GAUZE/BANDAGES/DRESSINGS) IMPLANT
BLADE EXCALIBUR 4.0X13 (MISCELLANEOUS) ×3 IMPLANT
BNDG COHESIVE 6X5 TAN STRL LF (GAUZE/BANDAGES/DRESSINGS) ×3 IMPLANT
BNDG ELASTIC 4X5.8 VLCR STR LF (GAUZE/BANDAGES/DRESSINGS) ×3 IMPLANT
BNDG ELASTIC 6X5.8 VLCR STR LF (GAUZE/BANDAGES/DRESSINGS) ×3 IMPLANT
BNDG ESMARK 6X9 LF (GAUZE/BANDAGES/DRESSINGS)
BNDG GAUZE ELAST 4 BULKY (GAUZE/BANDAGES/DRESSINGS) ×3 IMPLANT
BRUSH SCRUB EZ PLAIN DRY (MISCELLANEOUS) IMPLANT
CHLORAPREP W/TINT 26 (MISCELLANEOUS) ×3 IMPLANT
COVER SURGICAL LIGHT HANDLE (MISCELLANEOUS) ×3 IMPLANT
CUFF TOURN SGL QUICK 18X4 (TOURNIQUET CUFF) IMPLANT
CUFF TOURN SGL QUICK 24 (TOURNIQUET CUFF)
CUFF TOURN SGL QUICK 34 (TOURNIQUET CUFF) ×1
CUFF TRNQT CYL 24X4X16.5-23 (TOURNIQUET CUFF) IMPLANT
CUFF TRNQT CYL 34X4.125X (TOURNIQUET CUFF) ×2 IMPLANT
DERMABOND ADVANCED (GAUZE/BANDAGES/DRESSINGS) ×1
DERMABOND ADVANCED .7 DNX12 (GAUZE/BANDAGES/DRESSINGS) ×2 IMPLANT
DISSECTOR 4.0MMX13CM CVD (MISCELLANEOUS) ×3 IMPLANT
DRAPE C-ARM 42X72 X-RAY (DRAPES) ×3 IMPLANT
DRAPE C-ARMOR (DRAPES) IMPLANT
DRAPE U-SHAPE 47X51 STRL (DRAPES) ×3 IMPLANT
DRSG ADAPTIC 3X8 NADH LF (GAUZE/BANDAGES/DRESSINGS) ×3 IMPLANT
DRSG MEPILEX BORDER 4X8 (GAUZE/BANDAGES/DRESSINGS) ×3 IMPLANT
ELECT REM PT RETURN 9FT ADLT (ELECTROSURGICAL) ×3
ELECTRODE REM PT RTRN 9FT ADLT (ELECTROSURGICAL) ×2 IMPLANT
GAUZE 4X4 16PLY ~~LOC~~+RFID DBL (SPONGE) ×3 IMPLANT
GAUZE SPONGE 4X4 12PLY STRL (GAUZE/BANDAGES/DRESSINGS) ×3 IMPLANT
GLOVE SURG ENC MOIS LTX SZ6.5 (GLOVE) ×9 IMPLANT
GLOVE SURG ENC MOIS LTX SZ7.5 (GLOVE) ×12 IMPLANT
GLOVE SURG UNDER POLY LF SZ6.5 (GLOVE) ×3 IMPLANT
GLOVE SURG UNDER POLY LF SZ7.5 (GLOVE) ×3 IMPLANT
GOWN STRL REUS W/ TWL LRG LVL3 (GOWN DISPOSABLE) ×4 IMPLANT
GOWN STRL REUS W/TWL LRG LVL3 (GOWN DISPOSABLE) ×2
KIT BASIN OR (CUSTOM PROCEDURE TRAY) ×3 IMPLANT
KIT TURNOVER KIT B (KITS) ×3 IMPLANT
MANIFOLD NEPTUNE II (INSTRUMENTS) ×3 IMPLANT
NEEDLE 22X1 1/2 (OR ONLY) (NEEDLE) ×3 IMPLANT
NS IRRIG 1000ML POUR BTL (IV SOLUTION) ×3 IMPLANT
PACK ORTHO EXTREMITY (CUSTOM PROCEDURE TRAY) ×3 IMPLANT
PAD ARMBOARD 7.5X6 YLW CONV (MISCELLANEOUS) ×6 IMPLANT
PAD CAST 4YDX4 CTTN HI CHSV (CAST SUPPLIES) ×2 IMPLANT
PADDING CAST COTTON 4X4 STRL (CAST SUPPLIES) ×1
PADDING CAST COTTON 6X4 STRL (CAST SUPPLIES) IMPLANT
PORT APPOLLO RF 90DEGREE MULTI (SURGICAL WAND) ×3 IMPLANT
SPONGE T-LAP 18X18 ~~LOC~~+RFID (SPONGE) ×3 IMPLANT
STAPLER VISISTAT 35W (STAPLE) IMPLANT
STOCKINETTE IMPERVIOUS LG (DRAPES) ×3 IMPLANT
STRIP CLOSURE SKIN 1/2X4 (GAUZE/BANDAGES/DRESSINGS) IMPLANT
SUCTION FRAZIER HANDLE 10FR (MISCELLANEOUS) ×1
SUCTION TUBE FRAZIER 10FR DISP (MISCELLANEOUS) ×2 IMPLANT
SUT ETHILON 3 0 PS 1 (SUTURE) ×3 IMPLANT
SUT MNCRL AB 3-0 PS2 18 (SUTURE) ×3 IMPLANT
SUT MNCRL AB 3-0 PS2 27 (SUTURE) ×3 IMPLANT
SUT MON AB 2-0 CT1 36 (SUTURE) ×3 IMPLANT
SUT PDS AB 2-0 CT1 27 (SUTURE) ×6 IMPLANT
SUT VIC AB 0 CT1 27 (SUTURE)
SUT VIC AB 0 CT1 27XBRD ANBCTR (SUTURE) IMPLANT
SUT VIC AB 2-0 CT1 27 (SUTURE)
SUT VIC AB 2-0 CT1 TAPERPNT 27 (SUTURE) IMPLANT
SYR CONTROL 10ML LL (SYRINGE) IMPLANT
TOWEL GREEN STERILE (TOWEL DISPOSABLE) ×3 IMPLANT
TOWEL GREEN STERILE FF (TOWEL DISPOSABLE) ×6 IMPLANT
TUBE CONNECTING 12X1/4 (SUCTIONS) ×3 IMPLANT
UNDERPAD 30X36 HEAVY ABSORB (UNDERPADS AND DIAPERS) ×3 IMPLANT
WATER STERILE IRR 1000ML POUR (IV SOLUTION) ×3 IMPLANT
YANKAUER SUCT BULB TIP NO VENT (SUCTIONS) ×3 IMPLANT

## 2020-08-10 NOTE — Discharge Instructions (Addendum)
Orthopaedic Trauma Service Discharge Instructions   General Discharge Instructions  WEIGHT BEARING STATUS:Weight bearing as tolerated  RANGE OF MOTION/ACTIVITY:No restrictions  Wound Care:Keep dressing on for 48 hours postop and then may remove and leave open to air and shower.  DVT/PE prophylaxis:None needed  Diet: as you were eating previously.  Can use over the counter stool softeners and bowel preparations, such as Miralax, to help with bowel movements.  Narcotics can be constipating.  Be sure to drink plenty of fluids  PAIN MEDICATION USE AND EXPECTATIONS  You have likely been given narcotic medications to help control your pain.  After a traumatic event that results in an fracture (broken bone) with or without surgery, it is ok to use narcotic pain medications to help control one's pain.  We understand that everyone responds to pain differently and each individual patient will be evaluated on a regular basis for the continued need for narcotic medications. Ideally, narcotic medication use should last no more than 6-8 weeks (coinciding with fracture healing).   As a patient it is your responsibility as well to monitor narcotic medication use and report the amount and frequency you use these medications when you come to your office visit.   We would also advise that if you are using narcotic medications, you should take a dose prior to therapy to maximize you participation.  IF YOU ARE ON NARCOTIC MEDICATIONS IT IS NOT PERMISSIBLE TO OPERATE A MOTOR VEHICLE (MOTORCYCLE/CAR/TRUCK/MOPED) OR HEAVY MACHINERY DO NOT MIX NARCOTICS WITH OTHER CNS (CENTRAL NERVOUS SYSTEM) DEPRESSANTS SUCH AS ALCOHOL   POST-OPERATIVE OPIOID TAPER INSTRUCTIONS: It is important to wean off of your opioid medication as soon as possible. If you do not need pain medication after your surgery it is ok to stop day one. Opioids include: Codeine, Hydrocodone(Norco, Vicodin), Oxycodone(Percocet, oxycontin) and  hydromorphone amongst others.  Long term and even short term use of opiods can cause: Increased pain response Dependence Constipation Depression Respiratory depression And more.  Withdrawal symptoms can include Flu like symptoms Nausea, vomiting And more Techniques to manage these symptoms Hydrate well Eat regular healthy meals Stay active Use relaxation techniques(deep breathing, meditating, yoga) Do Not substitute Alcohol to help with tapering If you have been on opioids for less than two weeks and do not have pain than it is ok to stop all together.  Plan to wean off of opioids This plan should start within one week post op of your fracture surgery  Maintain the same interval or time between taking each dose and first decrease the dose.  Cut the total daily intake of opioids by one tablet each day Next start to increase the time between doses. The last dose that should be eliminated is the evening dose.    STOP SMOKING OR USING NICOTINE PRODUCTS!!!!  As discussed nicotine severely impairs your body's ability to heal surgical and traumatic wounds but also impairs bone healing.  Wounds and bone heal by forming microscopic blood vessels (angiogenesis) and nicotine is a vasoconstrictor (essentially, shrinks blood vessels).  Therefore, if vasoconstriction occurs to these microscopic blood vessels they essentially disappear and are unable to deliver necessary nutrients to the healing tissue.  This is one modifiable factor that you can do to dramatically increase your chances of healing your injury.    (This means no smoking, no nicotine gum, patches, etc)  ICE AND ELEVATE INJURED/OPERATIVE EXTREMITY  Using ice and elevating the injured extremity above your heart can help with swelling and pain control.  Icing  in a pulsatile fashion, such as 20 minutes on and 20 minutes off, can be followed.    Do not place ice directly on skin. Make sure there is a barrier between to skin and the ice  pack.    Using frozen items such as frozen peas works well as the conform nicely to the are that needs to be iced.  USE AN ACE WRAP OR TED HOSE FOR SWELLING CONTROL  In addition to icing and elevation, Ace wraps or TED hose are used to help limit and resolve swelling.  It is recommended to use Ace wraps or TED hose until you are informed to stop.    When using Ace Wraps start the wrapping distally (farthest away from the body) and wrap proximally (closer to the body)   Example: If you had surgery on your leg or thing and you do not have a splint on, start the ace wrap at the toes and work your way up to the thigh        If you had surgery on your upper extremity and do not have a splint on, start the ace wrap at your fingers and work your way up to the upper arm   Call office for the following: Temperature greater than 101F Persistent nausea and vomiting Severe uncontrolled pain Redness, tenderness, or signs of infection (pain, swelling, redness, odor or green/yellow discharge around the site) Difficulty breathing, headache or visual disturbances Hives Persistent dizziness or light-headedness Extreme fatigue Any other questions or concerns you may have after discharge  In an emergency, call 911 or go to an Emergency Department at a nearby hospital  HELPFUL INFORMATION  If you had a block, it will wear off between 8-24 hrs postop typically.  This is period when your pain may go from nearly zero to the pain you would have had postop without the block.  This is an abrupt transition but nothing dangerous is happening.  You may take an extra dose of narcotic when this happens.  You should wean off your narcotic medicines as soon as you are able.  Most patients will be off or using minimal narcotics before their first postop appointment.   We suggest you use the pain medication the first night prior to going to bed, in order to ease any pain when the anesthesia wears off. You should avoid  taking pain medications on an empty stomach as it will make you nauseous.  Do not drink alcoholic beverages or take illicit drugs when taking pain medications.  In most states it is against the law to drive while you are in a splint or sling.  And certainly against the law to drive while taking narcotics.  You may return to work/school in the next couple of days when you feel up to it.   Pain medication may make you constipated.  Below are a few solutions to try in this order: Decrease the amount of pain medication if you aren't having pain. Drink lots of decaffeinated fluids. Drink prune juice and/or each dried prunes  If the first 3 don't work start with additional solutions Take Colace - an over-the-counter stool softener Take Senokot - an over-the-counter laxative Take Miralax - a stronger over-the-counter laxative     CALL THE OFFICE WITH ANY QUESTIONS OR CONCERNS: [phone]   VISIT OUR WEBSITE FOR ADDITIONAL INFORMATION: [email or web address]

## 2020-08-10 NOTE — Anesthesia Procedure Notes (Signed)
Anesthesia Regional Block: Adductor canal block   Pre-Anesthetic Checklist: , timeout performed,  Correct Patient, Correct Site, Correct Laterality,  Correct Procedure, Correct Position, site marked,  Risks and benefits discussed,  Surgical consent,  Pre-op evaluation,  At surgeon's request and post-op pain management  Laterality: Left  Prep: Maximum Sterile Barrier Precautions used, chloraprep       Needles:  Injection technique: Single-shot  Needle Type: Echogenic Stimulator Needle     Needle Length: 9cm  Needle Gauge: 22     Additional Needles:   Procedures:,,,, ultrasound used (permanent image in chart),,    Narrative:  Start time: 08/10/2020 8:20 AM End time: 08/10/2020 8:27 AM Injection made incrementally with aspirations every 5 mL.  Performed by: Personally  Anesthesiologist: Elmer Picker, MD  Additional Notes: Monitors applied. No increased pain on injection. No increased resistance to injection. Injection made in 5cc increments. Good needle visualization. Patient tolerated procedure well.

## 2020-08-10 NOTE — H&P (Signed)
[facility] ([facility]) H&P   Patient ID: Robert [name] MRN: [id number] DOB/AGE: 08/16/1955 65 y.o.  Reason for Surgery-Persistent Left knee pain  HPI: Robert [name] is an 65 y.o. male presents for removal of hardware and knee arthroscopy for persistent left knee pain.  Patient underwent open reduction internal fixation of comminuted patella fracture in November 2021.  He initially did well but has had persistent knee swelling and pain that has been refractory to nonoperative management including injections physical therapy and anti-inflammatories.  He presents for a knee arthroscopy as well as hardware removal.  Past Medical History:  Diagnosis Date   Thyroid disease     Past Surgical History:  Procedure Laterality Date   COLONOSCOPY  1980's   in [facility]   HERNIA REPAIR  03/2014   Umbilical   NO PAST SURGERIES     ORIF PATELLA Left 12/11/2019   Procedure: OPEN REDUCTION INTERNAL (ORIF) FIXATION PATELLA;  Surgeon: Roby Lofts, MD;  Location: [facility] OR;  Service: Orthopedics;  Laterality: Left;    Family History  Problem Relation Age of Onset   Heart Problems Mother    Suicidality Father    Thyroid disease Neg Hx    Colon cancer Neg Hx     Social History:  reports that he quit smoking about 9 years ago. His smoking use included cigarettes. He started smoking about 19 years ago. He smoked an average of .25 packs per day. He has never used smokeless tobacco. He reports that he does not drink alcohol and does not use drugs.  Allergies: No Known Allergies  Medications: I have reviewed the patient's current medications.  ROS: Constitutional: No fever or chills Vision: No changes in vision ENT: No difficulty swallowing CV: No chest pain Pulm: No SOB or wheezing GI: No nausea or vomiting GU: No urgency or inability to hold urine Skin: No poor wound healing Neurologic: No numbness or tingling Psychiatric: No depression or anxiety Heme: No bruising Allergic: No  reaction to medications or food   Exam: Blood pressure 126/77, pulse (!) 58, temperature (!) 97.4 F (36.3 C), temperature source Oral, resp. rate 17, height 5\' 10"  (1.778 m), weight 86.2 kg, SpO2 100 %. General: No acute distress Orientation: Awake alert and oriented x3 Mood and Affect: Cooperative and pleasant Gait: Slightly antalgic gait Coordination and balance: Within normal limits   Left lower extremity: Well-healed anterior incision.  Moderate swelling painful of the knee neurovascular intact.  Right lower extremity skin without lesions. No tenderness to palpation. Full painless ROM, full strength in each muscle groups without evidence of instability.   Medical Decision Making: Data: Imaging: X-rays and CT scan of the left knee show a healed patella fracture without any signs of any hardware failure or loosening.  Labs: No results found for this or any previous visit (from the past 24 hour(s)).   Imaging or Labs ordered: None  Medical history and chart was reviewed and case discussed with medical provider.  Assessment/Plan: 65 year old male status post open reduction internal fixation left patella fracture now with continued pain and swelling and difficulty with ambulation.   I recommend proceeding with knee arthroscopy with the assistance of Dr. 71 with hardware removal.  Risks and benefits were discussed with the patient.  Risks include but not limited to bleeding, infection, persistent pain, persistent swelling, posttraumatic arthritis, even the possibility anesthetic complications.  He agreed to proceed with surgery and consent was obtained.   Everardo Pacific, MD [facility] [phone] (  office) [email or web address]

## 2020-08-10 NOTE — Op Note (Signed)
Orthopaedic Surgery Operative Note (CSN: [id number] ) Date of Surgery: 08/10/2020  Admit Date: 08/10/2020   Diagnoses: Pre-Op Diagnoses: Healed patella fracture Left knee pain  Post-Op Diagnosis: Same  Procedures: CPT 20680-Removal of hardware left knee  Surgeons : Primary: Roby Lofts, MD  Assistant: Darron Doom, RNFA  Location: OR 7   Anesthesia:General with regional block   Antibiotics: Ancef 2g preop   Tourniquet time: Total Tourniquet Time Documented: Thigh (Left) - 57 minutes Total: Thigh (Left) - 57 minutes   Estimated Blood Loss: Minimal  Complications:* No complications entered in OR log *   Specimens:* No specimens in log *   Implants: * No implants in log *   Indications for Surgery: 65 year old male who underwent open reduction internal fixation of comminuted left patella fracture in November 2021.  He initially did well but has subsequently developed a significant pain and difficulty with activities of daily living.  Due to the continued pain I recommend proceeding with hardware removal.  I also recommended arthroscopy for lysis of adhesions and debridement as well as exploration.  Dr. Everardo Pacific was requested to provide this care.  Risks and benefits were discussed with the patient.  He agreed to proceed with surgery and consent was obtained.  Operative Findings: 1.  Removal of hardware left patella without subsequent complication.  Procedure: The patient was identified in the preoperative holding area. Consent was confirmed with the patient and their family and all questions were answered. The operative extremity was marked after confirmation with the patient. he was then brought back to the operating room by our anesthesia colleagues.  He was placed under general anesthetic.  The patient's left lower extremity was prepped and draped in usual sterile fashion.  A timeout was performed to verify the patient, the procedure, and the extremity.  Preoperative  antibiotics were dosed.  Dr. Everardo Pacific went first with his portion of the procedure.  Please see his operative note for full details regarding the arthroscopic surgery.  After he was finished I then took over.  The tourniquet was already inflated.  An anterior approach to the knee was made carried down through skin and subcutaneous tissue.  I then perform subperiosteal dissection to expose the plate and screws.  I remove the screws without difficulty.  I then used a freer elevator to elevate the plate off of the bone and remove this.  An attempt was then made to access the K wires.  Unfortunately they were buried within the bone and I felt that to retrieve them would cause undue damage to the patella and did not feel that they were causing any impingement or problems themselves.  Fluoroscopic imaging was then obtained.  The incision was irrigated.  Layered closure 2-0 Vicryl and 3-0 Monocryl Dermabond was used to close the surgical incision.  Nylon was used to close the arthroscopic portals.  Sterile dressing was applied.  The patient was awoken from anesthesia and taken to the PACU in stable condition.  Post Op Plan/Instructions: Patient will be weightbearing as tolerated to left lower extremity.  He will have unrestricted range of motion of the left knee.  No DVT prophylaxis is needed in this healthy patient.  We will plan to have him return in approximately 2 weeks for wound check and suture removals.  I was present and performed the entire surgery.  Truitt Merle, MD [facility]

## 2020-08-10 NOTE — Anesthesia Procedure Notes (Signed)
Procedure Name: LMA Insertion Date/Time: 08/10/2020 8:41 AM Performed by: De Nurse, CRNA Pre-anesthesia Checklist: Patient identified, Emergency Drugs available, Suction available and Patient being monitored Patient Re-evaluated:Patient Re-evaluated prior to induction Oxygen Delivery Method: Circle System Utilized Preoxygenation: Pre-oxygenation with 100% oxygen Induction Type: IV induction Ventilation: Mask ventilation without difficulty LMA: LMA inserted LMA Size: 5.0 Number of attempts: 1 Placement Confirmation: positive ETCO2 Tube secured with: Tape Dental Injury: Teeth and Oropharynx as per pre-operative assessment

## 2020-08-10 NOTE — Op Note (Addendum)
Orthopaedic Surgery Operative Note (CSN: [id number])  Robert [name]  [birth year] Date of Surgery: 08/10/2020   Diagnoses:  Symptomatic left patella hardware and arthrosis with pain  Procedure: Left knee arthroscopy with chondroplasty of the patella and trochlea as well as lyse adhesion manipulation under anesthesia, partial medial meniscectomy   Operative Finding Exam under anesthesia: Full extension was noted patient had flexion 120 degrees preoperative Suprapatellar pouch: Some adherent scarring but overall looked reasonable Patellofemoral Compartment: Grade 4 cartilage loss along the underside of the patella though the reduction from the patella fracture looked anatomic.  Grade 3 changes in the central portion of the trochlea and extremely tight patellofemoral joint. Medial Compartment: Small parrot-beak type medial meniscus tear, cartilage is intact Lateral Compartment: Normal Intercondylar Notch: Some adhesions but otherwise normal  Successful completion of the planned procedure.  Patient feels this from a elective pain perspective I would add Maj and the patient be a candidate for patellofemoral arthroplasty at minimum.  If he still complains of flexion issues and range of motion issues he may be a candidate for total knee arthroplasty with a total joint surgeon.  Post-operative plan: The patient will be weightbearing to tolerance.  The patient will be discharged home.  DVT prophylaxis per Dr. Jena Gauss.  Pain control with PRN pain medication preferring oral medicines.  Follow up plan will be scheduled in approximately 7 days for incision check.  Post-Op Diagnosis: Same Surgeons:Primary: Roby Lofts, MD co-surgeon Ramond Marrow Location: [facility] OR ROOM 07 Anesthesia: General with block Antibiotics: Ancef 2 g with local vancomycin powder 1 g at the surgical site Tourniquet time:  Total Tourniquet Time Documented: Thigh (Left) - 57 minutes Total: Thigh (Left) - 57 minutes  Estimated Blood  Loss: Minimal Complications: None Specimens: None Implants: * No implants in log *  Indications for Surgery:   Robert [name] is a 65 y.o. male with previous injury resulting in a patella fracture fixed by Dr. Jena Gauss.  Patient went on to complaints of stiffness as well as anterior knee pain and desired a removal of hardware.  Due to his reported arthrofibrosis I was asked to do a lysis of adhesions and scope.  Patient was understanding that I was not able to improve the patient's overall arthritis and this would be partially diagnostic.  Benefits and risks of operative and nonoperative management were discussed prior to surgery with patient/guardian(s) and informed consent form was completed.  Specific risks including infection, need for additional surgery, continued pain, stiffness, need for further surgery including arthroplasty   Procedure:   The patient was identified properly. Informed consent was obtained and the surgical site was marked. The patient was taken up to suite where general anesthesia was induced. The patient was placed in the supine position with a post against the surgical leg and a nonsterile tourniquet applied. The surgical leg was then prepped and draped usual sterile fashion.  A standard surgical timeout was performed.  2 standard anterior portals were made and diagnostic arthroscopy performed. Please note the findings as noted above.  We performed lysed adhesions to the anterior compartment of the knee removing arthrofibrotic tissue taking care to protect the patellar tendon as well as the anterior horn of the medial lateral meniscus.  We then went to the suprapatellar area and release scar.  We went to the patellofemoral joint and performed a chondroplasty of the trochlea primarily as there was grade 4 cartilage loss on the underside of the patella but no obvious loose cartilage.  Partial medial  meniscectomy was performed with a shaver and a basket back to a stable  base.  Patient was handed off to Dr. Jena Gauss at this point for his portion of the procedure.  This case due to complexity required to fellowship trained orthopedic surgeons to perform each individual portion of the case.

## 2020-08-10 NOTE — Transfer of Care (Signed)
Immediate Anesthesia Transfer of Care Note  Patient: Robert [name]  Procedure(s) Performed: HARDWARE REMOVAL (Left) ARTHROSCOPY KNEE WITH CHONDROPLASTY; PARTIAL MEDIAL MENISCECTOMY LYSIS OF ADHESIONS AND MANIPULATION (Left: Knee)  Patient Location: PACU  Anesthesia Type:General and Regional  Level of Consciousness: awake, alert  and oriented  Airway & Oxygen Therapy: Patient Spontanous Breathing  Post-op Assessment: Report given to RN, Post -op Vital signs reviewed and stable and Patient moving all extremities  Post vital signs: Reviewed and stable  Last Vitals:  Vitals Value Taken Time  BP 144/102 08/10/20 1004  Temp    Pulse 67 08/10/20 1005  Resp 26 08/10/20 1005  SpO2 100 % 08/10/20 1005  Vitals shown include unvalidated device data.  Last Pain:  Vitals:   08/10/20 0702  TempSrc:   PainSc: 5       Patients Stated Pain Goal: 3 (08/10/20 5498)  Complications: No notable events documented.

## 2020-08-10 NOTE — Anesthesia Postprocedure Evaluation (Signed)
Anesthesia Post Note  Patient: Kashaun Bebo  Procedure(s) Performed: HARDWARE REMOVAL (Left) ARTHROSCOPY KNEE WITH CHONDROPLASTY; PARTIAL MEDIAL MENISCECTOMY LYSIS OF ADHESIONS AND MANIPULATION (Left: Knee)     Patient location during evaluation: PACU Anesthesia Type: General and Regional Level of consciousness: awake and alert Pain management: pain level controlled Vital Signs Assessment: post-procedure vital signs reviewed and stable Respiratory status: spontaneous breathing, nonlabored ventilation, respiratory function stable and patient connected to nasal cannula oxygen Cardiovascular status: blood pressure returned to baseline and stable Postop Assessment: no apparent nausea or vomiting Anesthetic complications: no   No notable events documented.  Last Vitals:  Vitals:   08/10/20 1019 08/10/20 1034  BP: (!) 138/91 (!) 142/95  Pulse:  (!) 49  Resp:  13  Temp:  36.8 C  SpO2:  97%    Last Pain:  Vitals:   08/10/20 1034  TempSrc:   PainSc: 0-No pain                 Anab Vivar L Sebastian Lurz

## 2020-08-10 NOTE — Interval H&P Note (Signed)
History and Physical Interval Note:  08/10/2020 8:19 AM  Robert [name]  has presented today for surgery, with the diagnosis of SYMPTOMATIC HARDWARE LEFT PATELLA.  The various methods of treatment have been discussed with the patient and family. After consideration of risks, benefits and other options for treatment, the patient has consented to  Procedure(s): HARDWARE REMOVAL (Left) ARTHROSCOPY KNEE WITH CHONDROPLASTY; LYSIS OF ADHESIONS AND MANIPULATION (Right) as a surgical intervention.  The patient's history has been reviewed, patient examined, no change in status, stable for surgery.  I have reviewed the patient's chart and labs.  Questions were answered to the patient's satisfaction.     Caryn Bee P Austine Wiedeman

## 2020-08-10 NOTE — Anesthesia Preprocedure Evaluation (Addendum)
Anesthesia Evaluation  Patient identified by MRN, date of birth, ID band Patient awake    Reviewed: Allergy & Precautions, NPO status , Patient's Chart, lab work & pertinent test results  Airway Mallampati: II  TM Distance: >3 FB Neck ROM: Full    Dental no notable dental hx. (+) Teeth Intact, Dental Advisory Given   Pulmonary neg pulmonary ROS, former smoker,    Pulmonary exam normal breath sounds clear to auscultation       Cardiovascular negative cardio ROS Normal cardiovascular exam Rhythm:Regular Rate:Normal     Neuro/Psych negative neurological ROS  negative psych ROS   GI/Hepatic negative GI ROS, Neg liver ROS,   Endo/Other  Hypothyroidism   Renal/GU negative Renal ROS  negative genitourinary   Musculoskeletal negative musculoskeletal ROS (+)   Abdominal   Peds  Hematology negative hematology ROS (+)   Anesthesia Other Findings   Reproductive/Obstetrics                            Anesthesia Physical Anesthesia Plan  ASA: 2  Anesthesia Plan: General   Post-op Pain Management:    Induction: Intravenous  PONV Risk Score and Plan: 2 and Ondansetron, Dexamethasone and Midazolam  Airway Management Planned: LMA  Additional Equipment:   Intra-op Plan:   Post-operative Plan: Extubation in OR  Informed Consent: I have reviewed the patients History and Physical, chart, labs and discussed the procedure including the risks, benefits and alternatives for the proposed anesthesia with the patient or authorized representative who has indicated his/her understanding and acceptance.     Dental advisory given  Plan Discussed with: CRNA  Anesthesia Plan Comments:         Anesthesia Quick Evaluation

## 2020-08-11 ENCOUNTER — Encounter (HOSPITAL_COMMUNITY): Payer: Self-pay | Admitting: Student

## 2020-08-18 DIAGNOSIS — M24662 Ankylosis, left knee: Secondary | ICD-10-CM | POA: Diagnosis not present

## 2020-08-22 DIAGNOSIS — S82002D Unspecified fracture of left patella, subsequent encounter for closed fracture with routine healing: Secondary | ICD-10-CM | POA: Diagnosis not present

## 2020-10-19 ENCOUNTER — Other Ambulatory Visit: Payer: Self-pay

## 2020-10-19 ENCOUNTER — Ambulatory Visit (INDEPENDENT_AMBULATORY_CARE_PROVIDER_SITE_OTHER): Payer: Medicare HMO | Admitting: Family Medicine

## 2020-10-19 ENCOUNTER — Encounter: Payer: Self-pay | Admitting: Family Medicine

## 2020-10-19 VITALS — BP 118/68 | HR 73 | Ht 70.0 in | Wt 194.0 lb

## 2020-10-19 DIAGNOSIS — E782 Mixed hyperlipidemia: Secondary | ICD-10-CM

## 2020-10-19 DIAGNOSIS — E890 Postprocedural hypothyroidism: Secondary | ICD-10-CM

## 2020-10-19 DIAGNOSIS — Z125 Encounter for screening for malignant neoplasm of prostate: Secondary | ICD-10-CM

## 2020-10-19 DIAGNOSIS — E785 Hyperlipidemia, unspecified: Secondary | ICD-10-CM | POA: Insufficient documentation

## 2020-10-19 MED ORDER — ATORVASTATIN CALCIUM 20 MG PO TABS: 20.0000 mg | ORAL_TABLET | Freq: Every day | ORAL | 3 refills | Status: DC

## 2020-10-19 NOTE — Progress Notes (Signed)
BP 118/68   Pulse 73   Ht _0  (1.778 m)   Wt 194 lb (88 kg)   SpO2 99%   BMI 27.84 kg/m    Subjective:   Patient ID: Robert [name], male    DOB: 1955/05/25, 65 y.o.   MRN: [id number]  HPI: Robert [name] is a 65 y.o. male presenting on 10/19/2020 for Medical Management of Chronic Issues, Hypothyroidism, and Hyperlipidemia   HPI Hyperlipidemia Patient is coming in for recheck of his hyperlipidemia. The patient is currently taking atorvastatin. They deny any issues with myalgias or history of liver damage from it. They deny any focal numbness or weakness or chest pain.   Hypothyroidism recheck Patient is coming in for thyroid recheck today as well. They deny any issues with hair changes or heat or cold problems or diarrhea or constipation. They deny any chest pain or palpitations. They are currently on levothyroxine 112 micrograms   Patient says he is going for a knee replacement surgery sometime in the future but has not had a set date or scheduled, just thinking about it right now.  Relevant past medical, surgical, family and social history reviewed and updated as indicated. Interim medical history since our last visit reviewed. Allergies and medications reviewed and updated.  Review of Systems  Constitutional:  Negative for chills and fever.  Eyes:  Negative for visual disturbance.  Respiratory:  Negative for shortness of breath and wheezing.   Cardiovascular:  Negative for chest pain and leg swelling.  Genitourinary:  Negative for decreased urine volume and difficulty urinating.  Musculoskeletal:  Negative for back pain and gait problem.  Skin:  Negative for rash.  All other systems reviewed and are negative.  Per HPI unless specifically indicated above   Allergies as of 10/19/2020   No Known Allergies      Medication List        Accurate as of October 19, 2020  8:54 AM. If you have any questions, ask your nurse or doctor.          acetaminophen 325 MG  tablet Commonly known as: TYLENOL Take 650 mg by mouth every 6 (six) hours as needed for moderate pain.   atorvastatin 20 MG tablet Commonly known as: LIPITOR Take 1 tablet (20 mg total) by mouth daily at 4 PM. What changed: See the new instructions. Changed by: Fransisca Kaufmann Shivon Hackel, MD   fluticasone 50 MCG/ACT nasal spray Commonly known as: FLONASE Place 2 sprays into both nostrils daily. What changed:  when to take this reasons to take this   HYDROcodone-acetaminophen 7.5-325 MG tablet Commonly known as: NORCO Take 1 tablet by mouth every 4 (four) hours as needed.   ibuprofen 200 MG tablet Commonly known as: ADVIL Take 200 mg by mouth every 6 (six) hours as needed for headache or mild pain.   levothyroxine 112 MCG tablet Commonly known as: SYNTHROID Take 1 tablet (112 mcg total) by mouth daily.   ONE-A-DAY MENS 50+ ADVANTAGE PO Take 1 tablet by mouth daily.         Objective:   BP 118/68   Pulse 73   Ht _1  (1.778 m)   Wt 194 lb (88 kg)   SpO2 99%   BMI 27.84 kg/m   Wt Readings from Last 3 Encounters:  10/19/20 194 lb (88 kg)  08/10/20 190 lb (86.2 kg)  06/14/20 194 lb (88 kg)    Physical Exam Vitals and nursing note reviewed.  Constitutional:  General: He is not in acute distress.    Appearance: He is well-developed. He is not diaphoretic.  Eyes:     General: No scleral icterus.    Conjunctiva/sclera: Conjunctivae normal.  Neck:     Thyroid: No thyromegaly.  Cardiovascular:     Rate and Rhythm: Normal rate and regular rhythm.     Heart sounds: Normal heart sounds. No murmur heard. Pulmonary:     Effort: Pulmonary effort is normal. No respiratory distress.     Breath sounds: Normal breath sounds. No wheezing.  Genitourinary:    Prostate: Normal.     Rectum: Normal.  Musculoskeletal:        General: Normal range of motion.     Cervical back: Neck supple.  Lymphadenopathy:     Cervical: No cervical adenopathy.  Skin:    General: Skin is  warm and dry.     Findings: No rash.  Neurological:     Mental Status: He is alert and oriented to person, place, and time.     Coordination: Coordination normal.  Psychiatric:        Behavior: Behavior normal.      Assessment & Plan:   Problem List Items Addressed This Visit       Endocrine   Hypothyroidism following radioiodine therapy - Primary   Relevant Orders   CBC with Differential/Platelet   CMP14+EGFR   TSH     Other   Hyperlipidemia   Relevant Medications   atorvastatin (LIPITOR) 20 MG tablet   Other Relevant Orders   CBC with Differential/Platelet   CMP14+EGFR   Lipid panel   Other Visit Diagnoses     Prostate cancer screening       Relevant Orders   PSA, total and free       Continue current medicine, will check blood work.  We will check PSA Follow up plan: Return in about 6 months (around 04/18/2021), or if symptoms worsen or fail to improve, for Physical and thyroid and cholesterol.  Counseling provided for all of the vaccine components Orders Placed This Encounter  Procedures   CBC with Differential/Platelet   CMP14+EGFR   Lipid panel   TSH   PSA, total and free    Caryl Pina, MD [facility] 10/19/2020, 8:54 AM

## 2020-10-20 LAB — CBC WITH DIFFERENTIAL/PLATELET
Basophils Absolute: 0 10*3/uL (ref 0.0–0.2)
Basos: 0 %
EOS (ABSOLUTE): 0.1 10*3/uL (ref 0.0–0.4)
Eos: 3 %
Hematocrit: 40.6 % (ref 37.5–51.0)
Hemoglobin: 13.8 g/dL (ref 13.0–17.7)
Immature Grans (Abs): 0 10*3/uL (ref 0.0–0.1)
Immature Granulocytes: 0 %
Lymphocytes Absolute: 1.4 10*3/uL (ref 0.7–3.1)
Lymphs: 33 %
MCH: 31.7 pg (ref 26.6–33.0)
MCHC: 34 g/dL (ref 31.5–35.7)
MCV: 93 fL (ref 79–97)
Monocytes Absolute: 0.4 10*3/uL (ref 0.1–0.9)
Monocytes: 10 %
Neutrophils Absolute: 2.3 10*3/uL (ref 1.4–7.0)
Neutrophils: 54 %
Platelets: 169 10*3/uL (ref 150–450)
RBC: 4.36 x10E6/uL (ref 4.14–5.80)
RDW: 13.2 % (ref 11.6–15.4)
WBC: 4.3 10*3/uL (ref 3.4–10.8)

## 2020-10-20 LAB — PSA, TOTAL AND FREE
PSA, Free Pct: 17.8 %
PSA, Free: 0.48 ng/mL
Prostate Specific Ag, Serum: 2.7 ng/mL (ref 0.0–4.0)

## 2020-10-20 LAB — CMP14+EGFR
ALT: 13 IU/L (ref 0–44)
AST: 30 IU/L (ref 0–40)
Albumin/Globulin Ratio: 1.4 (ref 1.2–2.2)
Albumin: 4.6 g/dL (ref 3.8–4.8)
Alkaline Phosphatase: 83 IU/L (ref 44–121)
BUN/Creatinine Ratio: 8 — ABNORMAL LOW (ref 10–24)
BUN: 8 mg/dL (ref 8–27)
Bilirubin Total: 0.4 mg/dL (ref 0.0–1.2)
CO2: 22 mmol/L (ref 20–29)
Calcium: 8.9 mg/dL (ref 8.6–10.2)
Chloride: 101 mmol/L (ref 96–106)
Creatinine, Ser: 1.05 mg/dL (ref 0.76–1.27)
Globulin, Total: 3.2 g/dL (ref 1.5–4.5)
Glucose: 79 mg/dL (ref 70–99)
Potassium: 3.6 mmol/L (ref 3.5–5.2)
Sodium: 139 mmol/L (ref 134–144)
Total Protein: 7.8 g/dL (ref 6.0–8.5)
eGFR: 78 mL/min/{1.73_m2} (ref >59)

## 2020-10-20 LAB — LIPID PANEL
Chol/HDL Ratio: 2.9 ratio (ref 0.0–5.0)
Cholesterol, Total: 171 mg/dL (ref 100–199)
HDL: 59 mg/dL (ref >39)
LDL Chol Calc (NIH): 97 mg/dL (ref 0–99)
Triglycerides: 78 mg/dL (ref 0–149)
VLDL Cholesterol Cal: 15 mg/dL (ref 5–40)

## 2020-10-20 LAB — TSH: TSH: 11.6 u[IU]/mL — ABNORMAL HIGH (ref 0.450–4.500)

## 2020-10-24 ENCOUNTER — Other Ambulatory Visit: Payer: Self-pay

## 2020-10-24 DIAGNOSIS — E890 Postprocedural hypothyroidism: Secondary | ICD-10-CM

## 2020-10-24 MED ORDER — LEVOTHYROXINE SODIUM 125 MCG PO TABS: 125.0000 ug | ORAL_TABLET | Freq: Every day | ORAL | 1 refills | Status: DC

## 2020-10-31 ENCOUNTER — Other Ambulatory Visit: Payer: Self-pay

## 2020-10-31 ENCOUNTER — Ambulatory Visit (INDEPENDENT_AMBULATORY_CARE_PROVIDER_SITE_OTHER): Payer: Medicare HMO | Admitting: Nurse Practitioner

## 2020-10-31 ENCOUNTER — Encounter: Payer: Self-pay | Admitting: Nurse Practitioner

## 2020-10-31 VITALS — BP 127/79 | HR 68 | Temp 97.8°F | Ht 70.0 in | Wt 199.0 lb

## 2020-10-31 DIAGNOSIS — L0291 Cutaneous abscess, unspecified: Secondary | ICD-10-CM

## 2020-10-31 MED ORDER — CEPHALEXIN 500 MG PO CAPS: 500.0000 mg | ORAL_CAPSULE | Freq: Two times a day (BID) | ORAL | 0 refills | Status: DC

## 2020-10-31 NOTE — Assessment & Plan Note (Signed)
Left testicle abscess not well controlled in the past 4 days.  Started patient on Keflex by mouth for 7 days, warm compress, ibuprofen as tolerated for pain.  Follow-up in 7 days.  Education provided to patient printed handouts given.

## 2020-10-31 NOTE — Patient Instructions (Signed)
Skin Abscess  A skin abscess is an infected area on or under your skin that contains a collection of pus and other material. An abscess may also be called a furuncle,carbuncle, or boil. An abscess can occur in or on almost any part of your body. Some abscesses break open (rupture) on their own. Most continue to get worse unless they are treated. The infection can spread deeper into the body and eventually into your blood, whichcan make you feel ill. Treatment usually involves draining the abscess. What are the causes? An abscess occurs when germs, like bacteria, pass through your skin and cause an infection. This may be caused by: A scrape or cut on your skin. A puncture wound through your skin, including a needle injection or insect bite. Blocked oil or sweat glands. Blocked and infected hair follicles. A cyst that forms beneath your skin (sebaceous cyst) and becomes infected. What increases the risk? This condition is more likely to develop in people who: Have a weak body defense system (immune system). Have diabetes. Have dry and irritated skin. Get frequent injections or use illegal IV drugs. Have a foreign body in a wound, such as a splinter. Have problems with their lymph system or veins. What are the signs or symptoms? Symptoms of this condition include: A painful, firm bump under the skin. A bump with pus at the top. This may break through the skin and drain. Other symptoms include: Redness surrounding the abscess site. Warmth. Swelling of the lymph nodes (glands) near the abscess. Tenderness. A sore on the skin. How is this diagnosed? This condition may be diagnosed based on: A physical exam. Your medical history. A sample of pus. This may be used to find out what is causing the infection. Blood tests. Imaging tests, such as an ultrasound, CT scan, or MRI. How is this treated? A small abscess that drains on its own may not need treatment. Treatment for larger abscesses  may include: Moist heat or heat pack applied to the area several times a day. A procedure to drain the abscess (incision and drainage). Antibiotic medicines. For a severe abscess, you may first get antibiotics through an IV and then change to antibiotics by mouth. Follow these instructions at home: Medicines  Take over-the-counter and prescription medicines only as told by your health care provider. If you were prescribed an antibiotic medicine, take it as told by your health care provider. Do not stop taking the antibiotic even if you start to feel better.  Abscess care  If you have an abscess that has not drained, apply heat to the affected area. Use the heat source that your health care provider recommends, such as a moist heat pack or a heating pad. Place a towel between your skin and the heat source. Leave the heat on for 20-30 minutes. Remove the heat if your skin turns bright red. This is especially important if you are unable to feel pain, heat, or cold. You may have a greater risk of getting burned. Follow instructions from your health care provider about how to take care of your abscess. Make sure you: Cover the abscess with a bandage (dressing). Change your dressing or gauze as told by your health care provider. Wash your hands with soap and water before you change the dressing or gauze. If soap and water are not available, use hand sanitizer. Check your abscess every day for signs of a worsening infection. Check for: More redness, swelling, or pain. More fluid or blood. Warmth. More   pus or a bad smell.  General instructions To avoid spreading the infection: Do not share personal care items, towels, or hot tubs with others. Avoid making skin contact with other people. Keep all follow-up visits as told by your health care provider. This is important. Contact a health care provider if you have: More redness, swelling, or pain around your abscess. More fluid or blood coming  from your abscess. Warm skin around your abscess. More pus or a bad smell coming from your abscess. A fever. Muscle aches. Chills or a general ill feeling. Get help right away if you: Have severe pain. See red streaks on your skin spreading away from the abscess. Summary A skin abscess is an infected area on or under your skin that contains a collection of pus and other material. A small abscess that drains on its own may not need treatment. Treatment for larger abscesses may include having a procedure to drain the abscess and taking an antibiotic. This information is not intended to replace advice given to you by your health care provider. Make sure you discuss any questions you have with your healthcare provider. Document Revised: 05/01/2018 Document Reviewed: 02/21/2017 Elsevier Patient Education  2022 Elsevier Inc.  

## 2020-10-31 NOTE — Progress Notes (Signed)
Acute Office Visit  Subjective:    Patient ID: Robert [name], male    DOB: 22-Jan-1956, 65 y.o.   MRN: [id number]  Chief Complaint  Patient presents with   Abscess    Abscess This is a recurrent problem. Episode onset: in the past 4 days. The problem occurs constantly. The problem has been gradually worsening. Pertinent negatives include no abdominal pain, anorexia, chills, fever, nausea, rash or sore throat. Nothing aggravates the symptoms. He has tried nothing for the symptoms.   Past Medical History:  Diagnosis Date   Thyroid disease     Past Surgical History:  Procedure Laterality Date   COLONOSCOPY  1980's   in [facility]   HARDWARE REMOVAL Left 08/10/2020   Procedure: HARDWARE REMOVAL;  Surgeon: Shona Needles, MD;  Location: [facility];  Service: Orthopedics;  Laterality: Left;   HERNIA REPAIR  09/3265   Umbilical   KNEE ARTHROSCOPY Left 08/10/2020   Procedure: ARTHROSCOPY KNEE WITH CHONDROPLASTY; PARTIAL MEDIAL MENISCECTOMY LYSIS OF ADHESIONS AND MANIPULATION;  Surgeon: Hiram Gash, MD;  Location: [facility];  Service: Orthopedics;  Laterality: Left;   NO PAST SURGERIES     ORIF PATELLA Left 12/11/2019   Procedure: OPEN REDUCTION INTERNAL (ORIF) FIXATION PATELLA;  Surgeon: Shona Needles, MD;  Location: [facility];  Service: Orthopedics;  Laterality: Left;    Family History  Problem Relation Age of Onset   Heart Problems Mother    Suicidality Father    Thyroid disease Neg Hx    Colon cancer Neg Hx     Social History   Socioeconomic History   Marital status: Single    Spouse name: Not on file   Number of children: 0   Years of education: Not on file   Highest education level: Not on file  Occupational History   Occupation: administer  Tobacco Use   Smoking status: Former    Packs/day: 0.25    Types: Cigarettes    Start date: 12/08/2000    Quit date: 12/09/2010    Years since quitting: 9.9   Smokeless tobacco: Never  Vaping Use   Vaping Use: Never used   Substance and Sexual Activity   Alcohol use: Never   Drug use: Never   Sexual activity: Not Currently  Other Topics Concern   Not on file  Social History Narrative   ** Merged History Encounter **   Lives alone; 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   Brother passed away 05-May-2020 in a house fire - has nieces and nephews nearby   Social Determinants of Health   Financial Resource Strain: Low Risk    Difficulty of Paying Living Expenses: Not hard at all  Food Insecurity: No Food Insecurity   Worried About Charity fundraiser in the Last Year: Never true   Arboriculturist in the Last Year: Never true  Transportation Needs: No Transportation Needs   Lack of Transportation (Medical): No   Lack of Transportation (Non-Medical): No  Physical Activity: Sufficiently Active   Days of Exercise per Week: 4 days   Minutes of Exercise per Session: 60 min  Stress: No Stress Concern Present   Feeling of Stress : Only a little  Social Connections: Moderately Integrated   Frequency of Communication with Friends and Family: More than three times a week   Frequency of Social Gatherings with Friends and Family: Twice a week   Attends Religious Services: More than 4 times per year   Active Member  of Clubs or Organizations: Yes   Attends Music therapist: More than 4 times per year   Marital Status: Never married  Intimate Partner Violence: Not At Risk   Fear of Current or Ex-Partner: No   Emotionally Abused: No   Physically Abused: No   Sexually Abused: No    Outpatient Medications Prior to Visit  Medication Sig Dispense Refill   acetaminophen (TYLENOL) 325 MG tablet Take 650 mg by mouth every 6 (six) hours as needed for moderate pain.     atorvastatin (LIPITOR) 20 MG tablet Take 1 tablet (20 mg total) by mouth daily at 4 PM. 90 tablet 3   fluticasone (FLONASE) 50 MCG/ACT nasal spray Place 2 sprays into both nostrils daily. (Patient taking differently: Place 2 sprays into both  nostrils daily as needed for allergies or rhinitis.) 16 g 6   HYDROcodone-acetaminophen (NORCO) 7.5-325 MG tablet Take 1 tablet by mouth every 4 (four) hours as needed. 42 tablet 0   ibuprofen (ADVIL) 200 MG tablet Take 200 mg by mouth every 6 (six) hours as needed for headache or mild pain.     levothyroxine (SYNTHROID) 125 MCG tablet Take 1 tablet (125 mcg total) by mouth daily. 90 tablet 1   Multiple Vitamins-Minerals (ONE-A-DAY MENS 50+ ADVANTAGE PO) Take 1 tablet by mouth daily.     No facility-administered medications prior to visit.    No Known Allergies  Review of Systems  Constitutional: Negative.  Negative for chills and fever.  HENT: Negative.  Negative for sore throat.   Respiratory: Negative.    Gastrointestinal:  Negative for abdominal pain, anorexia and nausea.  Genitourinary: Negative.   Skin:  Negative for rash.  All other systems reviewed and are negative.     Objective:    Physical Exam Vitals and nursing note reviewed.  Constitutional:      Appearance: Normal appearance. He is normal weight.  HENT:     Head: Normocephalic.     Right Ear: Ear canal and external ear normal.     Nose: Nose normal.     Mouth/Throat:     Mouth: Mucous membranes are moist.     Pharynx: Oropharynx is clear.  Eyes:     Conjunctiva/sclera: Conjunctivae normal.  Cardiovascular:     Rate and Rhythm: Normal rate and regular rhythm.     Pulses: Normal pulses.     Heart sounds: Normal heart sounds.  Pulmonary:     Effort: Pulmonary effort is normal.     Breath sounds: Normal breath sounds.  Abdominal:     General: Bowel sounds are normal.  Skin:    General: Skin is warm.     Findings: Abscess present.     Comments: Left scrotal abscess   Neurological:     Mental Status: He is alert and oriented to person, place, and time.  Psychiatric:        Behavior: Behavior normal.    BP 127/79   Pulse 68   Temp 97.8 F (36.6 C) (Temporal)   Ht '5\' 10"'  (1.778 m)   Wt 199 lb (90.3  kg)   BMI 28.55 kg/m  Wt Readings from Last 3 Encounters:  10/31/20 199 lb (90.3 kg)  10/19/20 194 lb (88 kg)  08/10/20 190 lb (86.2 kg)       Lab Results  Component Value Date   TSH 11.600 (H) 10/19/2020   Lab Results  Component Value Date   WBC 4.3 10/19/2020   HGB 13.8 10/19/2020  HCT 40.6 10/19/2020   MCV 93 10/19/2020   PLT 169 10/19/2020   Lab Results  Component Value Date   NA 139 10/19/2020   K 3.6 10/19/2020   CO2 22 10/19/2020   GLUCOSE 79 10/19/2020   BUN 8 10/19/2020   CREATININE 1.05 10/19/2020   BILITOT 0.4 10/19/2020   ALKPHOS 83 10/19/2020   AST 30 10/19/2020   ALT 13 10/19/2020   PROT 7.8 10/19/2020   ALBUMIN 4.6 10/19/2020   CALCIUM 8.9 10/19/2020   ANIONGAP 10 12/10/2019   EGFR 78 10/19/2020   Lab Results  Component Value Date   CHOL 171 10/19/2020   Lab Results  Component Value Date   HDL 59 10/19/2020   Lab Results  Component Value Date   LDLCALC 97 10/19/2020   Lab Results  Component Value Date   TRIG 78 10/19/2020   Lab Results  Component Value Date   CHOLHDL 2.9 10/19/2020   No results found for: HGBA1C     Assessment & Plan:   Problem List Items Addressed This Visit       Other   Abscess - Primary    Left testicle abscess not well controlled in the past 4 days.  Started patient on Keflex by mouth for 7 days, warm compress, ibuprofen as tolerated for pain.  Follow-up in 7 days.  Education provided to patient printed handouts given.      Relevant Medications   cephALEXin (KEFLEX) 500 MG capsule     Meds ordered this encounter  Medications   cephALEXin (KEFLEX) 500 MG capsule    Sig: Take 1 capsule (500 mg total) by mouth 2 (two) times daily.    Dispense:  14 capsule    Refill:  0    Order Specific Question:   Supervising Provider    Answer:   Janora Norlander [[id number]]     Ivy Lynn, NP

## 2020-11-14 ENCOUNTER — Other Ambulatory Visit: Payer: Self-pay

## 2020-11-14 ENCOUNTER — Encounter: Payer: Self-pay | Admitting: Nurse Practitioner

## 2020-11-14 ENCOUNTER — Ambulatory Visit (INDEPENDENT_AMBULATORY_CARE_PROVIDER_SITE_OTHER): Payer: Medicare HMO | Admitting: Nurse Practitioner

## 2020-11-14 VITALS — BP 130/81 | HR 68 | Temp 98.0°F | Resp 20 | Ht 70.0 in | Wt 202.0 lb

## 2020-11-14 DIAGNOSIS — R319 Hematuria, unspecified: Secondary | ICD-10-CM

## 2020-11-14 DIAGNOSIS — E890 Postprocedural hypothyroidism: Secondary | ICD-10-CM

## 2020-11-14 LAB — URINALYSIS, COMPLETE
Bilirubin, UA: NEGATIVE
Glucose, UA: NEGATIVE
Ketones, UA: NEGATIVE
Leukocytes,UA: NEGATIVE
Nitrite, UA: NEGATIVE
RBC, UA: NEGATIVE
Specific Gravity, UA: >1.03 — ABNORMAL HIGH (ref 1.005–1.030)
Urobilinogen, Ur: 0.2 mg/dL (ref 0.2–1.0)
pH, UA: 6 (ref 5.0–7.5)

## 2020-11-14 LAB — MICROSCOPIC EXAMINATION
Epithelial Cells (non renal): NONE SEEN /hpf (ref 0–10)
Renal Epithel, UA: NONE SEEN /hpf

## 2020-11-14 NOTE — Progress Notes (Signed)
   Subjective:    Patient ID: Robert [name], male    DOB: 1955/01/24, 65 y.o.   MRN: [id number]   Chief Complaint: Hematuria   HPI Patient says he saw a drop of blood Friday. Has not seen any since. Has slight burning at head of penis when he voids. Denies urgency or frequency. No trouble with stream.     Review of Systems  Constitutional:  Negative for diaphoresis.  Eyes:  Negative for pain.  Respiratory:  Negative for shortness of breath.   Cardiovascular:  Negative for chest pain, palpitations and leg swelling.  Gastrointestinal:  Negative for abdominal pain.  Endocrine: Negative for polydipsia.  Genitourinary:  Positive for dysuria (very mild) and hematuria (?). Negative for frequency, penile pain and penile swelling.  Skin:  Negative for rash.  Neurological:  Negative for dizziness, weakness and headaches.  Hematological:  Does not bruise/bleed easily.  All other systems reviewed and are negative.     Objective:   Physical Exam Vitals and nursing note reviewed.  Constitutional:      Appearance: Normal appearance.  Cardiovascular:     Rate and Rhythm: Normal rate and regular rhythm.     Pulses: Normal pulses.     Heart sounds: Normal heart sounds.  Genitourinary:    Penis: Normal.   Neurological:     Mental Status: He is alert.    BP 130/81   Pulse 68   Temp 98 F (36.7 C) (Temporal)   Resp 20   Ht 5\' 10"  (1.778 m)   Wt 202 lb (91.6 kg)   SpO2 96%   BMI 28.98 kg/m   Urine clear      Assessment & Plan:  Robert [name] in today with chief complaint of Hematuria   1. Hematuria, unspecified type Force fluids Cranberry juice Keep follow up appointment with PCP - Urinalysis, Complete    The above assessment and management plan was discussed with the patient. The patient verbalized understanding of and has agreed to the management plan. Patient is aware to call the clinic if symptoms persist or worsen. Patient is aware when to return to the clinic for a  follow-up visit. Patient educated on when it is appropriate to go to the emergency department.   Mary-Margaret [name], FNP

## 2020-11-15 LAB — TSH: TSH: 7.56 u[IU]/mL — ABNORMAL HIGH (ref 0.450–4.500)

## 2020-11-17 ENCOUNTER — Other Ambulatory Visit: Payer: Self-pay

## 2020-11-17 MED ORDER — LEVOTHYROXINE SODIUM 137 MCG PO TABS: 137.0000 ug | ORAL_TABLET | Freq: Every day | ORAL | 1 refills | Status: DC

## 2020-11-22 ENCOUNTER — Ambulatory Visit (INDEPENDENT_AMBULATORY_CARE_PROVIDER_SITE_OTHER): Payer: Medicare HMO | Admitting: Family Medicine

## 2020-11-22 ENCOUNTER — Encounter: Payer: Self-pay | Admitting: Family Medicine

## 2020-11-22 VITALS — BP 154/92 | HR 99 | Temp 97.8°F | Resp 20 | Ht 70.0 in | Wt 194.0 lb

## 2020-11-22 DIAGNOSIS — T7840XA Allergy, unspecified, initial encounter: Secondary | ICD-10-CM | POA: Diagnosis not present

## 2020-11-22 DIAGNOSIS — T783XXA Angioneurotic edema, initial encounter: Secondary | ICD-10-CM | POA: Diagnosis not present

## 2020-11-22 MED ORDER — BETAMETHASONE SOD PHOS & ACET 6 (3-3) MG/ML IJ SUSP
6.0000 mg | Freq: Once | INTRAMUSCULAR | Status: AC
Start: 2020-11-22 — End: 2020-11-22
  Administered 2020-11-22: 6 mg via INTRAMUSCULAR

## 2020-11-22 MED ORDER — DIPHENHYDRAMINE HCL 50 MG/ML IJ SOLN
25.0000 mg | Freq: Once | INTRAMUSCULAR | Status: AC
  Administered 2020-11-22: 25 mg via INTRAMUSCULAR

## 2020-11-22 NOTE — Progress Notes (Signed)
Subjective:  Patient ID: Robert [name], male    DOB: 1955-07-13  Age: 65 y.o. MRN: [id number]  CC: Allergic Reaction   HPI Robert [name] presents for swelling in the face and lip. It started a couple of hours ago and has caused increasing swelling at the lip. There is no dyspnea, palpitations or chest discomfort. Robert [name] has some itching but no rash.  Robert [name] does not take an ACE or ARB. His thyroid med dose was increased last week.   Depression screen [facility] 2/9 11/22/2020 11/14/2020 10/19/2020  Decreased Interest 0 0 0  Down, Depressed, Hopeless 0 0 0  PHQ - 2 Score 0 0 0  Altered sleeping 0 0 -  Tired, decreased energy 0 0 -  Change in appetite 0 0 -  Feeling bad or failure about yourself  0 0 -  Trouble concentrating 0 0 -  Moving slowly or fidgety/restless 0 0 -  Suicidal thoughts 0 0 -  PHQ-9 Score 0 0 -  Difficult doing work/chores - Not difficult at all -    History Robert [name] has a past medical history of Thyroid disease.   Robert [name] has a past surgical history that includes No past surgeries; Colonoscopy (1980's); Hernia repair (03/2014); ORIF patella (Left, 12/11/2019); Hardware Removal (Left, 08/10/2020); and Knee arthroscopy (Left, 08/10/2020).   His family history includes Heart Problems in his mother; Suicidality in his father.Robert [name] reports that Robert [name] quit smoking about 9 years ago. His smoking use included cigarettes. Robert [name] started smoking about 19 years ago. Robert [name] smoked an average of .25 packs per day. Robert [name] has never used smokeless tobacco. Robert [name] reports that Robert [name] does not drink alcohol and does not use drugs.    ROS Review of Systems  Constitutional:  Negative for fever.  HENT:  Positive for voice change (mild hoarseness). Negative for sore throat, tinnitus and trouble swallowing.   Respiratory:  Negative for chest tightness, shortness of breath and wheezing.   Cardiovascular:  Negative for chest pain and palpitations.  Gastrointestinal:  Negative for nausea.  Musculoskeletal:  Negative for arthralgias.   Skin:  Negative for rash.   Objective:  BP (!) 154/92   Pulse 99   Temp 97.8 F (36.6 C) (Temporal)   Resp 20   Ht 5\' 10"  (1.778 m)   Wt 194 lb (88 kg)   SpO2 98%   BMI 27.84 kg/m   BP Readings from Last 3 Encounters:  11/22/20 (!) 154/92  11/14/20 130/81  10/31/20 127/79    Wt Readings from Last 3 Encounters:  11/22/20 194 lb (88 kg)  11/14/20 202 lb (91.6 kg)  10/31/20 199 lb (90.3 kg)     Physical Exam Vitals reviewed.  Constitutional:      Appearance: Robert [name] is well-developed.  HENT:     Head: Normocephalic and atraumatic.     Right Ear: External ear normal.     Left Ear: External ear normal.     Mouth/Throat:     Mouth: Mucous membranes are moist.     Pharynx: Oropharynx is clear. No oropharyngeal exudate or posterior oropharyngeal erythema.     Comments: There is moderate edema of the lips.  Eyes:     Extraocular Movements: Extraocular movements intact.     Pupils: Pupils are equal, round, and reactive to light.  Cardiovascular:     Rate and Rhythm: Normal rate and regular rhythm.     Heart sounds: No murmur heard. Pulmonary:     Effort: No respiratory distress.  Breath sounds: Normal breath sounds.  Musculoskeletal:     Cervical back: Normal range of motion and neck supple.  Neurological:     Mental Status: Robert [name] is alert and oriented to person, place, and time.      Assessment & Plan:   Robert [name] was seen today for allergic reaction.  Diagnoses and all orders for this visit:  Angioedema, initial encounter  Allergic reaction, initial encounter -     betamethasone acetate-betamethasone sodium phosphate (CELESTONE) injection 6 mg -     diphenhydrAMINE (BENADRYL) injection 25 mg      I am having Robert [name] maintain his acetaminophen, ibuprofen, Multiple Vitamins-Minerals (ONE-A-DAY MENS 50+ ADVANTAGE PO), fluticasone, HYDROcodone-acetaminophen, atorvastatin, and levothyroxine. We administered betamethasone acetate-betamethasone sodium phosphate  and diphenhydrAMINE.  Allergies as of 11/22/2020   No Known Allergies      Medication List        Accurate as of November 22, 2020  9:18 PM. If you have any questions, ask your nurse or doctor.          acetaminophen 325 MG tablet Commonly known as: TYLENOL Take 650 mg by mouth every 6 (six) hours as needed for moderate pain.   atorvastatin 20 MG tablet Commonly known as: LIPITOR Take 1 tablet (20 mg total) by mouth daily at 4 PM.   fluticasone 50 MCG/ACT nasal spray Commonly known as: FLONASE Place 2 sprays into both nostrils daily. What changed:  when to take this reasons to take this   HYDROcodone-acetaminophen 7.5-325 MG tablet Commonly known as: NORCO Take 1 tablet by mouth every 4 (four) hours as needed.   ibuprofen 200 MG tablet Commonly known as: ADVIL Take 200 mg by mouth every 6 (six) hours as needed for headache or mild pain.   levothyroxine 137 MCG tablet Commonly known as: SYNTHROID Take 1 tablet (137 mcg total) by mouth daily before breakfast.   ONE-A-DAY MENS 50+ ADVANTAGE PO Take 1 tablet by mouth daily.         Follow-up: Return if symptoms worsen or fail to improve.  Mechele Claude, M.D.

## 2020-11-23 ENCOUNTER — Ambulatory Visit: Payer: Medicare HMO | Admitting: Family Medicine

## 2020-11-25 ENCOUNTER — Ambulatory Visit (INDEPENDENT_AMBULATORY_CARE_PROVIDER_SITE_OTHER): Payer: Medicare HMO | Admitting: Family Medicine

## 2020-11-25 ENCOUNTER — Other Ambulatory Visit: Payer: Self-pay

## 2020-11-25 ENCOUNTER — Encounter: Payer: Self-pay | Admitting: Family Medicine

## 2020-11-25 VITALS — BP 102/59 | HR 73 | Temp 98.2°F | Ht 70.0 in | Wt 191.1 lb

## 2020-11-25 DIAGNOSIS — K130 Diseases of lips: Secondary | ICD-10-CM | POA: Diagnosis not present

## 2020-11-25 DIAGNOSIS — R220 Localized swelling, mass and lump, head: Secondary | ICD-10-CM

## 2020-11-25 MED ORDER — KETOROLAC TROMETHAMINE 60 MG/2ML IM SOLN
60.0000 mg | Freq: Once | INTRAMUSCULAR | Status: AC
  Administered 2020-11-25: 60 mg via INTRAMUSCULAR

## 2020-11-25 MED ORDER — VALACYCLOVIR HCL 1 G PO TABS: 1000.0000 mg | ORAL_TABLET | Freq: Two times a day (BID) | ORAL | 0 refills | Status: AC

## 2020-11-25 MED ORDER — NYSTATIN 100000 UNIT/ML MT SUSP: 5.0000 mL | Freq: Four times a day (QID) | OROMUCOSAL | 0 refills | Status: DC

## 2020-11-25 NOTE — Progress Notes (Signed)
Acute Office Visit  Subjective:    Patient ID: Robert [name], male    DOB: [birth year], 65 y.o.   MRN: [id number]  Chief Complaint  Patient presents with   swollen lips    HPI Patient is in today for swelling of his lower lip x4 days. He was seen in the office when it started. He was given a steroid shot and benadryl for an allergic reaction. He has not had any new medications or foods. He is not on an ACE or ARB. He reports that the swelling has continued. He also has sores on this lip. It is very painful and it is painful to eat. He denies a history of HSV. He denies trouble swallowing, sore throat, sore in his mouth, or mouth pain.   Past Medical History:  Diagnosis Date   Thyroid disease     Past Surgical History:  Procedure Laterality Date   COLONOSCOPY  1980's   in [facility]   HARDWARE REMOVAL Left 08/10/2020   Procedure: HARDWARE REMOVAL;  Surgeon: Shona Needles, MD;  Location: [place];  Service: Orthopedics;  Laterality: Left;   HERNIA REPAIR  05/3297   Umbilical   KNEE ARTHROSCOPY Left 08/10/2020   Procedure: ARTHROSCOPY KNEE WITH CHONDROPLASTY; PARTIAL MEDIAL MENISCECTOMY LYSIS OF ADHESIONS AND MANIPULATION;  Surgeon: Hiram Gash, MD;  Location: [place];  Service: Orthopedics;  Laterality: Left;   NO PAST SURGERIES     ORIF PATELLA Left 12/11/2019   Procedure: OPEN REDUCTION INTERNAL (ORIF) FIXATION PATELLA;  Surgeon: Shona Needles, MD;  Location: [place];  Service: Orthopedics;  Laterality: Left;    Family History  Problem Relation Age of Onset   Heart Problems Mother    Suicidality Father    Thyroid disease Neg Hx    Colon cancer Neg Hx     Social History   Socioeconomic History   Marital status: Single    Spouse name: Not on file   Number of children: 0   Years of education: Not on file   Highest education level: Not on file  Occupational History   Occupation: administer  Tobacco Use   Smoking status: Former    Packs/day: 0.25    Types: Cigarettes     Start date: 12/08/2000    Quit date: 12/09/2010    Years since quitting: 9.9   Smokeless tobacco: Never  Vaping Use   Vaping Use: Never used  Substance and Sexual Activity   Alcohol use: Never   Drug use: Never   Sexual activity: Not Currently  Other Topics Concern   Not on file  Social History Narrative   ** Merged History Encounter **   Lives alone; 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   Brother passed away 04-03-2020 in a house fire - has nieces and nephews nearby   Social Determinants of Health   Financial Resource Strain: Low Risk    Difficulty of Paying Living Expenses: Not hard at all  Food Insecurity: No Food Insecurity   Worried About Charity fundraiser in the Last Year: Never true   Arboriculturist in the Last Year: Never true  Transportation Needs: No Transportation Needs   Lack of Transportation (Medical): No   Lack of Transportation (Non-Medical): No  Physical Activity: Sufficiently Active   Days of Exercise per Week: 4 days   Minutes of Exercise per Session: 60 min  Stress: No Stress Concern Present   Feeling of Stress : Only a little  Social Connections: Moderately Integrated   Frequency of Communication with Friends and Family: More than three times a week   Frequency of Social Gatherings with Friends and Family: Twice a week   Attends Religious Services: More than 4 times per year   Active Member of [employer] or Organizations: Yes   Attends Music therapist: More than 4 times per year   Marital Status: Never married  Human resources officer Violence: Not At Risk   Fear of Current or Ex-Partner: No   Emotionally Abused: No   Physically Abused: No   Sexually Abused: No    Outpatient Medications Prior to Visit  Medication Sig Dispense Refill   acetaminophen (TYLENOL) 325 MG tablet Take 650 mg by mouth every 6 (six) hours as needed for moderate pain.     atorvastatin (LIPITOR) 20 MG tablet Take 1 tablet (20 mg total) by mouth daily at 4 PM. 90  tablet 3   fluticasone (FLONASE) 50 MCG/ACT nasal spray Place 2 sprays into both nostrils daily. (Patient taking differently: Place 2 sprays into both nostrils daily as needed for allergies or rhinitis.) 16 g 6   HYDROcodone-acetaminophen (NORCO) 7.5-325 MG tablet Take 1 tablet by mouth every 4 (four) hours as needed. 42 tablet 0   ibuprofen (ADVIL) 200 MG tablet Take 200 mg by mouth every 6 (six) hours as needed for headache or mild pain.     levothyroxine (SYNTHROID) 137 MCG tablet Take 1 tablet (137 mcg total) by mouth daily before breakfast. 90 tablet 1   Multiple Vitamins-Minerals (ONE-A-DAY MENS 50+ ADVANTAGE PO) Take 1 tablet by mouth daily.     No facility-administered medications prior to visit.    No Known Allergies  Review of Systems As per HPI.     Objective:    Physical Exam Vitals and nursing note reviewed.  Constitutional:      General: He is not in acute distress.    Appearance: He is not ill-appearing, toxic-appearing or diaphoretic.  HENT:     Mouth/Throat:     Lips: Lesions present.     Mouth: Mucous membranes are moist.     Tongue: No lesions.     Pharynx: Oropharynx is clear. No pharyngeal swelling, posterior oropharyngeal erythema or uvula swelling.     Tonsils: No tonsillar exudate or tonsillar abscesses.     Comments: Moderate swelling to lower lip. Multiple lesion present on lower lip consistent with HSV. Aphthous ulcer present to buccal mucosa.  Pulmonary:     Effort: Pulmonary effort is normal. No respiratory distress.     Breath sounds: Normal breath sounds. No stridor. No wheezing, rhonchi or rales.  Neurological:     Mental Status: He is alert.    BP (!) 102/59   Pulse 73   Temp 98.2 F (36.8 C) (Temporal)   Ht _0  (1.778 m)   Wt 191 lb 2 oz (86.7 kg)   BMI 27.42 kg/m  Wt Readings from Last 3 Encounters:  11/25/20 191 lb 2 oz (86.7 kg)  11/22/20 194 lb (88 kg)  11/14/20 202 lb (91.6 kg)    Health Maintenance Due  Topic Date Due    Pneumonia Vaccine 70+ Years old (2 - PPSV23 if available, else PCV20) 05/07/2020    There are no preventive care reminders to display for this patient.   Lab Results  Component Value Date   TSH 7.560 (H) 11/14/2020   Lab Results  Component Value Date   WBC 4.3 10/19/2020   HGB 13.8  10/19/2020   HCT 40.6 10/19/2020   MCV 93 10/19/2020   PLT 169 10/19/2020   Lab Results  Component Value Date   NA 139 10/19/2020   K 3.6 10/19/2020   CO2 22 10/19/2020   GLUCOSE 79 10/19/2020   BUN 8 10/19/2020   CREATININE 1.05 10/19/2020   BILITOT 0.4 10/19/2020   ALKPHOS 83 10/19/2020   AST 30 10/19/2020   ALT 13 10/19/2020   PROT 7.8 10/19/2020   ALBUMIN 4.6 10/19/2020   CALCIUM 8.9 10/19/2020   ANIONGAP 10 12/10/2019   EGFR 78 10/19/2020   Lab Results  Component Value Date   CHOL 171 10/19/2020   Lab Results  Component Value Date   HDL 59 10/19/2020   Lab Results  Component Value Date   LDLCALC 97 10/19/2020   Lab Results  Component Value Date   TRIG 78 10/19/2020   Lab Results  Component Value Date   CHOLHDL 2.9 10/19/2020   No results found for: HGBA1C     Assessment & Plan:   Robert [name] was seen today for swollen lips.  Diagnoses and all orders for this visit:  Lip swelling Sore of lower lip Swelling of lower lip with lesion consistent with HSV. Aphthous ulcers also present. Valtrex and magic mouthwash ordered. Toradol today in office.  -     ketorolac (TORADOL) injection 60 mg -     valACYclovir (VALTREX) 1000 MG tablet; Take 1 tablet (1,000 mg total) by mouth 2 (two) times daily for 10 days. -     magic mouthwash (nystatin, lidocaine, diphenhydrAMINE, alum & mag hydroxide) suspension; Swish and swallow 5 mLs 4 (four) times daily.  Return to office for new or worsening symptoms, or if symptoms persist.   The patient indicates understanding of these issues and agrees with the plan.   Gwenlyn Perking, FNP

## 2020-11-25 NOTE — Patient Instructions (Signed)
Cold Sore ?A cold sore, also called a fever blister, is a small, fluid-filled sore that forms inside the mouth or on the lips, gums, nose, chin, or cheeks. Cold sores can spread to other parts of the body, such as the eyes or fingers. In some people who have other medical conditions, cold sores can spread to multiple other body sites, including the genitals. ?Cold sores can spread from person to person (are contagious) until the sores crust over completely. Most cold sores go away within 2 weeks. ?What are the causes? ?Cold sores are caused by an infection from a common type of herpes simplex virus (HSV-1). HSV-1 is closely related to the HSV-2virus, which is the virus that causes genital herpes, but these viruses are not the same. Once a person is infected with HSV-1, the virus remains permanently in the body. ?HSV-1 is spread from person to person through close contact, such as through kissing, touching the affected area, or sharing personal items such as lip balm, razors, a drinking glass, or eating utensils. ?What increases the risk? ?You are more likely to develop this condition if you: ?Are tired, stressed, or sick. ?Are menstruating. ?Are pregnant. ?Take certain medicines. ?Are exposed to cold weather or too much sun. ?What are the signs or symptoms? ?Symptoms of a cold sore outbreak go through different stages. These are the stages of a cold sore: ?Tingling, itching, or burning is felt 1-2 days before the outbreak. ?Fluid-filled blisters appear on the lips, inside the mouth, on the nose, or on the cheeks. ?The blisters start to ooze clear fluid. ?The blisters dry up, and a yellow crust appears in their place. ?The crust falls off. ?In some cases, other symptoms can develop during a cold sore outbreak. These can include: ?Fever. ?Sore throat. ?Headache. ?Muscle aches. ?Swollen neck glands. ?How is this diagnosed? ?This condition is diagnosed based on your medical history and a physical exam. Your health care  provider may do a blood test or may swab some fluid from your sore and then examine the swab in the lab. ?How is this treated? ?There is no cure for cold sores or HSV-1. There is also no vaccine for HSV-1. Most cold sores go away on their own without treatment within 2 weeks. Medicines cannot make the infection go away, but your health care provider may prescribe medicines to: ?Help relieve some of the pain associated with the sores. ?Work to stop the virus from multiplying. ?Shorten healing time. ?Medicines may be in the form of creams, gels, pills, or a shot. ?Follow these instructions at home: ?Medicines ?Take or apply over-the-counter and prescription medicines only as told by your health care provider. ?Use a cotton-tip swab to apply creams or gels to your sores. ?Ask your health care provider if you can take lysine supplements. Research has found that lysine may help heal the cold sore faster and prevent outbreaks. ?Sore care ? ?Do not touch the sores or pick the scabs. ?Wash your hands often. Do not touch your eyes without washing your hands first. ?Keep the sores clean and dry. ?If directed, apply ice to the sores: ?Put ice in a plastic bag. ?Place a towel between your skin and the bag. ?Leave the ice on for 20 minutes, 2-3 times a day. ?Eating and drinking ?Eat a soft, bland diet. Avoid eating hot, cold, or salty foods. ?Use a straw if it hurts to drink out of a glass. ?Eat foods that are rich in lysine, such as meat, fish, and dairy   products. ?Avoid sugary foods, chocolates, nuts, and grains. These foods are rich in a nutrient called arginine, which can cause the virus to multiply. ?Lifestyle ?Do not kiss, have oral sex, or share personal items until your sores heal. ?Stress, poor sleep, and being out in the sun can trigger outbreaks. Make sure you: ?Do activities that help you relax, such as deep breathing exercises or meditation. ?Get enough sleep. ?Apply sunscreen on your lips before you go out in the  sun. ?Contact a health care provider if: ?You have symptoms for more than 2 weeks. ?You have pus coming from the sores. ?You have redness that is spreading. ?You have pain or irritation in your eye. ?You get sores on your genitals. ?Your sores do not heal within 2 weeks. ?You have frequent cold sore outbreaks. ?Get help right away if you have: ?A fever and your symptoms suddenly get worse. ?A headache and confusion. ?Fatigue or loss of appetite. ?A stiff neck or sensitivity to light. ?Summary ?A cold sore, also called a fever blister, is a small, fluid-filled sore that forms inside the mouth or on the lips, gums, nose, chin, or cheeks. ?Most cold sores go away on their own without treatment within 2 weeks. Your health care provider may prescribe medicines to help relieve some of the pain, work to stop the virus from multiplying, and shorten healing time. ?Wash your hands often. Do not touch your eyes without washing your hands first. ?Do not kiss, have oral sex, or share personal items until your sores heal. ?Contact a health care provider if your sores do not heal within 2 weeks. ?This information is not intended to replace advice given to you by your health care provider. Make sure you discuss any questions you have with your health care provider. ?Document Revised: 07/14/2020 Document Reviewed: 06/10/2017 ?Elsevier Patient Education ? 2022 Elsevier Inc. ? ?

## 2020-12-01 ENCOUNTER — Emergency Department (HOSPITAL_COMMUNITY): Payer: Medicare HMO

## 2020-12-01 ENCOUNTER — Emergency Department (HOSPITAL_COMMUNITY)
Admission: EM | Admit: 2020-12-01 | Discharge: 2020-12-03 | Disposition: A | Discharge status: Home / Self Care | Payer: Medicare HMO | Attending: Emergency Medicine | Admitting: Emergency Medicine

## 2020-12-01 ENCOUNTER — Encounter (HOSPITAL_COMMUNITY): Payer: Self-pay

## 2020-12-01 ENCOUNTER — Other Ambulatory Visit: Payer: Self-pay

## 2020-12-01 DIAGNOSIS — Z79899 Other long term (current) drug therapy: Secondary | ICD-10-CM | POA: Diagnosis not present

## 2020-12-01 DIAGNOSIS — D649 Anemia, unspecified: Secondary | ICD-10-CM

## 2020-12-01 DIAGNOSIS — F1999 Other psychoactive substance use, unspecified with unspecified psychoactive substance-induced disorder: Secondary | ICD-10-CM | POA: Diagnosis present

## 2020-12-01 DIAGNOSIS — Z87891 Personal history of nicotine dependence: Secondary | ICD-10-CM | POA: Insufficient documentation

## 2020-12-01 DIAGNOSIS — F1410 Cocaine abuse, uncomplicated: Secondary | ICD-10-CM | POA: Insufficient documentation

## 2020-12-01 DIAGNOSIS — R69 Illness, unspecified: Secondary | ICD-10-CM | POA: Diagnosis not present

## 2020-12-01 DIAGNOSIS — R0902 Hypoxemia: Secondary | ICD-10-CM | POA: Diagnosis not present

## 2020-12-01 DIAGNOSIS — E039 Hypothyroidism, unspecified: Secondary | ICD-10-CM | POA: Diagnosis not present

## 2020-12-01 DIAGNOSIS — F19951 Other psychoactive substance use, unspecified with psychoactive substance-induced psychotic disorder with hallucinations: Secondary | ICD-10-CM | POA: Diagnosis not present

## 2020-12-01 DIAGNOSIS — G9340 Encephalopathy, unspecified: Secondary | ICD-10-CM | POA: Insufficient documentation

## 2020-12-01 DIAGNOSIS — Z20822 Contact with and (suspected) exposure to covid-19: Secondary | ICD-10-CM | POA: Diagnosis not present

## 2020-12-01 DIAGNOSIS — Z743 Need for continuous supervision: Secondary | ICD-10-CM | POA: Diagnosis not present

## 2020-12-01 DIAGNOSIS — R29818 Other symptoms and signs involving the nervous system: Secondary | ICD-10-CM | POA: Diagnosis not present

## 2020-12-01 DIAGNOSIS — E876 Hypokalemia: Secondary | ICD-10-CM

## 2020-12-01 DIAGNOSIS — R4182 Altered mental status, unspecified: Secondary | ICD-10-CM | POA: Diagnosis not present

## 2020-12-01 DIAGNOSIS — F14120 Cocaine abuse with intoxication, uncomplicated: Secondary | ICD-10-CM | POA: Diagnosis not present

## 2020-12-01 DIAGNOSIS — R9431 Abnormal electrocardiogram [ECG] [EKG]: Secondary | ICD-10-CM | POA: Diagnosis not present

## 2020-12-01 DIAGNOSIS — F29 Unspecified psychosis not due to a substance or known physiological condition: Secondary | ICD-10-CM | POA: Diagnosis not present

## 2020-12-01 DIAGNOSIS — R404 Transient alteration of awareness: Secondary | ICD-10-CM | POA: Diagnosis not present

## 2020-12-01 DIAGNOSIS — F19931 Other psychoactive substance use, unspecified with withdrawal delirium: Secondary | ICD-10-CM | POA: Diagnosis not present

## 2020-12-01 LAB — URINALYSIS, ROUTINE W REFLEX MICROSCOPIC
Bilirubin Urine: NEGATIVE
Glucose, UA: NEGATIVE mg/dL
Hgb urine dipstick: NEGATIVE
Ketones, ur: 20 mg/dL — AB
Leukocytes,Ua: NEGATIVE
Nitrite: NEGATIVE
Protein, ur: 100 mg/dL — AB
Specific Gravity, Urine: 1.026 (ref 1.005–1.030)
pH: 5 (ref 5.0–8.0)

## 2020-12-01 LAB — COMPREHENSIVE METABOLIC PANEL
ALT: 43 U/L (ref 0–44)
AST: 73 U/L — ABNORMAL HIGH (ref 15–41)
Albumin: 3.8 g/dL (ref 3.5–5.0)
Alkaline Phosphatase: 52 U/L (ref 38–126)
Anion gap: 12 (ref 5–15)
BUN: 16 mg/dL (ref 8–23)
CO2: 22 mmol/L (ref 22–32)
Calcium: 8.5 mg/dL — ABNORMAL LOW (ref 8.9–10.3)
Chloride: 105 mmol/L (ref 98–111)
Creatinine, Ser: 1.27 mg/dL — ABNORMAL HIGH (ref 0.61–1.24)
GFR, Estimated: >60 mL/min (ref >60)
Glucose, Bld: 95 mg/dL (ref 70–99)
Potassium: 3 mmol/L — ABNORMAL LOW (ref 3.5–5.1)
Sodium: 139 mmol/L (ref 135–145)
Total Bilirubin: 1 mg/dL (ref 0.3–1.2)
Total Protein: 7.4 g/dL (ref 6.5–8.1)

## 2020-12-01 LAB — ETHANOL: Alcohol, Ethyl (B): <10 mg/dL (ref <10)

## 2020-12-01 LAB — CBC WITH DIFFERENTIAL/PLATELET
Abs Immature Granulocytes: 0.04 10*3/uL (ref 0.00–0.07)
Basophils Absolute: 0 10*3/uL (ref 0.0–0.1)
Basophils Relative: 0 %
Eosinophils Absolute: 0 10*3/uL (ref 0.0–0.5)
Eosinophils Relative: 0 %
HCT: 35 % — ABNORMAL LOW (ref 39.0–52.0)
Hemoglobin: 12.4 g/dL — ABNORMAL LOW (ref 13.0–17.0)
Immature Granulocytes: 0 %
Lymphocytes Relative: 8 %
Lymphs Abs: 0.7 10*3/uL (ref 0.7–4.0)
MCH: 33.2 pg (ref 26.0–34.0)
MCHC: 35.4 g/dL (ref 30.0–36.0)
MCV: 93.6 fL (ref 80.0–100.0)
Monocytes Absolute: 0.6 10*3/uL (ref 0.1–1.0)
Monocytes Relative: 6 %
Neutro Abs: 7.9 10*3/uL — ABNORMAL HIGH (ref 1.7–7.7)
Neutrophils Relative %: 86 %
Platelets: 202 10*3/uL (ref 150–400)
RBC: 3.74 MIL/uL — ABNORMAL LOW (ref 4.22–5.81)
RDW: 13.2 % (ref 11.5–15.5)
WBC: 9.2 10*3/uL (ref 4.0–10.5)
nRBC: 0 % (ref 0.0–0.2)

## 2020-12-01 LAB — RAPID URINE DRUG SCREEN, HOSP PERFORMED
Amphetamines: NOT DETECTED
Barbiturates: NOT DETECTED
Benzodiazepines: POSITIVE — AB
Cocaine: POSITIVE — AB
Opiates: NOT DETECTED
Tetrahydrocannabinol: NOT DETECTED

## 2020-12-01 LAB — CBG MONITORING, ED: Glucose-Capillary: 90 mg/dL (ref 70–99)

## 2020-12-01 LAB — TSH: TSH: 12.607 u[IU]/mL — ABNORMAL HIGH (ref 0.350–4.500)

## 2020-12-01 MED ORDER — SODIUM CHLORIDE 0.9 % IV SOLN: INTRAVENOUS | Status: DC

## 2020-12-01 MED ORDER — LORAZEPAM 2 MG/ML IJ SOLN
2.0000 mg | Freq: Once | INTRAMUSCULAR | Status: AC
  Administered 2020-12-01: 2 mg via INTRAVENOUS
  Filled 2020-12-01: qty 1

## 2020-12-01 NOTE — ED Triage Notes (Signed)
Pt bib ems for AMS.  Neighbor called ems due to pt acting strange.  Upon ems arrival pt was delirious and hitting at ems.  States that he was hallucinating.   Hollering "the baby is dead" and speaking about Jesus. Ems give haldol 5mg  and 15 min later versed 5mg  both IM.  Bgl 117.  Vss.  Pt calm and this time and asleep.

## 2020-12-01 NOTE — ED Notes (Signed)
Patient transported to CT 

## 2020-12-01 NOTE — ED Provider Notes (Signed)
[facility] EMERGENCY DEPARTMENT Provider Note   CSN: [id number] Arrival date & time: 12/01/20  1826     History Chief Complaint  Patient presents with   Altered Mental Status    Robert [name] is a 65 y.o. male.   Altered Mental Status  This patient is a 64 year old male, according to the medical history he has a history of hypothyroidism, hyperlipidemia, currently taking medications including Lipitor, Synthroid, no other significant medications.  The history is quite limited as the patient was at home when neighbors called 911 because he was acting erratically.  Evidently he was delirious, striking EMS, hallucinating "the baby is dead" and speaking about hyperreligious things.  He was given Haldol 5 mg and then Versed 5 mg And finally he was able to calm down.  At this time the patient is sedated and not answering questions.  He is maintaining his airway.  Level 5 caveat applies  Past Medical History:  Diagnosis Date   Thyroid disease     Patient Active Problem List   Diagnosis Date Noted   Abscess 10/31/2020   Hyperlipidemia 10/19/2020   Subacute frontal sinusitis 04/27/2020   Left patella fracture 12/10/2019   Overweight (BMI 25.0-29.9) 09/20/2017   Hypothyroidism following radioiodine therapy 03/11/2015   Urinary frequency 12/29/2014   Thoracic aorta atherosclerosis (HCC) 04/13/2014    Past Surgical History:  Procedure Laterality Date   COLONOSCOPY  1980's   in [facility]   HARDWARE REMOVAL Left 08/10/2020   Procedure: HARDWARE REMOVAL;  Surgeon: Roby Lofts, MD;  Location: [facility];  Service: Orthopedics;  Laterality: Left;   HERNIA REPAIR  03/2014   Umbilical   KNEE ARTHROSCOPY Left 08/10/2020   Procedure: ARTHROSCOPY KNEE WITH CHONDROPLASTY; PARTIAL MEDIAL MENISCECTOMY LYSIS OF ADHESIONS AND MANIPULATION;  Surgeon: Bjorn Pippin, MD;  Location: [facility];  Service: Orthopedics;  Laterality: Left;   NO PAST SURGERIES     ORIF PATELLA Left 12/11/2019   Procedure:  OPEN REDUCTION INTERNAL (ORIF) FIXATION PATELLA;  Surgeon: Roby Lofts, MD;  Location: [facility];  Service: Orthopedics;  Laterality: Left;       Family History  Problem Relation Age of Onset   Heart Problems Mother    Suicidality Father    Thyroid disease Neg Hx    Colon cancer Neg Hx     Social History   Tobacco Use   Smoking status: Former    Packs/day: 0.25    Types: Cigarettes    Start date: 12/08/2000    Quit date: 12/09/2010    Years since quitting: 9.9   Smokeless tobacco: Never  Vaping Use   Vaping Use: Never used  Substance Use Topics   Alcohol use: Never   Drug use: Never    Home Medications Prior to Admission medications   Medication Sig Start Date End Date Taking? Authorizing Provider  acetaminophen (TYLENOL) 325 MG tablet Take 650 mg by mouth every 6 (six) hours as needed for moderate pain.    [provider]  atorvastatin (LIPITOR) 20 MG tablet Take 1 tablet (20 mg total) by mouth daily at 4 PM. 10/19/20   Dettinger, Elige Radon, MD  fluticasone (FLONASE) 50 MCG/ACT nasal spray Place 2 sprays into both nostrils daily. Patient taking differently: Place 2 sprays into both nostrils daily as needed for allergies or rhinitis. 04/27/20   Daryll Drown, NP  HYDROcodone-acetaminophen (NORCO) 7.5-325 MG tablet Take 1 tablet by mouth every 4 (four) hours as needed. 08/10/20   Haddix, Gillie Manners, MD  ibuprofen (ADVIL) 200 MG tablet Take 200 mg by mouth every 6 (six) hours as needed for headache or mild pain.    [provider]  levothyroxine (SYNTHROID) 137 MCG tablet Take 1 tablet (137 mcg total) by mouth daily before breakfast. 11/17/20   Dettinger, Elige Radon, MD  magic mouthwash (nystatin, lidocaine, diphenhydrAMINE, alum & mag hydroxide) suspension Swish and swallow 5 mLs 4 (four) times daily. 11/25/20   Gabriel Earing, FNP  Multiple Vitamins-Minerals (ONE-A-DAY MENS 50+ ADVANTAGE PO) Take 1 tablet by mouth daily.    [provider]  valACYclovir  (VALTREX) 1000 MG tablet Take 1 tablet (1,000 mg total) by mouth 2 (two) times daily for 10 days. 11/25/20 12/05/20  Gabriel Earing, FNP  diphenhydrAMINE (BENADRYL) 25 MG tablet Take 25-50 mg by mouth daily as needed. For allergic reaction  04/22/12  [provider]  fexofenadine (ALLEGRA) 180 MG tablet 1 po qAM for hives and itching 02/24/12 04/22/12  Ivery Quale, PA-C    Allergies    Patient has no known allergies.  Review of Systems   Review of Systems  Unable to perform ROS: Mental status change   Physical Exam Updated Vital Signs BP 101/71   Pulse 62   Temp (!) 96.6 F (35.9 C) (Rectal)   Resp 12   Ht 1.778 m (5\' 10" )   Wt 86 kg   SpO2 98%   BMI 27.20 kg/m   Physical Exam Vitals and nursing note reviewed.  Constitutional:      Appearance: He is well-developed.     Comments: Somnolent to obtunded, sleepy  HENT:     Head: Normocephalic and atraumatic.     Mouth/Throat:     Mouth: Mucous membranes are dry.     Pharynx: No oropharyngeal exudate.  Eyes:     General: No scleral icterus.       Right eye: No discharge.        Left eye: No discharge.     Conjunctiva/sclera: Conjunctivae normal.     Pupils: Pupils are equal, round, and reactive to light.  Neck:     Thyroid: No thyromegaly.     Vascular: No JVD.  Cardiovascular:     Rate and Rhythm: Normal rate and regular rhythm.     Heart sounds: Normal heart sounds. No murmur heard.   No friction rub. No gallop.  Pulmonary:     Effort: Pulmonary effort is normal. No respiratory distress.     Breath sounds: Normal breath sounds. No wheezing or rales.  Abdominal:     General: Bowel sounds are normal. There is no distension.     Palpations: Abdomen is soft. There is no mass.     Tenderness: There is no abdominal tenderness.  Musculoskeletal:        General: No swelling, tenderness, deformity or signs of injury. Normal range of motion.     Cervical back: Normal range of motion and neck supple.     Right  lower leg: No edema.     Left lower leg: No edema.  Lymphadenopathy:     Cervical: No cervical adenopathy.  Skin:    General: Skin is warm and dry.     Findings: No erythema or rash.  Neurological:     Comments: Somnolent to obtunded, responds to painful stimuli, protecting his airway  Psychiatric:        Behavior: Behavior normal.    ED Results / Procedures / Treatments   Labs (all labs ordered are listed,  but only abnormal results are displayed) Labs Reviewed  COMPREHENSIVE METABOLIC PANEL - Abnormal; Notable for the following components:      Result Value   Potassium 3.0 (*)    Creatinine, Ser 1.27 (*)    Calcium 8.5 (*)    AST 73 (*)    All other components within normal limits  CBC WITH DIFFERENTIAL/PLATELET - Abnormal; Notable for the following components:   RBC 3.74 (*)    Hemoglobin 12.4 (*)    HCT 35.0 (*)    Neutro Abs 7.9 (*)    All other components within normal limits  URINALYSIS, ROUTINE W REFLEX MICROSCOPIC - Abnormal; Notable for the following components:   APPearance HAZY (*)    Ketones, ur 20 (*)    Protein, ur 100 (*)    Bacteria, UA RARE (*)    All other components within normal limits  RAPID URINE DRUG SCREEN, HOSP PERFORMED - Abnormal; Notable for the following components:   Cocaine POSITIVE (*)    Benzodiazepines POSITIVE (*)    All other components within normal limits  TSH - Abnormal; Notable for the following components:   TSH 12.607 (*)    All other components within normal limits  ETHANOL  CBG MONITORING, ED    EKG EKG Interpretation  Date/Time:  Thursday December 01 2020 19:09:38 EST Ventricular Rate:  71 PR Interval:  240 QRS Duration: 102 QT Interval:  507 QTC Calculation: 552 R Axis:   -4 Text Interpretation: Sinus rhythm Prolonged PR interval Probable anteroseptal infarct, old Prolonged QT interval since last tracing no significant change Confirmed by Eber Hong ([id number]) on 12/01/2020 7:42:46 PM  Radiology CT HEAD WO  CONTRAST  Result Date: 12/01/2020 CLINICAL DATA:  Acute neuro deficit.  Altered mental status. EXAM: CT HEAD WITHOUT CONTRAST TECHNIQUE: Contiguous axial images were obtained from the base of the skull through the vertex without intravenous contrast. COMPARISON:  CT head 12/10/2019 FINDINGS: Brain: No evidence of acute infarction, hemorrhage, hydrocephalus, extra-axial collection or mass lesion/mass effect. Vascular: Negative for hyperdense vessel Skull: Negative Sinuses/Orbits: Retention cyst in the sphenoid sinus. Remaining sinuses clear. Negative orbit Other: None IMPRESSION: Negative CT of the brain. Electronically Signed   By: Marlan Palau M.D.   On: 12/01/2020 19:39    Procedures Procedures   Medications Ordered in ED Medications  0.9 %  sodium chloride infusion ( Intravenous New Bag/Given 12/01/20 2002)  LORazepam (ATIVAN) injection 2 mg (2 mg Intravenous Given 12/01/20 2313)    ED Course  I have reviewed the triage vital signs and the nursing notes.  Pertinent labs & imaging results that were available during my care of the patient were reviewed by me and considered in my medical decision making (see chart for details).    MDM Rules/Calculators/A&P                           It is unclear exactly what is going on with the patient, he has a prolonged QT at 544, otherwise the patient is in no distress and seems to be quite sedated.  The cause of the patient's severe agitation is unclear, will try to discuss with anybody on his demographic list that may know anything.  He will need EKG, labs, temperature, this may be drug related, this may be primarily psychiatric, it is unclear at this time  I discussed the care with the patient's niece who is his next of kin on his demographics list, she was  unaware that he was here, he has never had anything like this, he does have a distant history of drug abuse dating back at least 10 years.  I reviewed the medical record and see no signs of  recent illnesses, psychiatric illnesses, drug-related illnesses etc.  On repeat exam at 11:15 PM the patient is still agitated, he has come out of his sedation and is now talking about somebody who tricked him, he states that he took cocaine, somebody was trying to trick him and now he feels like a hypocrite as he used to be a Education officer, environmental.  He becomes extremely agitated during this time and starts to pull off leads and shake, his mental status is still clouded, he will need more Ativan for sedation as he is still significantly agitated.  At change of shift, care signed out to Dr. Preston Fleeting to follow-up patient's improvement and disposition accordingly.  At this time I still think this is related to his cocaine use  Final Clinical Impression(s) / ED Diagnoses Final diagnoses:  Cocaine abuse (HCC)  Acute encephalopathy    Rx / DC Orders ED Discharge Orders     None        Eber Hong, MD 12/01/20 2314

## 2020-12-01 NOTE — ED Notes (Addendum)
Pt is waking up. Pt very agitated and anxious. This RN notified edp.

## 2020-12-01 NOTE — ED Notes (Addendum)
This RN talked with pt niece. Pt recently had knee surgery and has been stressed out. Daughter states that he was "ok and doing good" a week ago. Pt lives at home by themself. Niece stated that pt has a hx of cocaine use but has been clean for almost 10 years.

## 2020-12-02 ENCOUNTER — Encounter (HOSPITAL_COMMUNITY): Payer: Self-pay | Admitting: Emergency Medicine

## 2020-12-02 LAB — RESP PANEL BY RT-PCR (FLU A&B, COVID) ARPGX2
Influenza A by PCR: NEGATIVE
Influenza B by PCR: NEGATIVE
SARS Coronavirus 2 by RT PCR: NEGATIVE

## 2020-12-02 MED ORDER — LEVOTHYROXINE SODIUM 137 MCG PO TABS
137.0000 ug | ORAL_TABLET | Freq: Every day | ORAL | Status: DC
  Administered 2020-12-02 – 2020-12-03 (×2): 137 ug via ORAL
  Filled 2020-12-02 (×2): qty 1

## 2020-12-02 MED ORDER — POTASSIUM CHLORIDE CRYS ER 20 MEQ PO TBCR
40.0000 meq | EXTENDED_RELEASE_TABLET | Freq: Once | ORAL | Status: AC
  Administered 2020-12-02: 40 meq via ORAL
  Filled 2020-12-02: qty 2

## 2020-12-02 MED ORDER — LORAZEPAM 1 MG PO TABS
2.0000 mg | ORAL_TABLET | Freq: Once | ORAL | Status: AC
  Administered 2020-12-02: 2 mg via ORAL
  Filled 2020-12-02: qty 2

## 2020-12-02 MED ORDER — ACETAMINOPHEN 325 MG PO TABS: 650.0000 mg | ORAL_TABLET | ORAL | Status: DC | PRN

## 2020-12-02 MED ORDER — VALACYCLOVIR HCL 500 MG PO TABS
1000.0000 mg | ORAL_TABLET | Freq: Two times a day (BID) | ORAL | Status: DC
  Administered 2020-12-02 – 2020-12-03 (×3): 1000 mg via ORAL
  Filled 2020-12-02 (×3): qty 2

## 2020-12-02 MED ORDER — VALACYCLOVIR HCL 500 MG PO TABS: 1000.0000 mg | ORAL_TABLET | Freq: Two times a day (BID) | ORAL | Status: DC

## 2020-12-02 MED ORDER — OLANZAPINE 5 MG PO TBDP: 5.0000 mg | ORAL_TABLET | Freq: Every day | ORAL | Status: DC

## 2020-12-02 MED ORDER — ONDANSETRON HCL 4 MG PO TABS: 4.0000 mg | ORAL_TABLET | Freq: Three times a day (TID) | ORAL | Status: DC | PRN

## 2020-12-02 MED ORDER — ALUM & MAG HYDROXIDE-SIMETH 200-200-20 MG/5ML PO SUSP: 30.0000 mL | Freq: Four times a day (QID) | ORAL | Status: DC | PRN

## 2020-12-02 NOTE — BH Assessment (Addendum)
Per pt's RN, pt gave permission to contact his niece, Sara Chu.  Attempted to call niece, Sara Chu at [phone], for safety planning per NP's request Georga Bora Leevy-Johson). No answer. Left HIPPA complaint message for a callback.

## 2020-12-02 NOTE — ED Notes (Signed)
Pt is displaying erratic behavior and random movements/gestures at this time. A&O x 4. Pt requested to watch the sports channel

## 2020-12-02 NOTE — BH Assessment (Signed)
Received callback from pt's niece, Sara Chu [phone]) and discussed discharge planning and support. Niece stated that to her knowledge pt has no personal hx of psych diagnosis or treatment. She added that there is a hx in their family of both Bipolar d/o and Schizophrenia. Niece stated that pt's last brother died in 03-29-2022and he was greatly upset by his passing and by legal and other arrangements that have had to be made with his niece since that time. Niece stated that pt has a hx of SA with crack cocaine being his drug of choice. 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 Renato Gails of a church. She stated she believes he may have relapsed while being influenced by this group. Niece stated that she has been in touch in recent months with members of pt's church and they have discussed the possible need to support him in substance use or mental health treatment. Niece stated she lives in [place], [place] and is not local. Niece plans to contact supportive friends/family locally to support pt upon his discharge. Per discussion with Laurey Morale, pt is recommended to continue observation overnight with re-assessment tomorrow.   Zyanna Leisinger T. Jimmye Norman, MS, [facility], [facility] Triage Specialist [facility]

## 2020-12-02 NOTE — ED Notes (Signed)
Gave update to niece via phone call.

## 2020-12-02 NOTE — BH Assessment (Addendum)
Comprehensive Clinical Assessment (CCA) Note  12/02/2020 Robert [name] [id number]  DISPOSITION: Per Gus Height, pt is recommended to continue observation in he ED overnight with re-assessment tomorrow. Collateral contact with his niece made. RN Karlene Lineman was advised and she was asked to advise the EDP.   The patient demonstrates the following risk factors for suicide: Chronic risk factors for suicide include: N/A. Acute risk factors for suicide include: N/A. Protective factors for this patient include: positive social support, coping skills, and hope for the future. Considering these factors, the overall suicide risk at this point appears to be low. Patient is appropriate for outpatient follow up.  [place] ED from 12/01/2020 in [place] Admission (Discharged) from 08/10/2020 in [place] No Risk No Risk      Pt was brought to the ED via EMS after a call from neighbors that pt was "acting strange." Per chart, pt had AMS, was acting erratically, making hyper-religious statements, delusional and appeared to be hallucinating. In addition, pt was agitated and tried to hit EMS staff. Pt had to be given medication to calm him. Per chart, pt was trying to eat his bed sheet. No prior hx of psych issues or treatment. Pt stated that "2 girls" had given him some cocaine and he "tripped out." Pt tested positive for cocaine and benzos in the ED. No hx of psych issues, diagnosis or treatment or substance use. Pt denied any hx of either.   Chief Complaint:  Chief Complaint  Patient presents with   Altered Mental Status   Visit Diagnosis:  Substance-induced psychosis    CCA Screening, Triage and Referral (STR)  Patient Reported Information How did you hear about [place]? Other (Comment)  What Is the Reason for Your Visit/Call Today? Pt was brought to the ED via EMS after a call from neighbors that pt was "acting strange."  Per chart, pt had AMS, was acting erratically, making hyper-religious statements, delusional and appeared to be hallucinating. In addition, pt was agitated and tried to hit EMS staff. Pt had to be given medication to calm him. Per chart, pt was trying to eat his bed sheet. No prior hx of psych issues or treatment. Pt stated that "2 girls" had given him some cocaine and he "tripped out." Pt tested positive for cocaine and benzos in the ED.  How Long Has This Been Causing You Problems? <Week  What Do You Feel Would Help You the Most Today? No data recorded  Have You Recently Had Any Thoughts About Hurting Yourself? No  Are You Planning to Commit Suicide/Harm Yourself At This time? No   Have you Recently Had Thoughts About [place]? No  Are You Planning to Harm Someone at This Time? No  Explanation: No data recorded  Have You Used Any Alcohol or Drugs in the Past 24 Hours? Yes  How Long Ago Did You Use Drugs or Alcohol? No data recorded What Did You Use and How Much? cocaine use   Do You Currently Have a Therapist/Psychiatrist? No  Name of Therapist/Psychiatrist: No data recorded  Have You Been Recently Discharged From Any Office Practice or Programs? No  Explanation of Discharge From Practice/Program: No data recorded    CCA Screening Triage Referral Assessment Type of Contact: Tele-Assessment  Telemedicine Service Delivery:   Is this Initial or Reassessment? Initial Assessment  Date Telepsych consult ordered in [facility]:  12/02/20  Time Telepsych consult ordered in [facility]:  [place]  Location of Assessment: [facility]  Provider Location: [facility] Assessment Services   Collateral Involvement: none   Does Patient Have a Stage manager Guardian? No data recorded Name and Contact of Legal Guardian: No data recorded If Minor and Not Living with Parent(s), Who has Custody? No data recorded Is CPS involved or ever been involved? -- (uta)  Is APS involved or ever been  involved? -- Pincus Badder)   Patient Determined To Be At Risk for Harm To Self or Others Based on Review of Patient Reported Information or Presenting Complaint? No  Method: No data recorded Availability of Means: No data recorded Intent: No data recorded Notification Required: No data recorded Additional Information for Danger to Others Potential: No data recorded Additional Comments for Danger to Others Potential: No data recorded Are There Guns or Other Weapons in Your Home? No data recorded Types of Guns/Weapons: No data recorded Are These Weapons Safely Secured?                            No data recorded Who Could Verify You Are Able To Have These Secured: No data recorded Do You Have any Outstanding Charges, Pending Court Dates, Parole/Probation? No data recorded Contacted To Inform of Risk of Harm To Self or Others: No data recorded   Does Patient Present under Involuntary Commitment? No  IVC Papers Initial File Date: No data recorded  [place] of Residence: [place]   Patient Currently Receiving the Following Services: No data recorded  Determination of Need: Routine (7 days)   Options For Referral: No data recorded    CCA Biopsychosocial Patient Reported Schizophrenia/Schizoaffective Diagnosis in Past: No   Strengths: uta   Mental Health Symptoms Depression:   None   Duration of Depressive symptoms:    Mania:   None   Anxiety:    None   Psychosis:   None   Duration of Psychotic symptoms:    Trauma:   None   Obsessions:   None   Compulsions:   None   Inattention:   None   Hyperactivity/Impulsivity:   None   Oppositional/Defiant Behaviors:   N/A   Emotional Irregularity:   None   Other Mood/Personality Symptoms:   uta    Mental Status Exam Appearance and self-care  Stature:   Average   Weight:   Average weight   Clothing:   Casual   Grooming:   Normal   Cosmetic use:   None   Posture/gait:   Normal   Motor activity:    Not Remarkable   Sensorium  Attention:   Normal   Concentration:   Normal   Orientation:   Person; Place; Situation; Time   Recall/memory:   Normal   Affect and Mood  Affect:   Full Range; Appropriate   Mood:   Euthymic   Relating  Eye contact:   Normal   Facial expression:   Responsive   Attitude toward examiner:   Cooperative   Thought and Language  Speech flow:  Clear and Coherent; Normal   Thought content:   Appropriate to Mood and Circumstances   Preoccupation:   None   Hallucinations:   None   Organization:  No data recorded  [employer] of Knowledge:   Average   Intelligence:   Average   Abstraction:   Normal   Judgement:   Good   Reality Testing:   Adequate   Insight:   Good  Decision Making:   Normal   Social Functioning  Social Maturity:   Responsible   Social Judgement:   Normal   Stress  Stressors:   -- (None reported)   Coping Ability:   Normal   Skill Deficits:   None   Supports:   Church; Friends/Service system     Religion: Religion/Spirituality Are You A Religious Person?: Yes  Leisure/Recreation:    Exercise/Diet: Exercise/Diet Do You Exercise?: Yes How Many Times a Week Do You Exercise?: 1-3 times a week Have You Gained or Lost A Significant Amount of Weight in the Past Six Months?: No Do You Follow a Special Diet?: No Do You Have Any Trouble Sleeping?: No   CCA Employment/Education Employment/Work Situation: Employment / Work Situation Employment Situation: Retired Has Patient ever Been in Equities trader?: No  Education: Education Last Grade Completed: 12 (4 years of college) Did Theme park manager?: Yes What Type of College Degree Do you Have?: degree Did You Have An Individualized Education Program (IIEP): No Did You Have Any Difficulty At School?: No   CCA Family/Childhood History Family and Relationship History: Family history Marital status: Single Does  patient have children?: No  Childhood History:  Childhood History By whom was/is the patient raised?: Both parents Did patient suffer any verbal/emotional/physical/sexual abuse as a child?: No Did patient suffer from severe childhood neglect?: No Has patient ever been sexually abused/assaulted/raped as an adolescent or adult?: No Was the patient ever a victim of a crime or a disaster?: No Witnessed domestic violence?: No Has patient been affected by domestic violence as an adult?: No  Child/Adolescent Assessment:     CCA Substance Use Alcohol/Drug Use: Alcohol / Drug Use Pain Medications: see MAR Prescriptions: see MAR Over the Counter: see MAR History of alcohol / drug use?: No history of alcohol / drug abuse                         ASAM's:  Six Dimensions of Multidimensional Assessment  Dimension 1:  Acute Intoxication and/or Withdrawal Potential:      Dimension 2:  Biomedical Conditions and Complications:      Dimension 3:  Emotional, Behavioral, or Cognitive Conditions and Complications:     Dimension 4:  Readiness to Change:     Dimension 5:  Relapse, Continued use, or Continued Problem Potential:     Dimension 6:  Recovery/Living Environment:     ASAM Severity Score:    ASAM Recommended Level of Treatment:     Substance use Disorder (SUD)    Recommendations for Services/Supports/Treatments:    Discharge Disposition:    DSM5 Diagnoses: Patient Active Problem List   Diagnosis Date Noted   Abscess 10/31/2020   Hyperlipidemia 10/19/2020   Subacute frontal sinusitis 04/27/2020   Left patella fracture 12/10/2019   Overweight (BMI 25.0-29.9) 09/20/2017   Hypothyroidism following radioiodine therapy 03/11/2015   Urinary frequency 12/29/2014   Thoracic aorta atherosclerosis (HCC) 04/13/2014     Referrals to Alternative Service(s): Referred to Alternative Service(s):   Place:   Date:   Time:    Referred to Alternative Service(s):   Place:   Date:    Time:    Referred to Alternative Service(s):   Place:   Date:   Time:    Referred to Alternative Service(s):   Place:   Date:   Time:     Carolanne Grumbling, Counselor  Corrie Dandy T. Jimmye Norman, MS, [facility], [facility] Triage Specialist [facility]

## 2020-12-02 NOTE — ED Provider Notes (Signed)
Care assumed from Dr. Hyacinth Meeker, patient with altered mental status after cocaine use.  He has required sedation because of agitation, has not been able to have a rational discussion.  He will need to be reevaluated once he is able to have a rational conversation.  4:19 AM Patient awake and cooperative, but still having disordered thought processes.  He focuses on how to people checked him into swallowing cocaine.  He is clearly improved, but not at baseline and not able to be discharged.  He will be observed in the ED.  5:43 AM Mental status has not changed and he is starting to get mildly agitated.  We will request consultation with TTS.   Dione Booze, MD 12/02/20 [phone]

## 2020-12-02 NOTE — ED Notes (Signed)
Pt nephew called regarding an update on pt and that pt is ok. Nephew is not on pt contact list so could not discuss care at this time. Nephew knows about pt hx with cocaine and stated that he knew the pt recently started hanging around 2 girls who may or may not got pt back into drugs. Nephew is concerned of pt health status and well-being.

## 2020-12-02 NOTE — ED Notes (Signed)
Gave pt warm blankets and sprite to drink. Applied oral lubrication to lips upon pt request.

## 2020-12-02 NOTE — ED Notes (Signed)
Pt's niece updated as to patient's status.

## 2020-12-02 NOTE — ED Notes (Signed)
Pt was attempting to eat his bed sheet. Pt had sheet fibers in his mouth. Pt is praying loudly and casting demons. Edp made aware. Will continue to monitor

## 2020-12-02 NOTE — ED Notes (Signed)
Pt requested scissors to cut the sheet to get out what was inside to eat it. "I need what's inside to save my life" Reassured the pt that there was nothing in the sheet and that staff was here to keep him safe. Unable to continue IV fluids at this time. IV is not flushing. Pt is jittery and constantly moving, patting his body, or itching.  Will continue to monitor pt

## 2020-12-03 DIAGNOSIS — F14120 Cocaine abuse with intoxication, uncomplicated: Secondary | ICD-10-CM

## 2020-12-03 DIAGNOSIS — F1999 Other psychoactive substance use, unspecified with unspecified psychoactive substance-induced disorder: Secondary | ICD-10-CM | POA: Diagnosis present

## 2020-12-03 DIAGNOSIS — F1410 Cocaine abuse, uncomplicated: Secondary | ICD-10-CM | POA: Diagnosis present

## 2020-12-03 DIAGNOSIS — R69 Illness, unspecified: Secondary | ICD-10-CM | POA: Diagnosis not present

## 2020-12-03 NOTE — Discharge Instructions (Signed)
Substance Abuse Resources   [facility] Recovery Services - [facility] Address: [address] [place], [place] [address] Phone: [phone]  [facility] - Admissions are currently completed Monday through Friday at 8am; both appointments and walk-ins are accepted.  Any individual that is a [facility] resident may present for a substance abuse screening and assessment for admission.  A person may be referred by numerous sources or self-refer.   Potential clients will be screened for medical necessity and appropriateness for the program.  Clients must meet criteria for high-intensity residential treatment services.  If clinically appropriate, a client will continue with the comprehensive clinical assessment and intake process, as well as enrollment in the [facility].  Address: [address] [place], [place] [address] Admin Hours: Mon-Fri 8AM to [facility] [place] [facility] Center Hours: 24/7 Phone: [phone] Fax: [phone]  [facility] (Detox) Facility Based Crisis:  These are 3 locations for services: Please call before arrival:    [facility] Recovery Facility Based Crisis [facility])  Address: [address] [place], [place] [address] Phone: [phone]  [facility] Recovery Facility Based Crisis [facility]) Address: [address] [place], [place] [address] Phone#: [phone]  [facility] Recovery Facility Based Crisis [facility]) Address: [address] [place], [place] [address] Phone#: [phone]   [facility] ([facility]): (offers outpatient therapy and intensive outpatient substance abuse therapy).  [address] [place], [place] [address] Phone: [phone]   [facility])  Address: [address] [place], [place], [place] [address] Phone: [phone]   [facility] Address: [address] [place], [place] [address] Phone: [phone]  - a combination of group and individual sessions to  meet the participants needs. This allows participants to engage in treatment and remain involved in their home and work life. - Transitional housing places program participants in a supportive living environment while they complete a treatment program and work to secure independent housing. - The Substance Abuse Intensive Outpatient Treatment Program at [employer] consists of structured group sessions and individual sessions that are designed to teach participants early recovery and relapse prevention skills. -[facility] works with the [employer] to provide a housing and treatment program for homeless veterans.   Residential Company secretary, [employer].   Address: [address] [place], [place] [address] Phone#: [phone]   : Referrals to RTSA facilities can be made by [employer] and [facility].  Referrals are also accepted from physicians, private providers, hospital emergency rooms, family members, or any person who has knowledge of someone in the need of our services.  The [facility] 24-Hour Call Center: [phone] Behavioral Health Crisis Line: [phone]

## 2020-12-03 NOTE — ED Notes (Signed)
Report given to tele sitter Maisie Fus. Tele sitter is now monitoring.

## 2020-12-03 NOTE — ED Notes (Addendum)
Called pt's pastor per pt request for ride home.

## 2020-12-03 NOTE — ED Notes (Signed)
Patient's ride is coming from [facility] to get him, approx 45 minutes-1 hour.

## 2020-12-03 NOTE — ED Provider Notes (Signed)
For continued erratic behavior suspected due to his recent cocaine use.  This morning the patient is now awake and alert oriented x3 able to have a coherent normal discussion and contract for safety.  He was seen by social workers and psychiatry team and cleared for continued outpatient care.  Advised return if he has any other concerns, otherwise discharged home in stable condition.   Cheryll Cockayne, MD 12/03/20 [phone]

## 2020-12-03 NOTE — Progress Notes (Signed)
CSW provided the following resources for the patient to utilize for support to address needs:   Substance Abuse Resources   [facility] Recovery Services - [facility] Address: [address] [place], [place], [place] [address] Phone: [phone]  [facility] - Admissions are currently completed Monday through Friday at 8am; both appointments and walk-ins are accepted.  Any individual that is a [facility] resident may present for a substance abuse screening and assessment for admission.  A person may be referred by numerous sources or self-refer.   Potential clients will be screened for medical necessity and appropriateness for the program.  Clients must meet criteria for high-intensity residential treatment services.  If clinically appropriate, a client will continue with the comprehensive clinical assessment and intake process, as well as enrollment in the [facility].  Address: [address] [place], [place] [address] Admin Hours: Mon-Fri 8AM to [facility] Center Hours: 24/7 Phone: [phone] Fax: [phone]  [facility] (Detox) Facility Based Crisis:  These are 3 locations for services: Please call before arrival:    [facility] Recovery Facility Based Crisis [facility])  Address: [address] [place], [place] [address] Phone: [phone]  [facility] Recovery Facility Based Crisis [facility]) Address: [address] [place], [place] [address] Phone#: [phone]  [facility] Recovery Facility Based Crisis [facility]) Address: [address] [place], [place] [address] Phone#: [phone]   [facility] ([facility]): (offers outpatient therapy and intensive outpatient substance abuse therapy).  [address] [place], [place] [address] Phone: [phone]   [facility])  Address: [address] [place], [place], [place] [address] Phone: [phone]   [facility] Address: [address],  [place], [place] [address] Phone: [phone]  - a combination of group and individual sessions to meet the participants needs. This allows participants to engage in treatment and remain involved in their home and work life. - Transitional housing places program participants in a supportive living environment while they complete a treatment program and work to secure independent housing. - The Substance Abuse Intensive Outpatient Treatment Program at [employer] consists of structured group sessions and individual sessions that are designed to teach participants early recovery and relapse prevention skills. -[facility] works with the [employer] to provide a housing and treatment program for homeless veterans.   Residential Company secretary, [employer].   Address: [address] [place], [place] [address] Phone#: [phone]   : Referrals to RTSA facilities can be made by [facility] and [facility].  Referrals are also accepted from physicians, private providers, hospital emergency rooms, family members, or any person who has knowledge of someone in the need of our services.  The [facility] 24-Hour Call Center: [phone] Behavioral Health Crisis Line: [phone]  Crissie Reese, MSW, LCSW-A, [place] Phone: [phone] Disposition/TOC

## 2020-12-03 NOTE — Consult Note (Signed)
Telepsych Consultation   Reason for Consult:  Psych Consult Referring Physician:  Dione Booze, MD Location of Patient: [facility] Location of Provider: Behavioral Health TTS Department  Patient Identification: Robert [name] MRN:  [id number] Principal Diagnosis: Substance-induced disorder [facility]) Diagnosis:  Principal Problem:   Substance-induced disorder (HCC) Active Problems:   Cocaine abuse (HCC)   Total Time spent with patient: 20 minutes  Subjective:   Robert [name] is a 65 y.o. male patient admitted with substance induced disorder.  Patient presents alert and oriented to person, place, and situation.  "I think I took a overdose of cocaine. I was clean for 9 years and I used 3 times. I don't need no sponsor, no NA, none of that. 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 Endorses only substance abuse. Denies any history or active suicidal or homicidal ideations, auditory or visual hallucinations. He appears calm and cooperative, sober, and does not appear to be responding to any external/internal stimuli at this time. He provided verbal permission to speak to his niece for further safety planning.   Collateral: Robert [name] [phone] (no answer)   HPI:  Robert [name] is a 65 year old male with past history of substance abuse who presented to [facility] for substance induced disorder where patient's neighbors called police due to him acting bizarre in his front yard. 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 BAL <10; UDS+ benzodiazepines, cocaine. Patient given Versed 5 mg IM x2 by EMS due to behavior. PDMP reviewed, Oxycodone-Acetaminophen 5-325 40 pills filled 11/08/20.   Past Psychiatric History: substance abuse  Risk to Self:  pt denies Risk to Others:  pt denies Prior Inpatient Therapy:  pt denies Prior Outpatient Therapy:  pt denies  Past Medical  History:  Past Medical History:  Diagnosis Date   Thyroid disease     Past Surgical History:  Procedure Laterality Date   COLONOSCOPY  1980's   in [facility]   HARDWARE REMOVAL Left 08/10/2020   Procedure: HARDWARE REMOVAL;  Surgeon: Roby Lofts, MD;  Location: [facility] OR;  Service: Orthopedics;  Laterality: Left;   HERNIA REPAIR  03/2014   Umbilical   KNEE ARTHROSCOPY Left 08/10/2020   Procedure: ARTHROSCOPY KNEE WITH CHONDROPLASTY; PARTIAL MEDIAL MENISCECTOMY LYSIS OF ADHESIONS AND MANIPULATION;  Surgeon: Bjorn Pippin, MD;  Location: [facility] OR;  Service: Orthopedics;  Laterality: Left;   NO PAST SURGERIES     ORIF PATELLA Left 12/11/2019   Procedure: OPEN REDUCTION INTERNAL (ORIF) FIXATION PATELLA;  Surgeon: Roby Lofts, MD;  Location: [facility] OR;  Service: Orthopedics;  Laterality: Left;   Family History:  Family History  Problem Relation Age of Onset   Heart Problems Mother    Suicidality Father    Thyroid disease Neg Hx    Colon cancer Neg Hx    Family Psychiatric  History: not noted Social History:  Social History   Substance and Sexual Activity  Alcohol Use Never     Social History   Substance and Sexual Activity  Drug Use Not Currently   Types: Cocaine   Comment: previous cocaine user. clean for 9 years    Social History   Socioeconomic History   Marital status: Single    Spouse name: Not on file   Number of children: 0   Years of education: Not on file   Highest education level: Not  on file  Occupational History   Occupation: administer  Tobacco Use   Smoking status: Former    Packs/day: 0.25    Types: Cigarettes    Start date: 12/08/2000    Quit date: 12/09/2010    Years since quitting: 9.9   Smokeless tobacco: Never  Vaping Use   Vaping Use: Never used  Substance and Sexual Activity   Alcohol use: Never   Drug use: Not Currently    Types: Cocaine    Comment: previous cocaine user. clean for 9 years   Sexual activity: Not Currently  Other Topics  Concern   Not on file  Social History Narrative   ** Merged History Encounter **   Lives alone; 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   Brother passed away March 31, 2020 in a house fire - has nieces and nephews nearby   Social Determinants of Health   Financial Resource Strain: Low Risk    Difficulty of Paying Living Expenses: Not hard at all  Food Insecurity: No Food Insecurity   Worried About Programme researcher, broadcasting/film/video in the Last Year: Never true   Barista in the Last Year: Never true  Transportation Needs: No Transportation Needs   Lack of Transportation (Medical): No   Lack of Transportation (Non-Medical): No  Physical Activity: Sufficiently Active   Days of Exercise per Week: 4 days   Minutes of Exercise per Session: 60 min  Stress: No Stress Concern Present   Feeling of Stress : Only a little  Social Connections: Moderately Integrated   Frequency of Communication with Friends and Family: More than three times a week   Frequency of Social Gatherings with Friends and Family: Twice a week   Attends Religious Services: More than 4 times per year   Active Member of [employer] or Organizations: Yes   Attends Engineer, structural: More than 4 times per year   Marital Status: Never married   Additional Social History:    Allergies:  No Known Allergies  Labs:  Results for orders placed or performed during the hospital encounter of 12/01/20 (from the past 48 hour(s))  Comprehensive metabolic panel     Status: Abnormal   Collection Time: 12/01/20  6:39 PM  Result Value Ref Range   Sodium 139 135 - 145 mmol/L   Potassium 3.0 (L) 3.5 - 5.1 mmol/L   Chloride 105 98 - 111 mmol/L   CO2 22 22 - 32 mmol/L   Glucose, Bld 95 70 - 99 mg/dL    Comment: Glucose reference range applies only to samples taken after fasting for at least 8 hours.   BUN 16 8 - 23 mg/dL   Creatinine, Ser 0.86 (H) 0.61 - 1.24 mg/dL   Calcium 8.5 (L) 8.9 - 10.3 mg/dL   Total Protein 7.4 6.5 - 8.1 g/dL    Albumin 3.8 3.5 - 5.0 g/dL   AST 73 (H) 15 - 41 U/L   ALT 43 0 - 44 U/L   Alkaline Phosphatase 52 38 - 126 U/L   Total Bilirubin 1.0 0.3 - 1.2 mg/dL   GFR, Estimated >57 >84 mL/min    Comment: (NOTE) Calculated using the CKD-EPI Creatinine Equation (2021)    Anion gap 12 5 - 15    Comment: Performed at [facility], [address], [place], [place] [address]  CBC WITH DIFFERENTIAL     Status: Abnormal   Collection Time: 12/01/20  6:39 PM  Result Value Ref Range   WBC  9.2 4.0 - 10.5 K/uL   RBC 3.74 (L) 4.22 - 5.81 MIL/uL   Hemoglobin 12.4 (L) 13.0 - 17.0 g/dL   HCT 40.9 (L) 81.1 - 91.4 %   MCV 93.6 80.0 - 100.0 fL   MCH 33.2 26.0 - 34.0 pg   MCHC 35.4 30.0 - 36.0 g/dL   RDW 78.2 95.6 - 21.3 %   Platelets 202 150 - 400 K/uL   nRBC 0.0 0.0 - 0.2 %   Neutrophils Relative % 86 %   Neutro Abs 7.9 (H) 1.7 - 7.7 K/uL   Lymphocytes Relative 8 %   Lymphs Abs 0.7 0.7 - 4.0 K/uL   Monocytes Relative 6 %   Monocytes Absolute 0.6 0.1 - 1.0 K/uL   Eosinophils Relative 0 %   Eosinophils Absolute 0.0 0.0 - 0.5 K/uL   Basophils Relative 0 %   Basophils Absolute 0.0 0.0 - 0.1 K/uL   Immature Granulocytes 0 %   Abs Immature Granulocytes 0.04 0.00 - 0.07 K/uL    Comment: Performed at [facility], [address] [place], [place] [address]  Ethanol     Status: None   Collection Time: 12/01/20  6:39 PM  Result Value Ref Range   Alcohol, Ethyl (B) <10 <10 mg/dL    Comment: Performed at [facility], [address], [place], [place] [address]  TSH     Status: Abnormal   Collection Time: 12/01/20  6:39 PM  Result Value Ref Range   TSH 12.607 (H) 0.350 - 4.500 uIU/mL    Comment: Performed by a 3rd Generation assay with a functional sensitivity of <=0.01 uIU/mL. Performed at [place] [facility], [address] [place], [place] [address]   CBG monitoring, ED     Status: None   Collection Time: 12/01/20  7:46 PM  Result Value Ref Range   Glucose-Capillary 90 70 - 99 mg/dL    Comment: Glucose reference  range applies only to samples taken after fasting for at least 8 hours.  Urinalysis, Routine w reflex microscopic Urine, In & Out Cath     Status: Abnormal   Collection Time: 12/01/20  8:00 PM  Result Value Ref Range   Color, Urine YELLOW YELLOW   APPearance HAZY (A) CLEAR   Specific Gravity, Urine 1.026 1.005 - 1.030   pH 5.0 5.0 - 8.0   Glucose, UA NEGATIVE NEGATIVE mg/dL   Hgb urine dipstick NEGATIVE NEGATIVE   Bilirubin Urine NEGATIVE NEGATIVE   Ketones, ur 20 (A) NEGATIVE mg/dL   Protein, ur 413 (A) NEGATIVE mg/dL   Nitrite NEGATIVE NEGATIVE   Leukocytes,Ua NEGATIVE NEGATIVE   RBC / HPF 0-5 0 - 5 RBC/hpf   WBC, UA 0-5 0 - 5 WBC/hpf   Bacteria, UA RARE (A) NONE SEEN   Squamous Epithelial / LPF 0-5 0 - 5   Mucus PRESENT    Hyaline Casts, UA PRESENT     Comment: Performed at [facility], [address], [place], [place] [address]  Rapid urine drug screen (hospital performed)     Status: Abnormal   Collection Time: 12/01/20  8:00 PM  Result Value Ref Range   Opiates NONE DETECTED NONE DETECTED   Cocaine POSITIVE (A) NONE DETECTED   Benzodiazepines POSITIVE (A) NONE DETECTED   Amphetamines NONE DETECTED NONE DETECTED   Tetrahydrocannabinol NONE DETECTED NONE DETECTED   Barbiturates NONE DETECTED NONE DETECTED    Comment: (NOTE) DRUG SCREEN FOR MEDICAL PURPOSES ONLY.  IF CONFIRMATION IS NEEDED FOR ANY PURPOSE, NOTIFY LAB WITHIN  5 DAYS.  LOWEST DETECTABLE LIMITS FOR URINE DRUG SCREEN Drug Class                     Cutoff (ng/mL) Amphetamine and metabolites    1000 Barbiturate and metabolites    200 Benzodiazepine                 200 Tricyclics and metabolites     300 Opiates and metabolites        300 Cocaine and metabolites        300 THC                            50 Performed at [facility], [address], [place], [place] [address]   Resp Panel by RT-PCR (Flu A&B, Covid) Nasopharyngeal Swab     Status: None   Collection Time: 12/02/20  5:42 AM   Specimen:  Nasopharyngeal Swab; Nasopharyngeal(NP) swabs in vial transport medium  Result Value Ref Range   SARS Coronavirus 2 by RT PCR NEGATIVE NEGATIVE    Comment: (NOTE) SARS-CoV-2 target nucleic acids are NOT DETECTED.  The SARS-CoV-2 RNA is generally detectable in upper respiratory specimens during the acute phase of infection. The lowest concentration of SARS-CoV-2 viral copies this assay can detect is 138 copies/mL. A negative result does not preclude SARS-Cov-2 infection and should not be used as the sole basis for treatment or other patient management decisions. A negative result may occur with  improper specimen collection/handling, submission of specimen other than nasopharyngeal swab, presence of viral mutation(s) within the areas targeted by this assay, and inadequate number of viral copies(<138 copies/mL). A negative result must be combined with clinical observations, patient history, and epidemiological information. The expected result is Negative.  Fact Sheet for Patients:  BloggerCourse.com  Fact Sheet for Healthcare Providers:  SeriousBroker.it  This test is no t yet approved or cleared by the [place] FDA and  has been authorized for detection and/or diagnosis of SARS-CoV-2 by FDA under an Emergency Use Authorization (EUA). This EUA will remain  in effect (meaning this test can be used) for the duration of the COVID-19 declaration under Section 564(b)(1) of the Act, 21 U.S.C.section 360bbb-3(b)(1), unless the authorization is terminated  or revoked sooner.       Influenza A by PCR NEGATIVE NEGATIVE   Influenza B by PCR NEGATIVE NEGATIVE    Comment: (NOTE) The Xpert Xpress SARS-CoV-2/FLU/RSV plus assay is intended as an aid in the diagnosis of influenza from Nasopharyngeal swab specimens and should not be used as a sole basis for treatment. Nasal washings and aspirates are unacceptable for Xpert Xpress  SARS-CoV-2/FLU/RSV testing.  Fact Sheet for Patients: BloggerCourse.com  Fact Sheet for Healthcare Providers: SeriousBroker.it  This test is not yet approved or cleared by the [place] FDA and has been authorized for detection and/or diagnosis of SARS-CoV-2 by FDA under an Emergency Use Authorization (EUA). This EUA will remain in effect (meaning this test can be used) for the duration of the COVID-19 declaration under Section 564(b)(1) of the Act, 21 U.S.C. section 360bbb-3(b)(1), unless the authorization is terminated or revoked.  Performed at [facility], [address] [place], [place] [address]     Medications:  Current Facility-Administered Medications  Medication Dose Route Frequency Provider Last Rate Last Admin   0.9 %  sodium chloride infusion   Intravenous Continuous Eber Hong, MD 125 mL/hr at 12/03/20 0215 New Bag at 12/03/20  0215   acetaminophen (TYLENOL) tablet 650 mg  650 mg Oral Q4H PRN Dione Booze, MD       alum & mag hydroxide-simeth (MAALOX/MYLANTA) 200-200-20 MG/5ML suspension 30 mL  30 mL Oral Q6H PRN Dione Booze, MD       levothyroxine (SYNTHROID) tablet 137 mcg  137 mcg Oral QAC breakfast Dione Booze, MD   137 mcg at 12/03/20 0710   ondansetron [facility]) tablet 4 mg  4 mg Oral Q8H PRN Dione Booze, MD       valACYclovir Ralph Dowdy) tablet 1,000 mg  1,000 mg Oral BID Sabas Sous, MD   1,000 mg at 12/02/20 2110   Current Outpatient Medications  Medication Sig Dispense Refill   acetaminophen (TYLENOL) 325 MG tablet Take 650 mg by mouth every 6 (six) hours as needed for moderate pain.     atorvastatin (LIPITOR) 20 MG tablet Take 1 tablet (20 mg total) by mouth daily at 4 PM. 90 tablet 3   fluticasone (FLONASE) 50 MCG/ACT nasal spray Place 2 sprays into both nostrils daily. (Patient taking differently: Place 2 sprays into both nostrils daily as needed for allergies or rhinitis.) 16 g 6    HYDROcodone-acetaminophen (NORCO) 7.5-325 MG tablet Take 1 tablet by mouth every 4 (four) hours as needed. 42 tablet 0   ibuprofen (ADVIL) 200 MG tablet Take 200 mg by mouth every 6 (six) hours as needed for headache or mild pain.     levothyroxine (SYNTHROID) 137 MCG tablet Take 1 tablet (137 mcg total) by mouth daily before breakfast. 90 tablet 1   lidocaine (XYLOCAINE) 2 % solution Use as directed 15 mLs in the mouth or throat every 4 (four) hours as needed for mouth pain.     magic mouthwash (nystatin, lidocaine, diphenhydrAMINE, alum & mag hydroxide) suspension Swish and swallow 5 mLs 4 (four) times daily. 180 mL 0   Multiple Vitamins-Minerals (ONE-A-DAY MENS 50+ ADVANTAGE PO) Take 1 tablet by mouth daily.     oxyCODONE-acetaminophen (PERCOCET/ROXICET) 5-325 MG tablet Take 1 tablet by mouth 4 (four) times daily as needed.     valACYclovir (VALTREX) 1000 MG tablet Take 1 tablet (1,000 mg total) by mouth 2 (two) times daily for 10 days. 20 tablet 0    Musculoskeletal: Strength & Muscle Tone:  unable to fully assess Gait & Station:  unable to fully assess Patient leans: N/A  Psychiatric Specialty Exam:  Presentation  General Appearance: Casual Eye Contact:Good Speech:Clear and Coherent Speech Volume:Normal Handedness:No data recorded  Mood and Affect  Mood:Euthymic Affect:Congruent  Thought Process  Thought Processes:Coherent; Goal Directed Descriptions of Associations:Intact Orientation:Partial Thought Content:WDL History of Schizophrenia/Schizoaffective disorder:No  Duration of Psychotic Symptoms:No data recorded Hallucinations:Hallucinations: None Ideas of Reference:None Suicidal Thoughts:Suicidal Thoughts: No Homicidal Thoughts:Homicidal Thoughts: No  Sensorium  Memory:Immediate Fair; Recent Fair; Remote Fair Judgment:Fair Insight:Present  Executive Functions  Concentration:Good Attention Span:Good Recall:Good Fund of  Knowledge:Good Language:Good  Psychomotor Activity  Psychomotor Activity:Psychomotor Activity: Normal  Assets  Assets:Communication Skills; Financial Resources/Insurance; Housing; Resilience; Social Support; Physical Health; Vocational/Educational  Sleep  Sleep:Sleep: Good   Physical Exam: Physical Exam Vitals and nursing note reviewed.  Constitutional:      Appearance: Normal appearance. He is normal weight.  HENT:     Head: Normocephalic.     Nose: Nose normal.     Mouth/Throat:     Mouth: Mucous membranes are moist.     Pharynx: Oropharynx is clear.  Eyes:     Pupils: Pupils are equal, round, and reactive to light.  Cardiovascular:     Rate and Rhythm: Normal rate.     Pulses: Normal pulses.  Pulmonary:     Effort: Pulmonary effort is normal.  Musculoskeletal:        General: Normal range of motion.     Cervical back: Normal range of motion.  Skin:    General: Skin is warm and dry.  Neurological:     Mental Status: He is alert and oriented to person, place, and time. Mental status is at baseline.  Psychiatric:        Attention and Perception: Attention and perception normal.        Mood and Affect: Mood and affect normal.        Speech: Speech normal.        Behavior: Behavior normal. Behavior is cooperative.        Thought Content: Thought content normal. Thought content is not paranoid or delusional. Thought content does not include homicidal or suicidal ideation. Thought content does not include homicidal or suicidal plan.        Cognition and Memory: Cognition and memory normal.        Judgment: Judgment normal.   Review of Systems  Psychiatric/Behavioral:  Positive for substance abuse. Negative for depression, hallucinations and suicidal ideas.   All other systems reviewed and are negative. Blood pressure 130/90, pulse 61, temperature 98.8 F (37.1 C), temperature source Oral, resp. rate 14, height 5\' 10"  (1.778 m), weight 86 kg, SpO2 99 %. Body mass index  is 27.2 kg/m.  Treatment Plan Summary: Plan Discharge patient home with local outpatient substance abuse and mental resources for individual follow-up.   Disposition: No evidence of imminent risk to self or others at present.   Patient does not meet criteria for psychiatric inpatient admission. Supportive therapy provided about ongoing stressors. Discussed crisis plan, support from social network, calling 911, coming to the Emergency Department, and calling Suicide Hotline.  This service was provided via telemedicine using a 2-way, interactive audio and video technology.  Names of all persons participating in this telemedicine service and their role in this encounter. Name: Maxie Barb Role: PMHNP  Name: Nelly Rout Role: Attending MD  Name: Robert [name] Role: patient   Name:  Role:     Loletta Parish, NP 12/03/2020 9:04 AM

## 2020-12-03 NOTE — ED Notes (Signed)
Tele sitter at bedside. Tele sitter called and stated they would call back when able to take report. Pt is calm and cooperative at this time.

## 2020-12-03 NOTE — ED Notes (Signed)
Patient being discharges, tele monitoring discontinued.

## 2020-12-22 ENCOUNTER — Other Ambulatory Visit: Payer: Medicare HMO

## 2020-12-22 DIAGNOSIS — Z01812 Encounter for preprocedural laboratory examination: Secondary | ICD-10-CM | POA: Diagnosis not present

## 2020-12-22 DIAGNOSIS — T8484XA Pain due to internal orthopedic prosthetic devices, implants and grafts, initial encounter: Secondary | ICD-10-CM | POA: Diagnosis not present

## 2021-01-09 ENCOUNTER — Telehealth: Payer: Self-pay | Admitting: Family Medicine

## 2021-01-09 NOTE — Telephone Encounter (Signed)
Routed UA results to Dr. Jena Gauss

## 2021-04-06 DIAGNOSIS — H40023 Open angle with borderline findings, high risk, bilateral: Secondary | ICD-10-CM | POA: Diagnosis not present

## 2021-04-06 DIAGNOSIS — Z0100 Encounter for examination of eyes and vision without abnormal findings: Secondary | ICD-10-CM | POA: Diagnosis not present

## 2021-04-06 DIAGNOSIS — H25813 Combined forms of age-related cataract, bilateral: Secondary | ICD-10-CM | POA: Diagnosis not present

## 2021-04-06 DIAGNOSIS — H5200 Hypermetropia, unspecified eye: Secondary | ICD-10-CM | POA: Diagnosis not present

## 2021-04-19 ENCOUNTER — Encounter: Payer: Medicare HMO | Admitting: Family Medicine

## 2021-04-19 DIAGNOSIS — E890 Postprocedural hypothyroidism: Secondary | ICD-10-CM

## 2021-04-19 DIAGNOSIS — E782 Mixed hyperlipidemia: Secondary | ICD-10-CM

## 2021-04-26 ENCOUNTER — Encounter: Payer: Self-pay | Admitting: Family Medicine

## 2021-05-05 ENCOUNTER — Other Ambulatory Visit: Payer: Self-pay | Admitting: Family Medicine

## 2021-05-11 DIAGNOSIS — R051 Acute cough: Secondary | ICD-10-CM | POA: Diagnosis not present

## 2021-05-11 DIAGNOSIS — J0100 Acute maxillary sinusitis, unspecified: Secondary | ICD-10-CM | POA: Diagnosis not present

## 2021-06-02 ENCOUNTER — Encounter: Payer: Self-pay | Admitting: Family Medicine

## 2021-06-02 ENCOUNTER — Ambulatory Visit (INDEPENDENT_AMBULATORY_CARE_PROVIDER_SITE_OTHER): Payer: Medicare HMO | Admitting: Family Medicine

## 2021-06-02 VITALS — BP 127/79 | HR 70 | Temp 98.2°F | Resp 20 | Ht 70.0 in | Wt 201.0 lb

## 2021-06-02 DIAGNOSIS — M1711 Unilateral primary osteoarthritis, right knee: Secondary | ICD-10-CM | POA: Diagnosis not present

## 2021-06-02 DIAGNOSIS — E890 Postprocedural hypothyroidism: Secondary | ICD-10-CM | POA: Diagnosis not present

## 2021-06-02 DIAGNOSIS — E782 Mixed hyperlipidemia: Secondary | ICD-10-CM | POA: Diagnosis not present

## 2021-06-02 DIAGNOSIS — Z125 Encounter for screening for malignant neoplasm of prostate: Secondary | ICD-10-CM | POA: Diagnosis not present

## 2021-06-02 DIAGNOSIS — Z0001 Encounter for general adult medical examination with abnormal findings: Secondary | ICD-10-CM | POA: Diagnosis not present

## 2021-06-02 DIAGNOSIS — Z0000 Encounter for general adult medical examination without abnormal findings: Secondary | ICD-10-CM

## 2021-06-02 DIAGNOSIS — B372 Candidiasis of skin and nail: Secondary | ICD-10-CM | POA: Diagnosis not present

## 2021-06-02 MED ORDER — LEVOTHYROXINE SODIUM 137 MCG PO TABS: 137.0000 ug | ORAL_TABLET | Freq: Every day | ORAL | 3 refills | Status: DC

## 2021-06-02 MED ORDER — DICLOFENAC SODIUM 75 MG PO TBEC: 75.0000 mg | DELAYED_RELEASE_TABLET | Freq: Two times a day (BID) | ORAL | 3 refills | Status: DC

## 2021-06-02 MED ORDER — NYSTATIN 100000 UNIT/GM EX POWD: 1.0000 "application " | Freq: Three times a day (TID) | CUTANEOUS | 1 refills | Status: DC

## 2021-06-02 NOTE — Progress Notes (Signed)
? ?BP 127/79   Pulse 70   Temp 98.2 ?F (36.8 ?C) (Temporal)   Resp 20   Ht '5\' 10"'  (1.778 m)   Wt 201 lb (91.2 kg)   SpO2 94%   BMI 28.84 kg/m?   ? ?Subjective:  ? ?Patient ID: Robert [name], male    DOB: 02-09-1955, 66 y.o.   MRN: [id number] ? ?HPI: ?Robert [name] is a 66 y.o. male presenting on 06/02/2021 for Annual Exam, Hypothyroidism, and Hyperlipidemia ? ? ?HPI ?Adult well exam ?Patient denies any chest pain, shortness of breath, headaches or vision issues, abdominal complaints, diarrhea, nausea, vomiting, or joint issues.  ? ?Hypothyroidism recheck ?Patient is coming in for thyroid recheck today as well. They deny any issues with hair changes or heat or cold problems or diarrhea or constipation. They deny any chest pain or palpitations. They are currently on levothyroxine 137 micrograms  ? ?Hyperlipidemia ?Patient is coming in for recheck of his hyperlipidemia. The patient is currently taking atorvastatin. They deny any issues with myalgias or history of liver damage from it. They deny any focal numbness or weakness or chest pain.  ? ?Patient gets a recurrent rash in his groin sometimes with ingrown hair, he has used a cream for it before and it has helped, wants a refill of that cream. ? ?Relevant past medical, surgical, family and social history reviewed and updated as indicated. Interim medical history since our last visit reviewed. ?Allergies and medications reviewed and updated. ? ?Review of Systems  ?Constitutional:  Negative for chills and fever.  ?HENT:  Negative for ear pain and tinnitus.   ?Eyes:  Negative for pain and discharge.  ?Respiratory:  Negative for cough, shortness of breath and wheezing.   ?Cardiovascular:  Negative for chest pain, palpitations and leg swelling.  ?Gastrointestinal:  Negative for abdominal pain, blood in stool, constipation and diarrhea.  ?Genitourinary:  Negative for dysuria and hematuria.  ?Musculoskeletal:  Positive for arthralgias. Negative for back pain, gait  problem and myalgias.  ?Skin:  Positive for rash. Negative for color change.  ?Neurological:  Negative for dizziness, weakness and headaches.  ?Psychiatric/Behavioral:  Negative for suicidal ideas.   ?All other systems reviewed and are negative. ? ?Per HPI unless specifically indicated above ? ? ?Allergies as of 06/02/2021   ?No Known Allergies ?  ? ?  ?Medication List  ?  ? ?  ? Accurate as of Jun 02, 2021 11:19 AM. If you have any questions, ask your nurse or doctor.  ?  ?  ? ?  ? ?acetaminophen 325 MG tablet ?Commonly known as: TYLENOL ?Take 650 mg by mouth every 6 (six) hours as needed for moderate pain. ?  ?atorvastatin 20 MG tablet ?Commonly known as: LIPITOR ?Take 1 tablet (20 mg total) by mouth daily at 4 PM. ?  ?diclofenac 75 MG EC tablet ?Commonly known as: VOLTAREN ?Take 1 tablet (75 mg total) by mouth 2 (two) times daily. ?Started by: Worthy Rancher, MD ?  ?fluticasone 50 MCG/ACT nasal spray ?Commonly known as: FLONASE ?Place 2 sprays into both nostrils daily. ?What changed:  ?when to take this ?reasons to take this ?  ?HYDROcodone-acetaminophen 7.5-325 MG tablet ?Commonly known as: NORCO ?Take 1 tablet by mouth every 4 (four) hours as needed. ?  ?ibuprofen 200 MG tablet ?Commonly known as: ADVIL ?Take 200 mg by mouth every 6 (six) hours as needed for headache or mild pain. ?  ?levothyroxine 137 MCG tablet ?Commonly known as: SYNTHROID ?Take 1 tablet (137 mcg  total) by mouth daily before breakfast. ?  ?lidocaine 2 % solution ?Commonly known as: XYLOCAINE ?Use as directed 15 mLs in the mouth or throat every 4 (four) hours as needed for mouth pain. ?  ?magic mouthwash (nystatin, lidocaine, diphenhydrAMINE, alum & mag hydroxide) suspension ?Swish and swallow 5 mLs 4 (four) times daily. ?  ?nystatin powder ?Commonly known as: MYCOSTATIN/NYSTOP ?Apply 1 application. topically 3 (three) times daily. ?Started by: Worthy Rancher, MD ?  ?ONE-A-DAY MENS 50+ ADVANTAGE PO ?Take 1 tablet by mouth daily. ?   ?oxyCODONE-acetaminophen 5-325 MG tablet ?Commonly known as: PERCOCET/ROXICET ?Take 1 tablet by mouth 4 (four) times daily as needed. ?  ? ?  ? ? ? ?Objective:  ? ?BP 127/79   Pulse 70   Temp 98.2 ?F (36.8 ?C) (Temporal)   Resp 20   Ht '5\' 10"'  (1.778 m)   Wt 201 lb (91.2 kg)   SpO2 94%   BMI 28.84 kg/m?   ?Wt Readings from Last 3 Encounters:  ?06/02/21 201 lb (91.2 kg)  ?12/01/20 189 lb 9.5 oz (86 kg)  ?11/25/20 191 lb 2 oz (86.7 kg)  ?  ?Physical Exam ?Vitals and nursing note reviewed.  ?Constitutional:   ?   General: He is not in acute distress. ?   Appearance: He is well-developed. He is not diaphoretic.  ?Eyes:  ?   General: No scleral icterus. ?   Conjunctiva/sclera: Conjunctivae normal.  ?Neck:  ?   Thyroid: No thyromegaly.  ?Cardiovascular:  ?   Rate and Rhythm: Normal rate and regular rhythm.  ?   Heart sounds: Normal heart sounds. No murmur heard. ?Pulmonary:  ?   Effort: Pulmonary effort is normal. No respiratory distress.  ?   Breath sounds: Normal breath sounds. No wheezing.  ?Genitourinary: ?   Prostate: Normal.  ?   Rectum: Normal.  ?Musculoskeletal:     ?   General: No swelling. Normal range of motion.  ?   Cervical back: Neck supple.  ?Lymphadenopathy:  ?   Cervical: No cervical adenopathy.  ?Skin: ?   General: Skin is warm and dry.  ?   Findings: Rash (Small irritation on left scrotum) present.  ?Neurological:  ?   Mental Status: He is alert and oriented to person, place, and time.  ?   Coordination: Coordination normal.  ?Psychiatric:     ?   Behavior: Behavior normal.  ? ? ? ? ?Assessment & Plan:  ? ?Problem List Items Addressed This Visit   ? ?  ? Endocrine  ? Hypothyroidism following radioiodine therapy - Primary  ? Relevant Medications  ? levothyroxine (SYNTHROID) 137 MCG tablet  ? Other Relevant Orders  ? CBC with Differential/Platelet  ? Thyroid Panel With TSH  ?  ? Other  ? Hyperlipidemia  ? Relevant Orders  ? CMP14+EGFR  ? Lipid panel  ? ?Other Visit Diagnoses   ? ? Well adult exam       ? Prostate cancer screening      ? Relevant Orders  ? PSA, total and free  ? Primary osteoarthritis of right knee      ? Relevant Medications  ? diclofenac (VOLTAREN) 75 MG EC tablet  ? Yeast dermatitis      ? Relevant Medications  ? nystatin (MYCOSTATIN/NYSTOP) powder  ? ?  ?  ?Continue current medications we will try Voltaren to see if it helps more with his arthritis. ?Follow up plan: ?Return in about 6 months (around 12/03/2021), or if symptoms  worsen or fail to improve, for Thyroid recheck. ? ?Counseling provided for all of the vaccine components ?Orders Placed This Encounter  ?Procedures  ? CBC with Differential/Platelet  ? CMP14+EGFR  ? Lipid panel  ? Thyroid Panel With TSH  ? PSA, total and free  ? ? ?Caryl Pina, MD ?[place] ?06/02/2021, 11:19 AM ? ? ?  ?

## 2021-06-03 LAB — CBC WITH DIFFERENTIAL/PLATELET
Basophils Absolute: 0 10*3/uL (ref 0.0–0.2)
Basos: 0 %
EOS (ABSOLUTE): 0.1 10*3/uL (ref 0.0–0.4)
Eos: 1 %
Hematocrit: 36.1 % — ABNORMAL LOW (ref 37.5–51.0)
Hemoglobin: 12.6 g/dL — ABNORMAL LOW (ref 13.0–17.7)
Immature Grans (Abs): 0 10*3/uL (ref 0.0–0.1)
Immature Granulocytes: 0 %
Lymphocytes Absolute: 0.9 10*3/uL (ref 0.7–3.1)
Lymphs: 14 %
MCH: 32.6 pg (ref 26.6–33.0)
MCHC: 34.9 g/dL (ref 31.5–35.7)
MCV: 94 fL (ref 79–97)
Monocytes Absolute: 0.4 10*3/uL (ref 0.1–0.9)
Monocytes: 6 %
Neutrophils Absolute: 5.5 10*3/uL (ref 1.4–7.0)
Neutrophils: 79 %
Platelets: 184 10*3/uL (ref 150–450)
RBC: 3.86 x10E6/uL — ABNORMAL LOW (ref 4.14–5.80)
RDW: 13.3 % (ref 11.6–15.4)
WBC: 6.9 10*3/uL (ref 3.4–10.8)

## 2021-06-03 LAB — LIPID PANEL
Chol/HDL Ratio: 2.4 ratio (ref 0.0–5.0)
Cholesterol, Total: 149 mg/dL (ref 100–199)
HDL: 61 mg/dL (ref >39)
LDL Chol Calc (NIH): 77 mg/dL (ref 0–99)
Triglycerides: 50 mg/dL (ref 0–149)
VLDL Cholesterol Cal: 11 mg/dL (ref 5–40)

## 2021-06-03 LAB — THYROID PANEL WITH TSH
Free Thyroxine Index: 2.9 (ref 1.2–4.9)
T3 Uptake Ratio: 29 % (ref 24–39)
T4, Total: 10 ug/dL (ref 4.5–12.0)
TSH: 0.252 u[IU]/mL — ABNORMAL LOW (ref 0.450–4.500)

## 2021-06-03 LAB — CMP14+EGFR
ALT: 19 IU/L (ref 0–44)
AST: 24 IU/L (ref 0–40)
Albumin/Globulin Ratio: 1.6 (ref 1.2–2.2)
Albumin: 4.5 g/dL (ref 3.8–4.8)
Alkaline Phosphatase: 78 IU/L (ref 44–121)
BUN/Creatinine Ratio: 9 — ABNORMAL LOW (ref 10–24)
BUN: 8 mg/dL (ref 8–27)
Bilirubin Total: 0.5 mg/dL (ref 0.0–1.2)
CO2: 24 mmol/L (ref 20–29)
Calcium: 9 mg/dL (ref 8.6–10.2)
Chloride: 106 mmol/L (ref 96–106)
Creatinine, Ser: 0.87 mg/dL (ref 0.76–1.27)
Globulin, Total: 2.9 g/dL (ref 1.5–4.5)
Glucose: 91 mg/dL (ref 70–99)
Potassium: 4 mmol/L (ref 3.5–5.2)
Sodium: 144 mmol/L (ref 134–144)
Total Protein: 7.4 g/dL (ref 6.0–8.5)
eGFR: 95 mL/min/{1.73_m2} (ref >59)

## 2021-06-03 LAB — PSA, TOTAL AND FREE
PSA, Free Pct: 20 %
PSA, Free: 0.38 ng/mL
Prostate Specific Ag, Serum: 1.9 ng/mL (ref 0.0–4.0)

## 2021-06-05 ENCOUNTER — Other Ambulatory Visit: Payer: Self-pay | Admitting: Emergency Medicine

## 2021-06-05 ENCOUNTER — Telehealth: Payer: Self-pay | Admitting: Family Medicine

## 2021-06-05 DIAGNOSIS — E890 Postprocedural hypothyroidism: Secondary | ICD-10-CM

## 2021-06-05 MED ORDER — LEVOTHYROXINE SODIUM 125 MCG PO TABS: 125.0000 ug | ORAL_TABLET | Freq: Every day | ORAL | 1 refills | Status: DC

## 2021-06-06 ENCOUNTER — Ambulatory Visit (INDEPENDENT_AMBULATORY_CARE_PROVIDER_SITE_OTHER): Payer: Medicare HMO | Admitting: Family Medicine

## 2021-06-06 ENCOUNTER — Encounter: Payer: Self-pay | Admitting: Family Medicine

## 2021-06-06 VITALS — BP 138/83 | HR 63 | Temp 98.5°F | Ht 70.0 in | Wt 197.0 lb

## 2021-06-06 DIAGNOSIS — R0989 Other specified symptoms and signs involving the circulatory and respiratory systems: Secondary | ICD-10-CM

## 2021-06-06 DIAGNOSIS — R059 Cough, unspecified: Secondary | ICD-10-CM | POA: Diagnosis not present

## 2021-06-06 MED ORDER — AMOXICILLIN-POT CLAVULANATE 875-125 MG PO TABS: 1.0000 | ORAL_TABLET | Freq: Two times a day (BID) | ORAL | 0 refills | Status: AC

## 2021-06-06 MED ORDER — LEVOCETIRIZINE DIHYDROCHLORIDE 5 MG PO TABS: 5.0000 mg | ORAL_TABLET | Freq: Every evening | ORAL | 3 refills | Status: DC

## 2021-06-06 MED ORDER — MONTELUKAST SODIUM 10 MG PO TABS: 10.0000 mg | ORAL_TABLET | Freq: Every day | ORAL | 3 refills | Status: DC

## 2021-06-06 MED ORDER — FLUTICASONE PROPIONATE 50 MCG/ACT NA SUSP: 2.0000 | Freq: Every day | NASAL | 6 refills | Status: DC

## 2021-06-06 NOTE — Progress Notes (Signed)
?  ? ?Subjective:  ?Patient ID: Robert BarterLarry [name], male    DOB: Mar 02, 1955, 66 y.o.   MRN: [id number] ? ?Patient Care Team: ?Dettinger, Elige RadonJoshua A, MD as PCP - General (Family Medicine) ?Dettinger, Elige RadonJoshua A, MD (Family Medicine)  ? ?Chief Complaint:  No chief complaint on file. ? ? ?HPI: ?Robert BarterLarry [name] is a 66 y.o. male presenting on 06/06/2021 for No chief complaint on file. ? ? ?Pt reports ongoing cough and chest congestion for several weeks. Was treated with zithromax by UC with resolution of symptoms for several days. States symptoms then returned and are slightly worse. Has not been taking Allegra as prescribed.  ? ?URI  ?This is a recurrent problem. The current episode started 1 to 4 weeks ago. The problem has been waxing and waning. There has been no fever. Associated symptoms include congestion and coughing. Pertinent negatives include no abdominal pain, chest pain, diarrhea, dysuria, ear pain, headaches, joint pain, joint swelling, nausea, neck pain, plugged ear sensation, rash, rhinorrhea, sinus pain, sneezing, sore throat, swollen glands, vomiting or wheezing. He has tried decongestant for the symptoms. The treatment provided no relief.  ? ? ?Relevant past medical, surgical, family, and social history reviewed and updated as indicated.  ?Allergies and medications reviewed and updated. Data reviewed: Chart in Epic. ? ? ?Past Medical History:  ?Diagnosis Date  ? Thyroid disease   ? ? ?Past Surgical History:  ?Procedure Laterality Date  ? [facility]  ? HARDWARE REMOVAL Left 08/10/2020  ? Procedure: HARDWARE REMOVAL;  Surgeon: Roby LoftsHaddix, Kevin P, MD;  Location: [facility] OR;  Service: Orthopedics;  Laterality: Left;  ? HERNIA REPAIR  03/2014  ? Umbilical  ? KNEE ARTHROSCOPY Left 08/10/2020  ? Procedure: ARTHROSCOPY KNEE WITH CHONDROPLASTY; PARTIAL MEDIAL MENISCECTOMY LYSIS OF ADHESIONS AND MANIPULATION;  Surgeon: Bjorn PippinVarkey, Dax T, MD;  Location: [facility] OR;  Service: Orthopedics;  Laterality: Left;  ? NO PAST  SURGERIES    ? ORIF PATELLA Left 12/11/2019  ? Procedure: OPEN REDUCTION INTERNAL (ORIF) FIXATION PATELLA;  Surgeon: Roby LoftsHaddix, Kevin P, MD;  Location: [facility] OR;  Service: Orthopedics;  Laterality: Left;  ? ? ?Social History  ? ?Socioeconomic History  ? Marital status: Single  ?  Spouse name: Not on file  ? Number of children: 0  ? Years of education: Not on file  ? Highest education level: Not on file  ?Occupational History  ? Occupation: administer  ?Tobacco Use  ? Smoking status: Former  ?  Packs/day: 0.25  ?  Types: Cigarettes  ?  Start date: 12/08/2000  ?  Quit date: 12/09/2010  ?  Years since quitting: 10.4  ? Smokeless tobacco: Never  ?Vaping Use  ? Vaping Use: Never used  ?Substance and Sexual Activity  ? Alcohol use: Never  ? Drug use: Not Currently  ?  Types: Cocaine  ?  Comment: previous cocaine user. clean for 9 years  ? Sexual activity: Not Currently  ?Other Topics Concern  ? Not on file  ?Social History Narrative  ? ** Merged History Encounter **  ? 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 Brother passed away 03/2020 in a house fire - has nieces and nephews nearby  ? ?Social Determinants of Health  ? ?Financial Resource Strain: Low Risk   ? Difficulty of Paying Living Expenses: Not hard at all  ?Food Insecurity: No Food Insecurity  ? Worried About Programme researcher, broadcasting/film/videounning Out of Food in the Last Year: Never true  ?  Ran Out of Food in the Last Year: Never true  ?Transportation Needs: No Transportation Needs  ? Lack of Transportation (Medical): No  ? Lack of Transportation (Non-Medical): No  ?Physical Activity: Sufficiently Active  ? Days of Exercise per Week: 4 days  ? Minutes of Exercise per Session: 60 min  ?Stress: No Stress Concern Present  ? Feeling of Stress : Only a little  ?Social Connections: Moderately Integrated  ? Frequency of Communication with Friends and Family: More than three times a week  ? Frequency of Social Gatherings with Friends and Family: Twice a week  ? Attends Religious Services: More  than 4 times per year  ? Active Member of Clubs or Organizations: Yes  ? Attends Banker Meetings: More than 4 times per year  ? Marital Status: Never married  ?Intimate Partner Violence: Not At Risk  ? Fear of Current or Ex-Partner: No  ? Emotionally Abused: No  ? Physically Abused: No  ? Sexually Abused: No  ? ? ?Outpatient Encounter Medications as of 06/06/2021  ?Medication Sig  ? amoxicillin-clavulanate (AUGMENTIN) 875-125 MG tablet Take 1 tablet by mouth 2 (two) times daily for 10 days.  ? fluticasone (FLONASE) 50 MCG/ACT nasal spray Place 2 sprays into both nostrils daily.  ? levocetirizine (XYZAL) 5 MG tablet Take 1 tablet (5 mg total) by mouth every evening.  ? levothyroxine (SYNTHROID) 125 MCG tablet Take 1 tablet (125 mcg total) by mouth daily.  ? montelukast (SINGULAIR) 10 MG tablet Take 1 tablet (10 mg total) by mouth at bedtime.  ? acetaminophen (TYLENOL) 325 MG tablet Take 650 mg by mouth every 6 (six) hours as needed for moderate pain.  ? atorvastatin (LIPITOR) 20 MG tablet Take 1 tablet (20 mg total) by mouth daily at 4 PM.  ? diclofenac (VOLTAREN) 75 MG EC tablet Take 1 tablet (75 mg total) by mouth 2 (two) times daily.  ? HYDROcodone-acetaminophen (NORCO) 7.5-325 MG tablet Take 1 tablet by mouth every 4 (four) hours as needed.  ? ibuprofen (ADVIL) 200 MG tablet Take 200 mg by mouth every 6 (six) hours as needed for headache or mild pain.  ? lidocaine (XYLOCAINE) 2 % solution Use as directed 15 mLs in the mouth or throat every 4 (four) hours as needed for mouth pain.  ? magic mouthwash (nystatin, lidocaine, diphenhydrAMINE, alum & mag hydroxide) suspension Swish and swallow 5 mLs 4 (four) times daily.  ? Multiple Vitamins-Minerals (ONE-A-DAY MENS 50+ ADVANTAGE PO) Take 1 tablet by mouth daily.  ? nystatin (MYCOSTATIN/NYSTOP) powder Apply 1 application. topically 3 (three) times daily.  ? oxyCODONE-acetaminophen (PERCOCET/ROXICET) 5-325 MG tablet Take 1 tablet by mouth 4 (four) times  daily as needed.  ? [DISCONTINUED] diphenhydrAMINE (BENADRYL) 25 MG tablet Take 25-50 mg by mouth daily as needed. For allergic reaction  ? [DISCONTINUED] fexofenadine (ALLEGRA) 180 MG tablet 1 po qAM for hives and itching  ? [DISCONTINUED] fluticasone (FLONASE) 50 MCG/ACT nasal spray Place 2 sprays into both nostrils daily. (Patient taking differently: Place 2 sprays into both nostrils daily as needed for allergies or rhinitis.)  ? ?No facility-administered encounter medications on file as of 06/06/2021.  ? ? ?No Known Allergies ? ?Review of Systems  ?Constitutional:  Negative for activity change, appetite change, chills, diaphoresis, fatigue, fever and unexpected weight change.  ?HENT:  Positive for congestion and postnasal drip. Negative for ear pain, rhinorrhea, sinus pressure, sinus pain, sneezing, sore throat, tinnitus, trouble swallowing and voice change.   ?Eyes: Negative.   ?Respiratory:  Positive for cough. Negative for apnea, choking, chest tightness, shortness of breath, wheezing and stridor.   ?Cardiovascular:  Negative for chest pain, palpitations and leg swelling.  ?Gastrointestinal:  Negative for abdominal pain, blood in stool, constipation, diarrhea, nausea and vomiting.  ?Endocrine: Negative.   ?Genitourinary:  Negative for decreased urine volume, difficulty urinating, dysuria, frequency and urgency.  ?Musculoskeletal:  Negative for arthralgias, joint pain, myalgias and neck pain.  ?Skin: Negative.  Negative for rash.  ?Allergic/Immunologic: Negative.   ?Neurological:  Negative for dizziness, tremors, seizures, syncope, facial asymmetry, speech difficulty, weakness, light-headedness, numbness and headaches.  ?Hematological: Negative.   ?Psychiatric/Behavioral:  Negative for confusion, hallucinations, sleep disturbance and suicidal ideas.   ?All other systems reviewed and are negative. ? ?   ? ?Objective:  ?BP 138/83   Pulse 63   Temp 98.5 ?F (36.9 ?C)   Ht 5\' 10"  (1.778 m)   Wt 197 lb (89.4 kg)    SpO2 97%   BMI 28.27 kg/m?   ? ?Wt Readings from Last 3 Encounters:  ?06/06/21 197 lb (89.4 kg)  ?06/02/21 201 lb (91.2 kg)  ?12/01/20 189 lb 9.5 oz (86 kg)  ? ? ?Physical Exam ?Vitals and nursing note revi

## 2021-06-15 ENCOUNTER — Ambulatory Visit (INDEPENDENT_AMBULATORY_CARE_PROVIDER_SITE_OTHER): Payer: Medicare HMO

## 2021-06-15 VITALS — Wt 191.0 lb

## 2021-06-15 DIAGNOSIS — Z0000 Encounter for general adult medical examination without abnormal findings: Secondary | ICD-10-CM | POA: Diagnosis not present

## 2021-06-15 NOTE — Patient Instructions (Signed)
[name] , Thank you for taking time to come for your Medicare Wellness Visit. I appreciate your ongoing commitment to your health goals. Please review the following plan we discussed and let me know if I can assist you in the future.   Screening recommendations/referrals: Colonoscopy: Done 03/15/2014 - Repeat in 10 years Recommended yearly ophthalmology/optometry visit for glaucoma screening and checkup Recommended yearly dental visit for hygiene and checkup  Vaccinations: Influenza vaccine: Due every fall Pneumococcal vaccine: Prevnar-13 done 05/08/2019 - due for Pneumovax-23 Tdap vaccine: Done 12/10/2019 - Repeat in 10 years Shingles vaccine: Due - Shingrix is 2 doses 2-6 months apart and over 90% effective     Covid-19: Declined  Advanced directives: Advance directive discussed with you today. Even though you declined this today, please call our office should you change your mind, and we can give you the proper paperwork for you to fill out.   Conditions/risks identified: Aim for 30 minutes of exercise or brisk walking, 6-8 glasses of water, and 5 servings of fruits and vegetables each day.   Next appointment: Follow up in one year for your annual wellness visit.   Preventive Care 68 Years and Older, Male  Preventive care refers to lifestyle choices and visits with your health care provider that can promote health and wellness. What does preventive care include? A yearly physical exam. This is also called an annual well check. Dental exams once or twice a year. Routine eye exams. Ask your health care provider how often you should have your eyes checked. Personal lifestyle choices, including: Daily care of your teeth and gums. Regular physical activity. Eating a healthy diet. Avoiding tobacco and drug use. Limiting alcohol use. Practicing safe sex. Taking low doses of aspirin every day. Taking vitamin and mineral supplements as recommended by your health care provider. What  happens during an annual well check? The services and screenings done by your health care provider during your annual well check will depend on your age, overall health, lifestyle risk factors, and family history of disease. Counseling  Your health care provider may ask you questions about your: Alcohol use. Tobacco use. Drug use. Emotional well-being. Home and relationship well-being. Sexual activity. Eating habits. History of falls. Memory and ability to understand (cognition). Work and work Astronomer. Screening  You may have the following tests or measurements: Height, weight, and BMI. Blood pressure. Lipid and cholesterol levels. These may be checked every 5 years, or more frequently if you are over 10 years old. Skin check. Lung cancer screening. You may have this screening every year starting at age 68 if you have a 30-pack-year history of smoking and currently smoke or have quit within the past 15 years. Fecal occult blood test (FOBT) of the stool. You may have this test every year starting at age 68. Flexible sigmoidoscopy or colonoscopy. You may have a sigmoidoscopy every 5 years or a colonoscopy every 10 years starting at age 68. Prostate cancer screening. Recommendations will vary depending on your family history and other risks. Hepatitis C blood test. Hepatitis B blood test. Sexually transmitted disease (STD) testing. Diabetes screening. This is done by checking your blood sugar (glucose) after you have not eaten for a while (fasting). You may have this done every 1-3 years. Abdominal aortic aneurysm (AAA) screening. You may need this if you are a current or former smoker. Osteoporosis. You may be screened starting at age 68 if you are at high risk. Talk with your health care provider about your test  results, treatment options, and if necessary, the need for more tests. Vaccines  Your health care provider may recommend certain vaccines, such as: Influenza vaccine. This  is recommended every year. Tetanus, diphtheria, and acellular pertussis (Tdap, Td) vaccine. You may need a Td booster every 10 years. Zoster vaccine. You may need this after age 68. Pneumococcal 13-valent conjugate (PCV13) vaccine. One dose is recommended after age 68. Pneumococcal polysaccharide (PPSV23) vaccine. One dose is recommended after age 68. Talk to your health care provider about which screenings and vaccines you need and how often you need them. This information is not intended to replace advice given to you by your health care provider. Make sure you discuss any questions you have with your health care provider. Document Released: 02/04/2015 Document Revised: 09/28/2015 Document Reviewed: 11/09/2014 Elsevier Interactive Patient Education  2017 [employer].  Fall Prevention in the Home Falls can cause injuries. They can happen to people of all ages. There are many things you can do to make your home safe and to help prevent falls. What can I do on the outside of my home? Regularly fix the edges of walkways and driveways and fix any cracks. Remove anything that might make you trip as you walk through a door, such as a raised step or threshold. Trim any bushes or trees on the path to your home. Use bright outdoor lighting. Clear any walking paths of anything that might make someone trip, such as rocks or tools. Regularly check to see if handrails are loose or broken. Make sure that both sides of any steps have handrails. Any raised decks and porches should have guardrails on the edges. Have any leaves, snow, or ice cleared regularly. Use sand or salt on walking paths during winter. Clean up any spills in your garage right away. This includes oil or grease spills. What can I do in the bathroom? Use night lights. Install grab bars by the toilet and in the tub and shower. Do not use towel bars as grab bars. Use non-skid mats or decals in the tub or shower. If you need to sit down  in the shower, use a plastic, non-slip stool. Keep the floor dry. Clean up any water that spills on the floor as soon as it happens. Remove soap buildup in the tub or shower regularly. Attach bath mats securely with double-sided non-slip rug tape. Do not have throw rugs and other things on the floor that can make you trip. What can I do in the bedroom? Use night lights. Make sure that you have a light by your bed that is easy to reach. Do not use any sheets or blankets that are too big for your bed. They should not hang down onto the floor. Have a firm chair that has side arms. You can use this for support while you get dressed. Do not have throw rugs and other things on the floor that can make you trip. What can I do in the kitchen? Clean up any spills right away. Avoid walking on wet floors. Keep items that you use a lot in easy-to-reach places. If you need to reach something above you, use a strong step stool that has a grab bar. Keep electrical cords out of the way. Do not use floor polish or wax that makes floors slippery. If you must use wax, use non-skid floor wax. Do not have throw rugs and other things on the floor that can make you trip. What can I do with my stairs?  Do not leave any items on the stairs. Make sure that there are handrails on both sides of the stairs and use them. Fix handrails that are broken or loose. Make sure that handrails are as long as the stairways. Check any carpeting to make sure that it is firmly attached to the stairs. Fix any carpet that is loose or worn. Avoid having throw rugs at the top or bottom of the stairs. If you do have throw rugs, attach them to the floor with carpet tape. Make sure that you have a light switch at the top of the stairs and the bottom of the stairs. If you do not have them, ask someone to add them for you. What else can I do to help prevent falls? Wear shoes that: Do not have high heels. Have rubber bottoms. Are comfortable  and fit you well. Are closed at the toe. Do not wear sandals. If you use a stepladder: Make sure that it is fully opened. Do not climb a closed stepladder. Make sure that both sides of the stepladder are locked into place. Ask someone to hold it for you, if possible. Clearly mark and make sure that you can see: Any grab bars or handrails. First and last steps. Where the edge of each step is. Use tools that help you move around (mobility aids) if they are needed. These include: Canes. Walkers. Scooters. Crutches. Turn on the lights when you go into a dark area. Replace any light bulbs as soon as they burn out. Set up your furniture so you have a clear path. Avoid moving your furniture around. If any of your floors are uneven, fix them. If there are any pets around you, be aware of where they are. Review your medicines with your doctor. Some medicines can make you feel dizzy. This can increase your chance of falling. Ask your doctor what other things that you can do to help prevent falls. This information is not intended to replace advice given to you by your health care provider. Make sure you discuss any questions you have with your health care provider. Document Released: 11/04/2008 Document Revised: 06/16/2015 Document Reviewed: 02/12/2014 Elsevier Interactive Patient Education  2017 [employer].

## 2021-06-15 NOTE — Progress Notes (Signed)
Subjective:   Robert [name] is a 66 y.o. male who presents for Medicare Annual/Subsequent preventive examination.  Virtual Visit via Telephone Note  I connected with  Robert [name] on 06/15/21 at  2:00 PM EDT by telephone and verified that I am speaking with the correct person using two identifiers.  Location: Patient: Home Provider: [facility] Persons participating in the virtual visit: patient/Nurse Health Advisor   I discussed the limitations, risks, security and privacy concerns of performing an evaluation and management service by telephone and the availability of in person appointments. The patient expressed understanding and agreed to proceed.  Interactive audio and video telecommunications were attempted between this nurse and patient, however failed, due to patient having technical difficulties OR patient did not have access to video capability.  We continued and completed visit with audio only.  Some vital signs may be absent or patient reported.   Brae Gartman E Camille Thau, LPN   Review of Systems     Cardiac Risk Factors include: advanced age (>86men, >79 women);dyslipidemia;male gender     Objective:    Today's Vitals   06/15/21 1236  Weight: 191 lb (86.6 kg)  PainSc: 7    Body mass index is 27.41 kg/m.     06/15/2021   12:47 PM 12/01/2020    6:40 PM 08/10/2020    7:03 AM 06/14/2020    1:52 PM 02/01/2020    9:56 AM 12/10/2019    6:21 PM 12/10/2019    2:24 PM  Advanced Directives  Does Patient Have a Medical Advance Directive? No No No No No    Would patient like information on creating a medical advance directive? No - Patient declined  No - Patient declined Yes (MAU/Ambulatory/Procedural Areas - Information given)        Information is confidential and restricted. Go to Review Flowsheets to unlock data.    Current Medications (verified) Outpatient Encounter Medications as of 06/15/2021  Medication Sig   acetaminophen (TYLENOL) 325 MG tablet Take 650 mg by mouth every 6  (six) hours as needed for moderate pain.   atorvastatin (LIPITOR) 20 MG tablet Take 1 tablet (20 mg total) by mouth daily at 4 PM.   fluticasone (FLONASE) 50 MCG/ACT nasal spray Place 2 sprays into both nostrils daily.   ibuprofen (ADVIL) 200 MG tablet Take 200 mg by mouth every 6 (six) hours as needed for headache or mild pain.   levocetirizine (XYZAL) 5 MG tablet Take 1 tablet (5 mg total) by mouth every evening.   levothyroxine (SYNTHROID) 125 MCG tablet Take 1 tablet (125 mcg total) by mouth daily.   Multiple Vitamins-Minerals (ONE-A-DAY MENS 50+ ADVANTAGE PO) Take 1 tablet by mouth daily.   nystatin (MYCOSTATIN/NYSTOP) powder Apply 1 application. topically 3 (three) times daily.   amoxicillin-clavulanate (AUGMENTIN) 875-125 MG tablet Take 1 tablet by mouth 2 (two) times daily for 10 days. (Patient not taking: Reported on 06/15/2021)   diclofenac (VOLTAREN) 75 MG EC tablet Take 1 tablet (75 mg total) by mouth 2 (two) times daily. (Patient not taking: Reported on 06/15/2021)   montelukast (SINGULAIR) 10 MG tablet Take 1 tablet (10 mg total) by mouth at bedtime. (Patient not taking: Reported on 06/15/2021)   [DISCONTINUED] diphenhydrAMINE (BENADRYL) 25 MG tablet Take 25-50 mg by mouth daily as needed. For allergic reaction   [DISCONTINUED] fexofenadine (ALLEGRA) 180 MG tablet 1 po qAM for hives and itching   [DISCONTINUED] HYDROcodone-acetaminophen (NORCO) 7.5-325 MG tablet Take 1 tablet by mouth every 4 (four) hours as needed. (Patient  not taking: Reported on 06/15/2021)   [DISCONTINUED] lidocaine (XYLOCAINE) 2 % solution Use as directed 15 mLs in the mouth or throat every 4 (four) hours as needed for mouth pain. (Patient not taking: Reported on 06/15/2021)   [DISCONTINUED] magic mouthwash (nystatin, lidocaine, diphenhydrAMINE, alum & mag hydroxide) suspension Swish and swallow 5 mLs 4 (four) times daily. (Patient not taking: Reported on 06/15/2021)   [DISCONTINUED] oxyCODONE-acetaminophen  (PERCOCET/ROXICET) 5-325 MG tablet Take 1 tablet by mouth 4 (four) times daily as needed. (Patient not taking: Reported on 06/15/2021)   No facility-administered encounter medications on file as of 06/15/2021.    Allergies (verified) Patient has no known allergies.   History: Past Medical History:  Diagnosis Date   Thyroid disease    Past Surgical History:  Procedure Laterality Date   COLONOSCOPY  1980's   in [facility]   HARDWARE REMOVAL Left 08/10/2020   Procedure: HARDWARE REMOVAL;  Surgeon: Roby Lofts, MD;  Location: [facility] OR;  Service: Orthopedics;  Laterality: Left;   HERNIA REPAIR  05-May-2014   Umbilical   KNEE ARTHROSCOPY Left 08/10/2020   Procedure: ARTHROSCOPY KNEE WITH CHONDROPLASTY; PARTIAL MEDIAL MENISCECTOMY LYSIS OF ADHESIONS AND MANIPULATION;  Surgeon: Bjorn Pippin, MD;  Location: [facility] OR;  Service: Orthopedics;  Laterality: Left;   NO PAST SURGERIES     ORIF PATELLA Left 12/11/2019   Procedure: OPEN REDUCTION INTERNAL (ORIF) FIXATION PATELLA;  Surgeon: Roby Lofts, MD;  Location: [facility] OR;  Service: Orthopedics;  Laterality: Left;   Family History  Problem Relation Age of Onset   Heart Problems Mother    Suicidality Father    Thyroid disease Neg Hx    Colon cancer Neg Hx    Social History   Socioeconomic History   Marital status: Single    Spouse name: Not on file   Number of children: 0   Years of education: Not on file   Highest education level: Not on file  Occupational History   Occupation: administer  Tobacco Use   Smoking status: Former    Packs/day: 0.25    Types: Cigarettes    Start date: 12/08/2000    Quit date: 12/09/2010    Years since quitting: 10.5   Smokeless tobacco: Never  Vaping Use   Vaping Use: Never used  Substance and Sexual Activity   Alcohol use: Never   Drug use: Not Currently    Types: Cocaine    Comment: previous cocaine user. clean for 9 years   Sexual activity: Not Currently  Other Topics Concern   Not on file   Social History Narrative   ** Merged History Encounter **   Lives alone; 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   Brother passed away 05/04/2020 in a house fire - has nieces and nephews nearby   Social Determinants of Health   Financial Resource Strain: Low Risk    Difficulty of Paying Living Expenses: Not hard at all  Food Insecurity: No Food Insecurity   Worried About Programme researcher, broadcasting/film/video in the Last Year: Never true   Barista in the Last Year: Never true  Transportation Needs: No Transportation Needs   Lack of Transportation (Medical): No   Lack of Transportation (Non-Medical): No  Physical Activity: Sufficiently Active   Days of Exercise per Week: 4 days   Minutes of Exercise per Session: 60 min  Stress: No Stress Concern Present   Feeling of Stress : Only a little  Social Connections: Moderately Integrated  Frequency of Communication with Friends and Family: More than three times a week   Frequency of Social Gatherings with Friends and Family: Twice a week   Attends Religious Services: More than 4 times per year   Active Member of [employer] or Organizations: Yes   Attends Engineer, structural: More than 4 times per year   Marital Status: Never married    Tobacco Counseling Counseling given: Not Answered   Clinical Intake:  Pre-visit preparation completed: Yes  Pain : 0-10 Pain Score: 7  Pain Type: Acute pain Pain Location: Throat Pain Orientation: Anterior Pain Descriptors / Indicators: Aching, Sore Pain Onset: In the past 7 days Pain Frequency: Intermittent     BMI - recorded: 27.41 Nutritional Status: BMI 25 -29 Overweight Nutritional Risks: Nausea/ vomitting/ diarrhea (just finished antibiotics) Diabetes: No  How often do you need to have someone help you when you read instructions, pamphlets, or other written materials from your doctor or pharmacy?: 1 - Never  Diabetic? no  Interpreter Needed?: No  Information entered by :: Israel Werts,  LPN   Activities of Daily Living    06/15/2021   12:47 PM 08/10/2020    7:05 AM  In your present state of health, do you have any difficulty performing the following activities:  Hearing? 0 0  Vision? 0 0  Difficulty concentrating or making decisions? 0 0  Walking or climbing stairs? 0 1  Dressing or bathing? 0 0  Doing errands, shopping? 0   Preparing Food and eating ? N   Using the Toilet? N   In the past six months, have you accidently leaked urine? N   Do you have problems with loss of bowel control? N   Managing your Medications? N   Managing your Finances? N   Housekeeping or managing your Housekeeping? N     Patient Care Team: Dettinger, Elige Radon, MD as PCP - General (Family Medicine) Dettinger, Elige Radon, MD (Family Medicine) Delora Fuel, OD (Optometry)  Indicate any recent Medical Services you may have received from other than [facility] providers in the past year (date may be approximate).     Assessment:   This is a routine wellness examination for Robert [name].  Hearing/Vision screen Hearing Screening - Comments:: Denies hearing difficulties   Vision Screening - Comments:: Wears rx glasses - up to date with routine eye exams with [facility]  Dietary issues and exercise activities discussed: Current Exercise Habits: Home exercise routine, Type of exercise: walking, Time (Minutes): 40, Frequency (Times/Week): 4, Weekly Exercise (Minutes/Week): 160, Intensity: Mild, Exercise limited by: orthopedic condition(s)   Goals Addressed             This Visit's Progress    Patient Stated   On track    He'd like to make time to study more and lose weight down to 180#       Depression Screen    06/15/2021   12:44 PM 06/06/2021    2:39 PM 06/02/2021   10:48 AM 12/02/2020    2:24 PM 11/25/2020   11:42 AM 11/22/2020   10:39 AM 11/14/2020    9:06 AM  PHQ 2/9 Scores  PHQ - 2 Score 0 0 0 0 0 0 0  PHQ- 9 Score 0 0 0 0 0 0 0    Fall Risk    06/15/2021   12:45 PM  06/02/2021   10:48 AM 11/22/2020   10:38 AM 11/14/2020    9:06 AM 10/19/2020    8:16 AM  Fall Risk   Falls in the past year? 0 0 0 0 0  Number falls in past yr: 0      Injury with Fall? 0      Risk for fall due to : Orthopedic patient      Follow up Falls prevention discussed  Falls evaluation completed      FALL RISK PREVENTION PERTAINING TO THE HOME:  Any stairs in or around the home? No  If so, are there any without handrails? No  Home free of loose throw rugs in walkways, pet beds, electrical cords, etc? Yes  Adequate lighting in your home to reduce risk of falls? Yes   ASSISTIVE DEVICES UTILIZED TO PREVENT FALLS:  Life alert? No  Use of a cane, walker or w/c? No  Grab bars in the bathroom? No  Shower chair or bench in shower? No  Elevated toilet seat or a handicapped toilet? No   TIMED UP AND GO:  Was the test performed? No . Telephonic visit  Cognitive Function:        06/15/2021   12:49 PM 06/14/2020    1:43 PM  6CIT Screen  What Year? 0 points 0 points  What month? 0 points 0 points  What time? 0 points 0 points  Count back from 20 0 points 0 points  Months in reverse 0 points 2 points  Repeat phrase 2 points 0 points  Total Score 2 points 2 points    Immunizations Immunization History  Administered Date(s) Administered   Fluad Quad(high Dose 65+) 10/21/2019   Influenza,inj,Quad PF,6+ Mos 11/21/2017, 10/01/2018   Influenza,trivalent, recombinat, inj, PF 10/26/2016   Influenza-Unspecified 10/31/2015   Pneumococcal Conjugate-13 05/08/2019   Tdap 12/10/2019    TDAP status: Up to date  Flu Vaccine status: Declined, Education has been provided regarding the importance of this vaccine but patient still declined. Advised may receive this vaccine at local pharmacy or Health Dept. Aware to provide a copy of the vaccination record if obtained from local pharmacy or Health Dept. Verbalized acceptance and understanding.  Pneumococcal vaccine status: Due,  Education has been provided regarding the importance of this vaccine. Advised may receive this vaccine at local pharmacy or Health Dept. Aware to provide a copy of the vaccination record if obtained from local pharmacy or Health Dept. Verbalized acceptance and understanding.  Covid-19 vaccine status: Declined, Education has been provided regarding the importance of this vaccine but patient still declined. Advised may receive this vaccine at local pharmacy or Health Dept.or vaccine clinic. Aware to provide a copy of the vaccination record if obtained from local pharmacy or Health Dept. Verbalized acceptance and understanding.  Qualifies for Shingles Vaccine? Yes   Zostavax completed No   Shingrix Completed?: No.    Education has been provided regarding the importance of this vaccine. Patient has been advised to call insurance company to determine out of pocket expense if they have not yet received this vaccine. Advised may also receive vaccine at local pharmacy or Health Dept. Verbalized acceptance and understanding.  Screening Tests Health Maintenance  Topic Date Due   Zoster Vaccines- Shingrix (1 of 2) 10/09/2021 (Originally 01/04/2004)   COVID-19 Vaccine (1) 11/06/2021 (Originally 07/05/1956)   Pneumonia Vaccine 3465+ Years old (2 - PPSV23 if available, else PCV20) 06/03/2022 (Originally 05/07/2020)   INFLUENZA VACCINE  08/22/2021   COLONOSCOPY (Pts 45-1882yrs Insurance coverage will need to be confirmed)  03/15/2024   TETANUS/TDAP  12/09/2029   Hepatitis C Screening  Completed   HPV  VACCINES  Aged Out    Health Maintenance  There are no preventive care reminders to display for this patient.  Colorectal cancer screening: Type of screening: Colonoscopy. Completed 03/15/2014. Repeat every 10 years  Lung Cancer Screening: (Low Dose CT Chest recommended if Age 56-80 years, 30 pack-year currently smoking OR have quit w/in 15years.) does not qualify.  Additional Screening:  Hepatitis C Screening:  does qualify; Completed 09/06/2016  Vision Screening: Recommended annual ophthalmology exams for early detection of glaucoma and other disorders of the eye. Is the patient up to date with their annual eye exam?  Yes  Who is the provider or what is the name of the office in which the patient attends annual eye exams? [facility] If pt is not established with a provider, would they like to be referred to a provider to establish care? No .   Dental Screening: Recommended annual dental exams for proper oral hygiene  Community Resource Referral / Chronic Care Management: CRR required this visit?  No   CCM required this visit?  No      Plan:     I have personally reviewed and noted the following in the patient's chart:   Medical and social history Use of alcohol, tobacco or illicit drugs  Current medications and supplements including opioid prescriptions. Patient is not currently taking opioid prescriptions. Functional ability and status Nutritional status Physical activity Advanced directives List of other physicians Hospitalizations, surgeries, and ER visits in previous 12 months Vitals Screenings to include cognitive, depression, and falls Referrals and appointments  In addition, I have reviewed and discussed with patient certain preventive protocols, quality metrics, and best practice recommendations. A written personalized care plan for preventive services as well as general preventive health recommendations were provided to patient.     [place] Constable, LPN   9/83/3825   Nurse Notes: He has had congestion for about 3 weeks now - saw Kari Baars last week, given Augmentin which upset his stomach, so he stopped taking - made appt for tomorrow to f/u with her again.

## 2021-06-16 ENCOUNTER — Ambulatory Visit: Payer: Medicare HMO | Admitting: Family Medicine

## 2021-06-20 ENCOUNTER — Encounter: Payer: Self-pay | Admitting: Family Medicine

## 2021-06-20 ENCOUNTER — Ambulatory Visit: Payer: Medicare HMO | Admitting: Family Medicine

## 2021-07-19 DIAGNOSIS — I951 Orthostatic hypotension: Secondary | ICD-10-CM | POA: Diagnosis not present

## 2021-07-19 DIAGNOSIS — R1013 Epigastric pain: Secondary | ICD-10-CM | POA: Diagnosis not present

## 2021-07-19 DIAGNOSIS — K5900 Constipation, unspecified: Secondary | ICD-10-CM | POA: Diagnosis not present

## 2021-07-19 DIAGNOSIS — E860 Dehydration: Secondary | ICD-10-CM | POA: Diagnosis not present

## 2021-07-27 DIAGNOSIS — B349 Viral infection, unspecified: Secondary | ICD-10-CM | POA: Diagnosis not present

## 2021-10-05 DIAGNOSIS — J0100 Acute maxillary sinusitis, unspecified: Secondary | ICD-10-CM | POA: Diagnosis not present

## 2021-10-05 DIAGNOSIS — R051 Acute cough: Secondary | ICD-10-CM | POA: Diagnosis not present

## 2021-10-06 ENCOUNTER — Ambulatory Visit: Payer: Medicare HMO

## 2021-11-03 ENCOUNTER — Other Ambulatory Visit: Payer: Self-pay | Admitting: Family Medicine

## 2021-11-03 DIAGNOSIS — E782 Mixed hyperlipidemia: Secondary | ICD-10-CM

## 2021-11-12 ENCOUNTER — Other Ambulatory Visit: Payer: Self-pay | Admitting: Family Medicine

## 2021-11-12 DIAGNOSIS — E890 Postprocedural hypothyroidism: Secondary | ICD-10-CM

## 2021-11-16 DIAGNOSIS — H25813 Combined forms of age-related cataract, bilateral: Secondary | ICD-10-CM | POA: Diagnosis not present

## 2021-11-16 DIAGNOSIS — H40023 Open angle with borderline findings, high risk, bilateral: Secondary | ICD-10-CM | POA: Diagnosis not present

## 2021-11-20 ENCOUNTER — Other Ambulatory Visit: Payer: Self-pay | Admitting: Family Medicine

## 2021-11-20 DIAGNOSIS — E890 Postprocedural hypothyroidism: Secondary | ICD-10-CM

## 2021-12-04 ENCOUNTER — Ambulatory Visit (INDEPENDENT_AMBULATORY_CARE_PROVIDER_SITE_OTHER): Payer: Medicare HMO | Admitting: Family Medicine

## 2021-12-04 ENCOUNTER — Encounter: Payer: Self-pay | Admitting: Family Medicine

## 2021-12-04 VITALS — BP 125/75 | HR 66 | Temp 98.0°F | Ht 70.0 in | Wt 181.0 lb

## 2021-12-04 DIAGNOSIS — Z125 Encounter for screening for malignant neoplasm of prostate: Secondary | ICD-10-CM | POA: Diagnosis not present

## 2021-12-04 DIAGNOSIS — B356 Tinea cruris: Secondary | ICD-10-CM | POA: Diagnosis not present

## 2021-12-04 DIAGNOSIS — E782 Mixed hyperlipidemia: Secondary | ICD-10-CM | POA: Diagnosis not present

## 2021-12-04 DIAGNOSIS — E890 Postprocedural hypothyroidism: Secondary | ICD-10-CM | POA: Diagnosis not present

## 2021-12-04 MED ORDER — LEVOTHYROXINE SODIUM 125 MCG PO TABS: 125.0000 ug | ORAL_TABLET | Freq: Every day | ORAL | 3 refills | Status: DC

## 2021-12-04 MED ORDER — NYSTATIN 100000 UNIT/GM EX CREA: 1.0000 | TOPICAL_CREAM | Freq: Two times a day (BID) | CUTANEOUS | 3 refills | Status: DC

## 2021-12-04 MED ORDER — ATORVASTATIN CALCIUM 20 MG PO TABS: 20.0000 mg | ORAL_TABLET | Freq: Every day | ORAL | 3 refills | Status: DC

## 2021-12-04 NOTE — Progress Notes (Signed)
BP 125/75   Pulse 66   Temp 98 F (36.7 C)   Ht _0  (1.778 m)   Wt 181 lb (82.1 kg)   SpO2 98%   BMI 25.97 kg/m    Subjective:   Patient ID: Robert [name], male    DOB: 1955-08-15, 66 y.o.   MRN: [id number]  HPI: Robert [name] is a 66 y.o. male presenting on 12/04/2021 for Medical Management of Chronic Issues, Hyperlipidemia, and Hypothyroidism   HPI Hypothyroidism recheck Patient is coming in for thyroid recheck today as well. They deny any issues with hair changes or heat or cold problems or diarrhea or constipation. They deny any chest pain or palpitations. They are currently on levothyroxine 125 micrograms   Hyperlipidemia Patient is coming in for recheck of his hyperlipidemia. The patient is currently taking atorvastatin. They deny any issues with myalgias or history of liver damage from it. They deny any focal numbness or weakness or chest pain.   Patient still gets jock itch, and says the powder did not work as well, cream worked better.  Relevant past medical, surgical, family and social history reviewed and updated as indicated. Interim medical history since our last visit reviewed. Allergies and medications reviewed and updated.  Review of Systems  Constitutional:  Negative for chills and fever.  Eyes:  Negative for visual disturbance.  Respiratory:  Negative for shortness of breath and wheezing.   Cardiovascular:  Negative for chest pain and leg swelling.  Musculoskeletal:  Negative for back pain and gait problem.  Skin:  Negative for rash.  Neurological:  Negative for dizziness, weakness and light-headedness.  All other systems reviewed and are negative.   Per HPI unless specifically indicated above   Allergies as of 12/04/2021   No Known Allergies      Medication List        Accurate as of December 04, 2021 12:10 PM. If you have any questions, ask your nurse or doctor.          STOP taking these medications    nystatin powder Commonly  known as: MYCOSTATIN/NYSTOP Replaced by: nystatin cream Stopped by: Fransisca Kaufmann Kahlel Peake, MD       TAKE these medications    acetaminophen 325 MG tablet Commonly known as: TYLENOL Take 650 mg by mouth every 6 (six) hours as needed for moderate pain.   atorvastatin 20 MG tablet Commonly known as: LIPITOR Take 1 tablet (20 mg total) by mouth daily at 4 PM.   diclofenac 75 MG EC tablet Commonly known as: VOLTAREN Take 1 tablet (75 mg total) by mouth 2 (two) times daily.   fluticasone 50 MCG/ACT nasal spray Commonly known as: FLONASE Place 2 sprays into both nostrils daily.   ibuprofen 200 MG tablet Commonly known as: ADVIL Take 200 mg by mouth every 6 (six) hours as needed for headache or mild pain.   levocetirizine 5 MG tablet Commonly known as: XYZAL Take 1 tablet (5 mg total) by mouth every evening.   levothyroxine 125 MCG tablet Commonly known as: SYNTHROID Take 1 tablet (125 mcg total) by mouth daily.   montelukast 10 MG tablet Commonly known as: SINGULAIR Take 1 tablet (10 mg total) by mouth at bedtime.   nystatin cream Commonly known as: MYCOSTATIN Apply 1 Application topically 2 (two) times daily. Replaces: nystatin powder Started by: Fransisca Kaufmann Seanpaul Preece, MD   ONE-A-DAY MENS 50+ ADVANTAGE PO Take 1 tablet by mouth daily.         Objective:  BP 125/75   Pulse 66   Temp 98 F (36.7 C)   Ht _0  (1.778 m)   Wt 181 lb (82.1 kg)   SpO2 98%   BMI 25.97 kg/m   Wt Readings from Last 3 Encounters:  12/04/21 181 lb (82.1 kg)  06/15/21 191 lb (86.6 kg)  06/06/21 197 lb (89.4 kg)    Physical Exam Vitals and nursing note reviewed.  Constitutional:      General: He is not in acute distress.    Appearance: He is well-developed. He is not diaphoretic.  Eyes:     General: No scleral icterus.    Conjunctiva/sclera: Conjunctivae normal.  Neck:     Thyroid: No thyromegaly.  Cardiovascular:     Rate and Rhythm: Normal rate and regular rhythm.      Heart sounds: Normal heart sounds. No murmur heard. Pulmonary:     Effort: Pulmonary effort is normal. No respiratory distress.     Breath sounds: Normal breath sounds. No wheezing.  Musculoskeletal:        General: No swelling. Normal range of motion.     Cervical back: Neck supple.  Lymphadenopathy:     Cervical: No cervical adenopathy.  Skin:    General: Skin is warm and dry.     Findings: No rash.  Neurological:     Mental Status: He is alert and oriented to person, place, and time.     Coordination: Coordination normal.  Psychiatric:        Behavior: Behavior normal.       Assessment & Plan:   Problem List Items Addressed This Visit       Endocrine   Hypothyroidism following radioiodine therapy   Relevant Medications   levothyroxine (SYNTHROID) 125 MCG tablet   Other Relevant Orders   CBC with Differential/Platelet   CMP14+EGFR   TSH     Other   Hyperlipidemia - Primary   Relevant Medications   atorvastatin (LIPITOR) 20 MG tablet   Other Relevant Orders   CBC with Differential/Platelet   Lipid panel   Other Visit Diagnoses     Prostate cancer screening       Relevant Orders   PSA, total and free   Jock itch       Relevant Medications   nystatin cream (MYCOSTATIN)     We will give jock itch cream because this is worked better than the powder.  He fights this off and on but keeps it up by 8.  Recheck thyroid and cholesterol levels through blood work.  Due for colonoscopy in 2016, he asked about when he was due.  Follow up plan: Return in about 6 months (around 06/04/2022), or if symptoms worsen or fail to improve, for Hypothyroidism and hyperlipidemia.  Counseling provided for all of the vaccine components Orders Placed This Encounter  Procedures   CBC with Differential/Platelet   CMP14+EGFR   Lipid panel   TSH   PSA, total and free    Caryl Pina, MD [facility] 12/04/2021, 12:10 PM

## 2021-12-05 LAB — PSA, TOTAL AND FREE
PSA, Free Pct: 15.1 %
PSA, Free: 0.74 ng/mL
Prostate Specific Ag, Serum: 4.9 ng/mL — ABNORMAL HIGH (ref 0.0–4.0)

## 2021-12-05 LAB — CBC WITH DIFFERENTIAL/PLATELET
Basophils Absolute: 0 10*3/uL (ref 0.0–0.2)
Basos: 0 %
EOS (ABSOLUTE): 0.1 10*3/uL (ref 0.0–0.4)
Eos: 1 %
Hematocrit: 39.1 % (ref 37.5–51.0)
Hemoglobin: 13.2 g/dL (ref 13.0–17.7)
Immature Grans (Abs): 0 10*3/uL (ref 0.0–0.1)
Immature Granulocytes: 0 %
Lymphocytes Absolute: 1.3 10*3/uL (ref 0.7–3.1)
Lymphs: 25 %
MCH: 31.9 pg (ref 26.6–33.0)
MCHC: 33.8 g/dL (ref 31.5–35.7)
MCV: 94 fL (ref 79–97)
Monocytes Absolute: 0.4 10*3/uL (ref 0.1–0.9)
Monocytes: 7 %
Neutrophils Absolute: 3.4 10*3/uL (ref 1.4–7.0)
Neutrophils: 67 %
Platelets: 208 10*3/uL (ref 150–450)
RBC: 4.14 x10E6/uL (ref 4.14–5.80)
RDW: 12.7 % (ref 11.6–15.4)
WBC: 5.1 10*3/uL (ref 3.4–10.8)

## 2021-12-05 LAB — CMP14+EGFR
ALT: 20 IU/L (ref 0–44)
AST: 48 IU/L — ABNORMAL HIGH (ref 0–40)
Albumin/Globulin Ratio: 1.5 (ref 1.2–2.2)
Albumin: 4.5 g/dL (ref 3.9–4.9)
Alkaline Phosphatase: 71 IU/L (ref 44–121)
BUN/Creatinine Ratio: 12 (ref 10–24)
BUN: 12 mg/dL (ref 8–27)
Bilirubin Total: 0.6 mg/dL (ref 0.0–1.2)
CO2: 24 mmol/L (ref 20–29)
Calcium: 9.3 mg/dL (ref 8.6–10.2)
Chloride: 101 mmol/L (ref 96–106)
Creatinine, Ser: 0.99 mg/dL (ref 0.76–1.27)
Globulin, Total: 3 g/dL (ref 1.5–4.5)
Glucose: 82 mg/dL (ref 70–99)
Potassium: 3.5 mmol/L (ref 3.5–5.2)
Sodium: 141 mmol/L (ref 134–144)
Total Protein: 7.5 g/dL (ref 6.0–8.5)
eGFR: 83 mL/min/{1.73_m2} (ref >59)

## 2021-12-05 LAB — LIPID PANEL
Chol/HDL Ratio: 3.1 ratio (ref 0.0–5.0)
Cholesterol, Total: 169 mg/dL (ref 100–199)
HDL: 55 mg/dL (ref >39)
LDL Chol Calc (NIH): 100 mg/dL — ABNORMAL HIGH (ref 0–99)
Triglycerides: 75 mg/dL (ref 0–149)
VLDL Cholesterol Cal: 14 mg/dL (ref 5–40)

## 2021-12-05 LAB — TSH: TSH: 2.61 u[IU]/mL (ref 0.450–4.500)

## 2021-12-06 DIAGNOSIS — R109 Unspecified abdominal pain: Secondary | ICD-10-CM | POA: Diagnosis not present

## 2021-12-06 DIAGNOSIS — K5900 Constipation, unspecified: Secondary | ICD-10-CM | POA: Diagnosis not present

## 2021-12-21 DIAGNOSIS — E559 Vitamin D deficiency, unspecified: Secondary | ICD-10-CM | POA: Diagnosis not present

## 2022-02-02 DIAGNOSIS — J00 Acute nasopharyngitis [common cold]: Secondary | ICD-10-CM | POA: Diagnosis not present

## 2022-03-01 ENCOUNTER — Encounter: Payer: Self-pay | Admitting: *Deleted

## 2022-03-20 DIAGNOSIS — L03119 Cellulitis of unspecified part of limb: Secondary | ICD-10-CM | POA: Diagnosis not present

## 2022-03-22 ENCOUNTER — Ambulatory Visit: Payer: Medicare HMO

## 2022-03-22 ENCOUNTER — Telehealth: Payer: Self-pay

## 2022-03-22 ENCOUNTER — Ambulatory Visit: Payer: Medicare HMO | Admitting: Family Medicine

## 2022-03-22 DIAGNOSIS — Z0130 Encounter for examination of blood pressure without abnormal findings: Secondary | ICD-10-CM

## 2022-03-22 NOTE — Progress Notes (Signed)
Patient here today to get blood pressure checked because it was elevated at home earlier today.  He said he has been under a tremendous amount of stress because he is allowed someone to move into his home.  His blood pressure at home earlier today was 150/92.  Patient's blood pressure here today was 112/75, pulse 74.

## 2022-04-02 DIAGNOSIS — F419 Anxiety disorder, unspecified: Secondary | ICD-10-CM | POA: Diagnosis not present

## 2022-04-02 DIAGNOSIS — L03112 Cellulitis of left axilla: Secondary | ICD-10-CM | POA: Diagnosis not present

## 2022-04-02 DIAGNOSIS — R69 Illness, unspecified: Secondary | ICD-10-CM | POA: Diagnosis not present

## 2022-04-02 DIAGNOSIS — R3589 Other polyuria: Secondary | ICD-10-CM | POA: Diagnosis not present

## 2022-04-02 DIAGNOSIS — E079 Disorder of thyroid, unspecified: Secondary | ICD-10-CM | POA: Diagnosis not present

## 2022-04-20 DIAGNOSIS — B349 Viral infection, unspecified: Secondary | ICD-10-CM | POA: Diagnosis not present

## 2022-04-20 DIAGNOSIS — Z79899 Other long term (current) drug therapy: Secondary | ICD-10-CM | POA: Diagnosis not present

## 2022-04-20 DIAGNOSIS — R112 Nausea with vomiting, unspecified: Secondary | ICD-10-CM | POA: Diagnosis not present

## 2022-04-20 DIAGNOSIS — E785 Hyperlipidemia, unspecified: Secondary | ICD-10-CM | POA: Diagnosis not present

## 2022-04-20 DIAGNOSIS — R197 Diarrhea, unspecified: Secondary | ICD-10-CM | POA: Diagnosis not present

## 2022-04-20 DIAGNOSIS — Z96659 Presence of unspecified artificial knee joint: Secondary | ICD-10-CM | POA: Diagnosis not present

## 2022-04-24 DIAGNOSIS — H524 Presbyopia: Secondary | ICD-10-CM | POA: Diagnosis not present

## 2022-04-24 DIAGNOSIS — H25813 Combined forms of age-related cataract, bilateral: Secondary | ICD-10-CM | POA: Diagnosis not present

## 2022-04-24 DIAGNOSIS — H40023 Open angle with borderline findings, high risk, bilateral: Secondary | ICD-10-CM | POA: Diagnosis not present

## 2022-04-24 DIAGNOSIS — H52223 Regular astigmatism, bilateral: Secondary | ICD-10-CM | POA: Diagnosis not present

## 2022-04-24 DIAGNOSIS — H5203 Hypermetropia, bilateral: Secondary | ICD-10-CM | POA: Diagnosis not present

## 2022-05-05 IMAGING — CT CT MAXILLOFACIAL W/O CM
3 of 6 series · 14 of 47 positions shown, 17 images · non-contrast
Comparison: None.

CLINICAL DATA: Fall from ladder

EXAM:
CT HEAD WITHOUT CONTRAST
CT MAXILLOFACIAL WITHOUT CONTRAST
CT CERVICAL SPINE WITHOUT CONTRAST
TECHNIQUE: Multidetector CT imaging of the head, cervical spine, and
maxillofacial structures were performed using the standard protocol
without intravenous contrast. Multiplanar CT image reconstructions
of the cervical spine and maxillofacial structures were also
generated.

[Series 3: maxilllofacial 2.0 hr40 3 · axial · 0.38mm/px · z∈[-205,-61]mm · 9 of 86 slices shown, 12 images]
[im 7/86  brain]
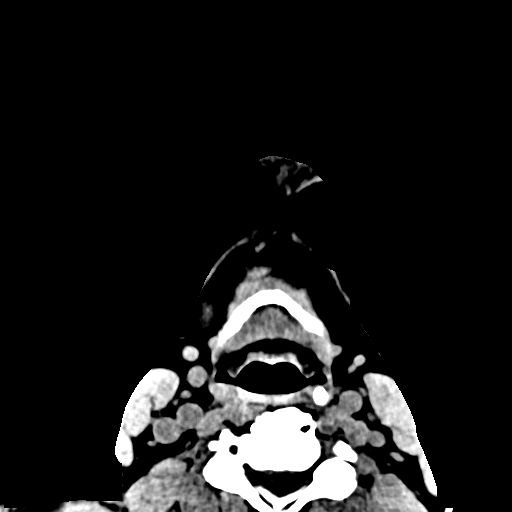
[im 7/86  bone]
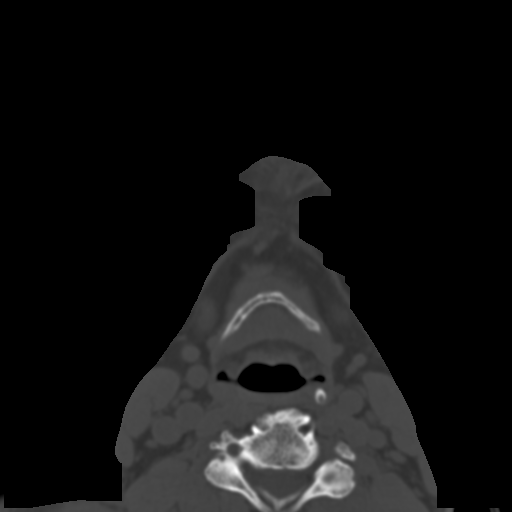
[im 19/86  bone]
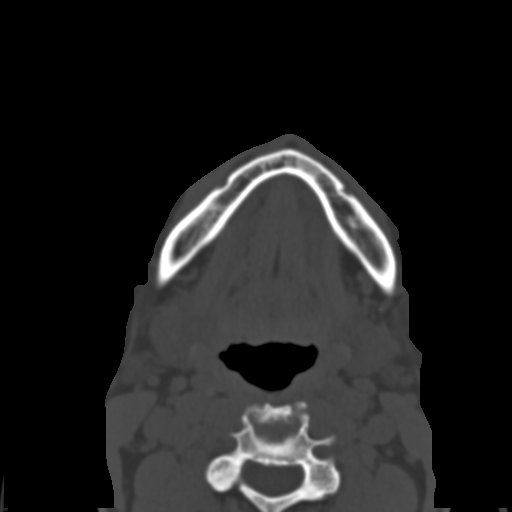
[im 25/86  bone]
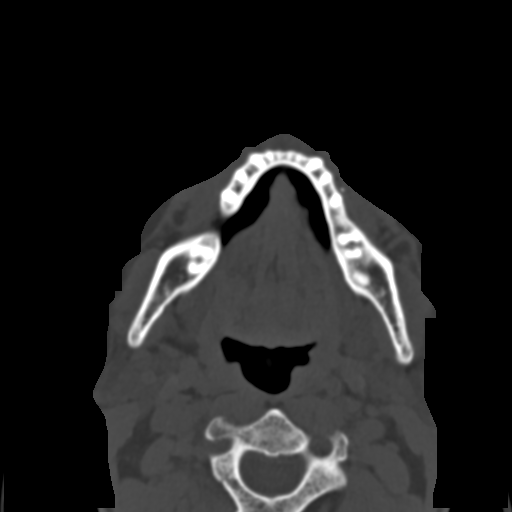
[im 37/86  bone]
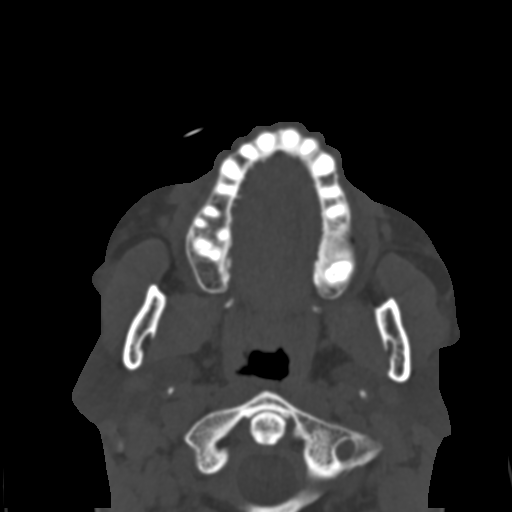
[im 43/86  brain]
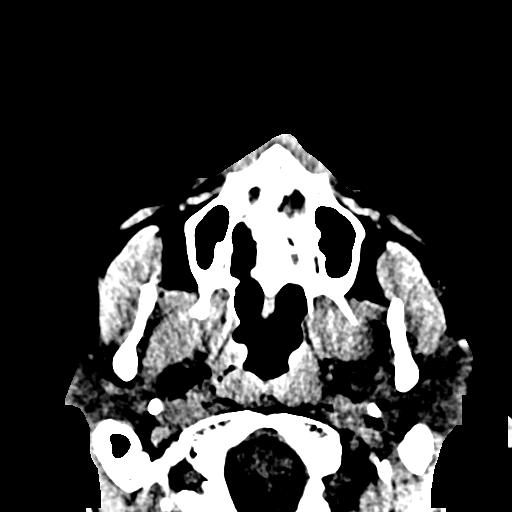
[im 43/86  bone]
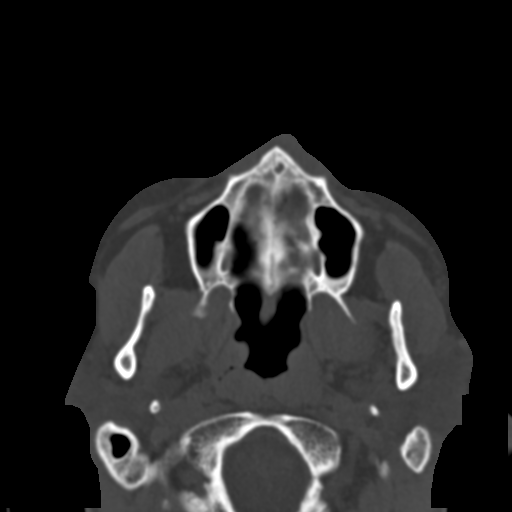
[im 49/86  bone]
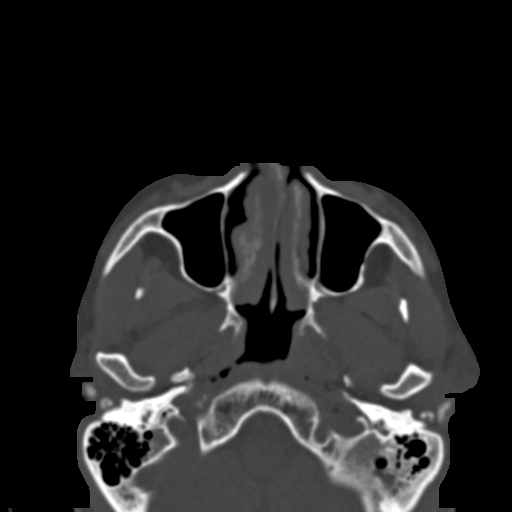
[im 61/86  bone]
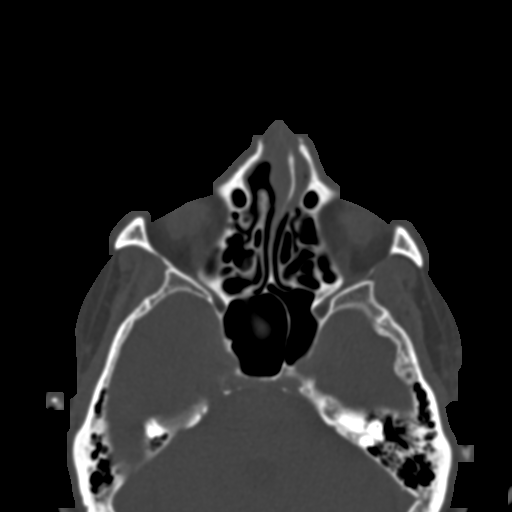
[im 67/86  bone]
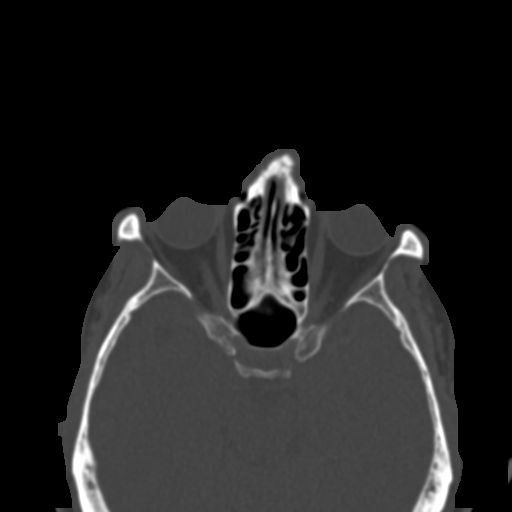
[im 79/86  brain]
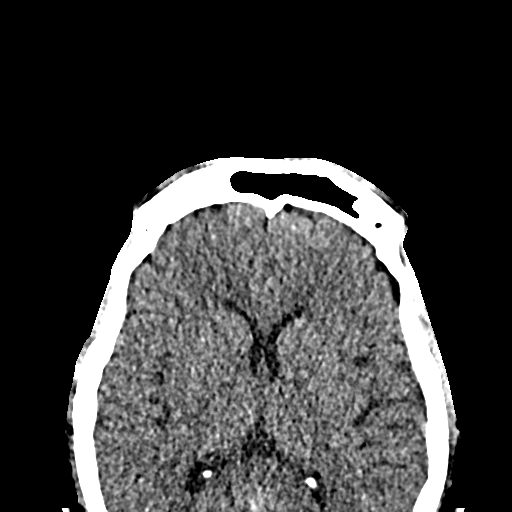
[im 79/86  bone]
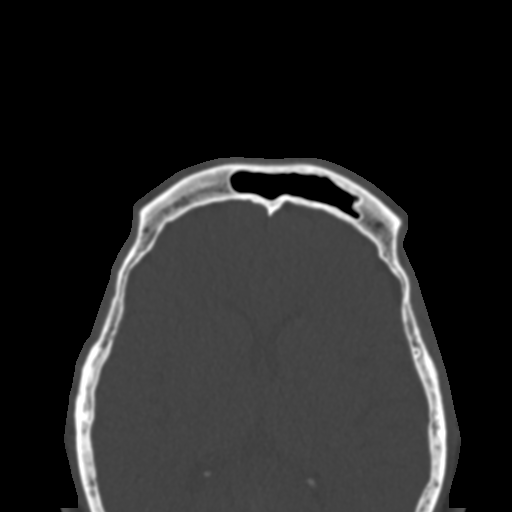

[Series 7: st cor · coronal · 0.35mm/px · 3 of 91 slices shown]
[im 23/91  bone]
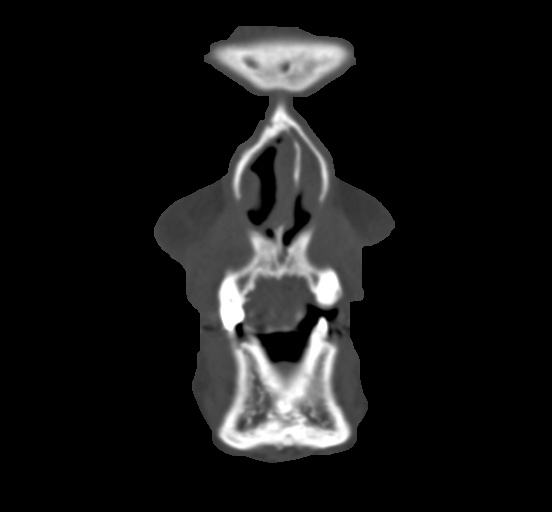
[im 46/91  bone]
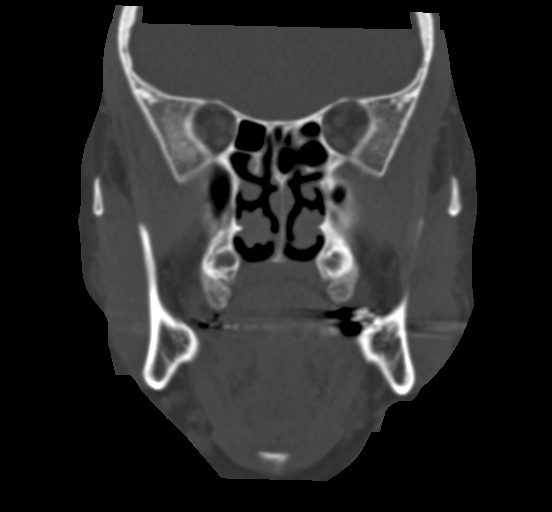
[im 68/91  bone]
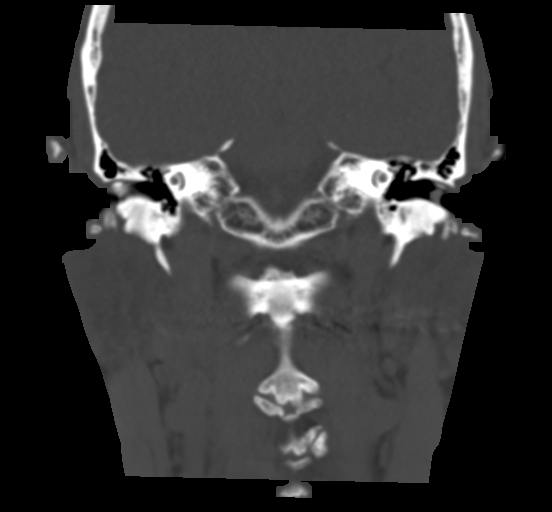

[Series 10: bone sag · sagittal · 0.35mm/px · 2 of 98 slices shown]
[im 33/98  bone]
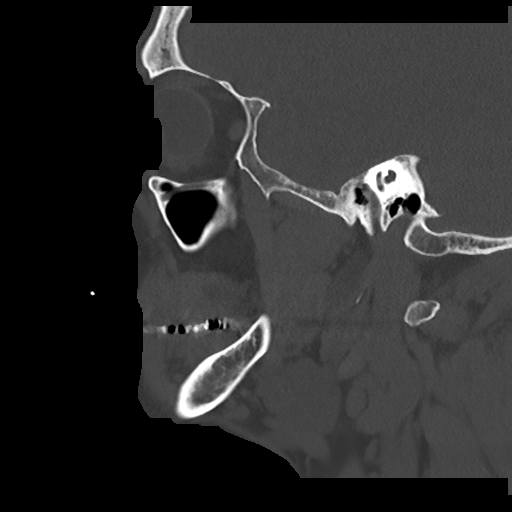
[im 65/98  bone]
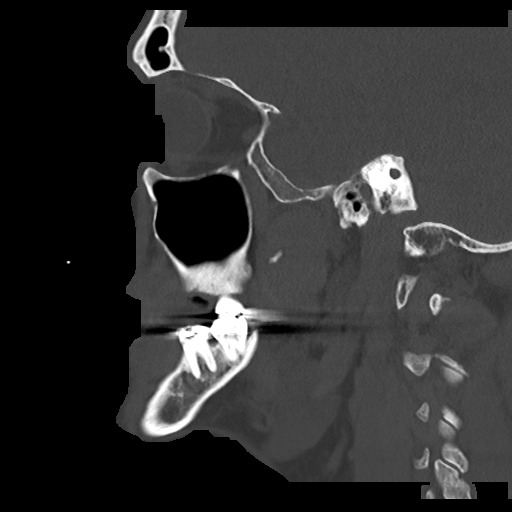

[14 of 47 positions shown; findings below may reference images not displayed]

FINDINGS: CT HEAD FINDINGS

Brain: Ventricles and sulci are normal in size and configuration.
There is no intracranial mass, hemorrhage, extra-axial fluid
collection, or midline shift. Brain parenchyma appears unremarkable.
There is no demonstrable acute infarct.

Vascular: No hyperdense vessels.  No evident vascular calcification.

Skull: Bony calvarium appears intact. There is a small medial right
frontal scalp hematoma.

Other: Mastoid air cells are clear.

CT MAXILLOFACIAL FINDINGS

Osseous: There is a comminuted fracture along the distal nasion as
well fractures of the anterior right and left nasal bones. There is
a minimally displaced fracture of the anterior vomer. No other
fractures are evident. No dislocation. No blastic or lytic bone
lesions. Temporomandibular joints appear symmetric.

Orbits: Orbits appear symmetric bilaterally. No intraorbital lesion
evident.

Sinuses: There is suspected hemorrhage in the anterior ethmoid air
cell regions. There is a retention cyst in the right sphenoid sinus
measuring 2.5 x 1.8 cm. Other paranasal sinuses are clear. No bony
destruction or expansion. No air-fluid levels. Ostiomeatal complexes
are patent bilaterally. There is edema in each inferior nasal
turbinate with inferior nares obstruction bilaterally. Small concha
bullosa on each side, an anatomic variant. Leftward deviation of
anterior nasal septum noted.

Soft tissues: Soft tissue swelling present over nasal and mid face
regions. No well-defined facial hematoma. No abscess. Salivary
glands appear symmetric bilaterally. No adenopathy evident. Tongue
and tongue base regions appear normal. Visualized pharynx appears
normal.

CT CERVICAL SPINE FINDINGS

Alignment: There is no appreciable spondylolisthesis.

Skull base and vertebrae: Skull base and craniocervical junction
regions appear normal. No evident fracture. No blastic or lytic bone
lesions.

Soft tissues and spinal canal: Nuchal ligament calcification is
present posterior to C4 and C5. prevertebral soft tissues and
predental space regions are normal. There is no evident cord or
canal hematoma. There is no paraspinous lesion.

Disc levels: There is moderate disc space narrowing at C3-4, C4-5,
C5-6, and C6-7. There are anterior osteophytes at C3, C4, C5, and
C6. There is facet hypertrophy at multiple levels bilaterally. There
is mild exit foraminal narrowing due to bony hypertrophy at C3-4 on
the left, at C4-5 on the right, at C5-6 bilaterally, and at C6-7
bilaterally. No disc extrusion or stenosis is appreciable.
Uncovertebral spurring is noted at C3, C4, C5, C6, and C7
bilaterally.

Upper chest: Visualized upper lung regions are clear.

Other: None
IMPRESSION: CT head: Small medial right frontal scalp hematoma without
underlying fracture. Brain parenchyma appears normal. No mass or
hemorrhage.

CT maxillofacial:

1. Comminuted anterior nasal fractures. Subtle fracture of the
anterior vomer. No other fractures. No dislocations.

2. Probable hemorrhage in the anterior ethmoid regions. Prominent
retention cyst in right sphenoid sinus. Edema of the inferior nasal
turbinates with obstruction of the inferior nasal cavity medially on
each side. Ostiomeatal unit complexes are patent bilaterally.

3.  No intraorbital lesions.

4. Mid face and paranasal soft tissue swelling. No well-defined
hematoma.

CT cervical spine:

No fracture or spondylolisthesis. Osteoarthritic change at multiple
levels. No disc extrusion or stenosis.

## 2022-05-05 IMAGING — CT CT CERVICAL SPINE W/O CM
3 of 4 series · 11 of 33 positions shown, 13 images · non-contrast
Comparison: None.

CLINICAL DATA: Fall from ladder

EXAM:
CT HEAD WITHOUT CONTRAST
CT MAXILLOFACIAL WITHOUT CONTRAST
CT CERVICAL SPINE WITHOUT CONTRAST
TECHNIQUE: Multidetector CT imaging of the head, cervical spine, and
maxillofacial structures were performed using the standard protocol
without intravenous contrast. Multiplanar CT image reconstructions
of the cervical spine and maxillofacial structures were also
generated.

[Series 8: sag bone · sagittal · 0.30mm/px · 5 of 76 slices shown, 6 images]
[im 26/76  bone]
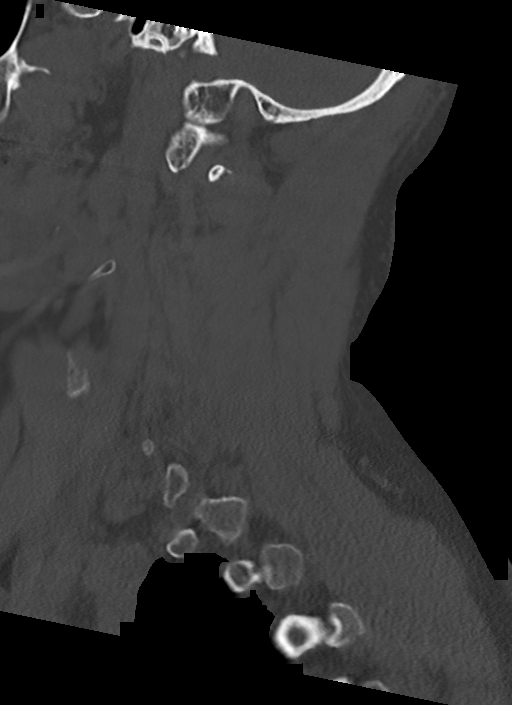
[im 32/76  bone]
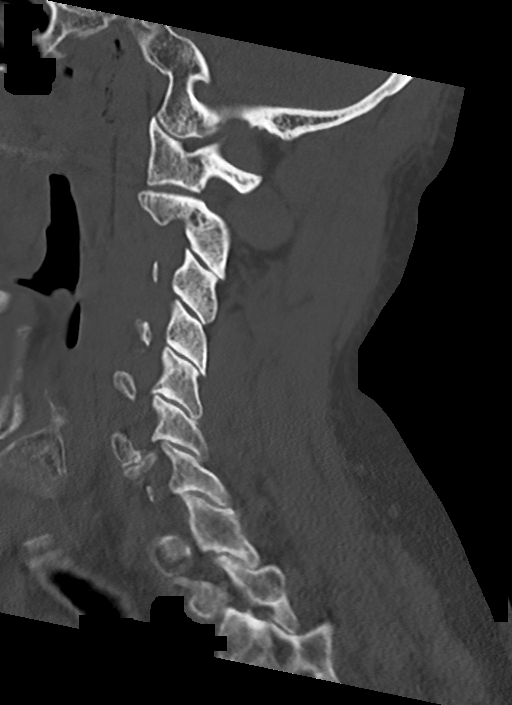
[im 38/76  soft-tissue]
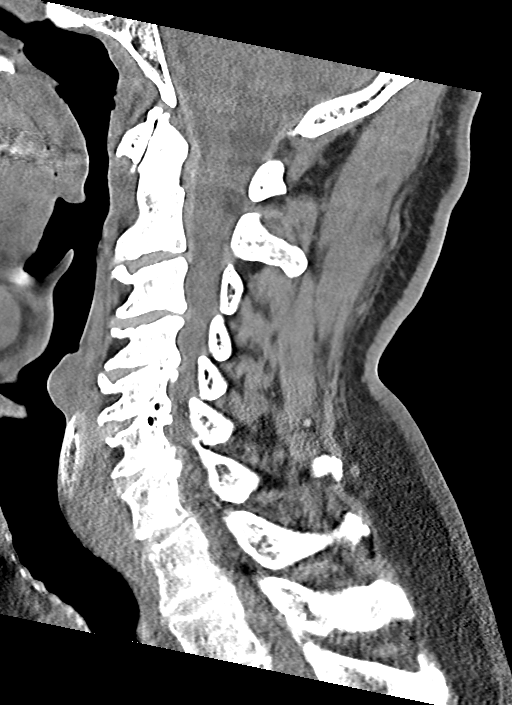
[im 38/76  bone]
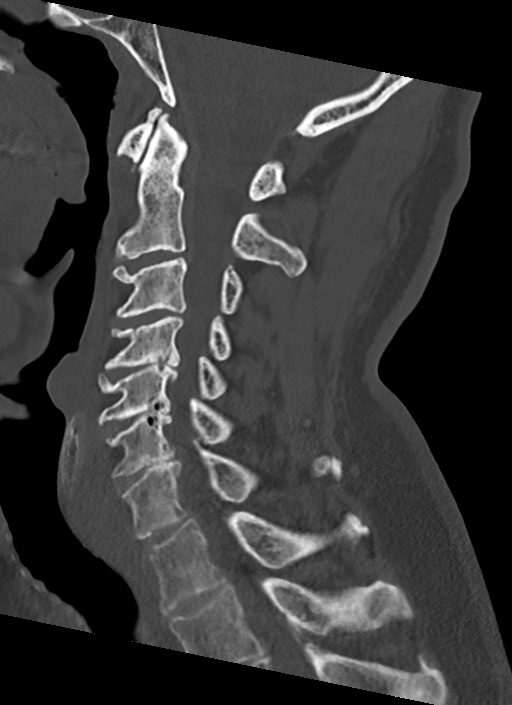
[im 44/76  bone]
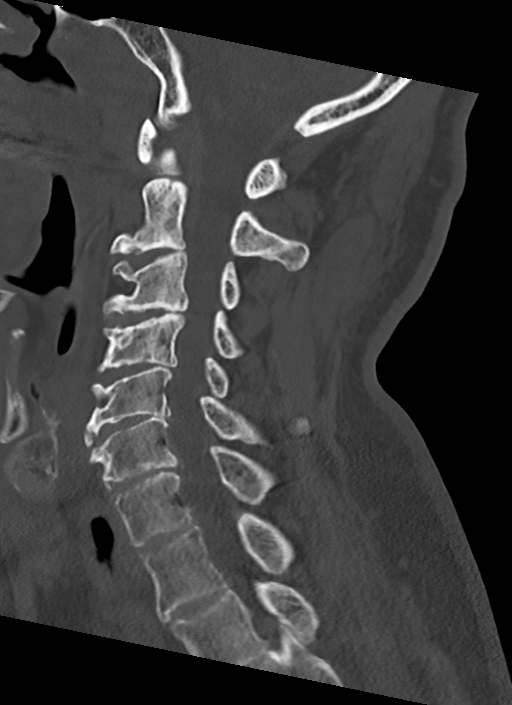
[im 51/76  bone]
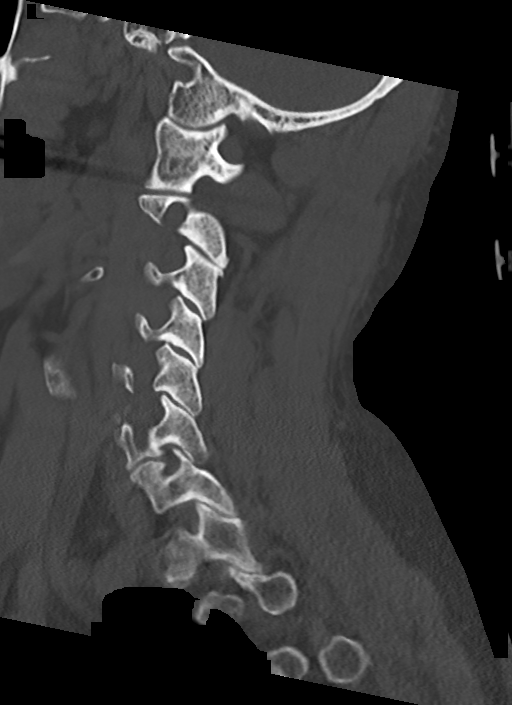

[Series 9: cor bone · coronal · 0.28mm/px · 3 of 66 slices shown]
[im 14/66  bone]
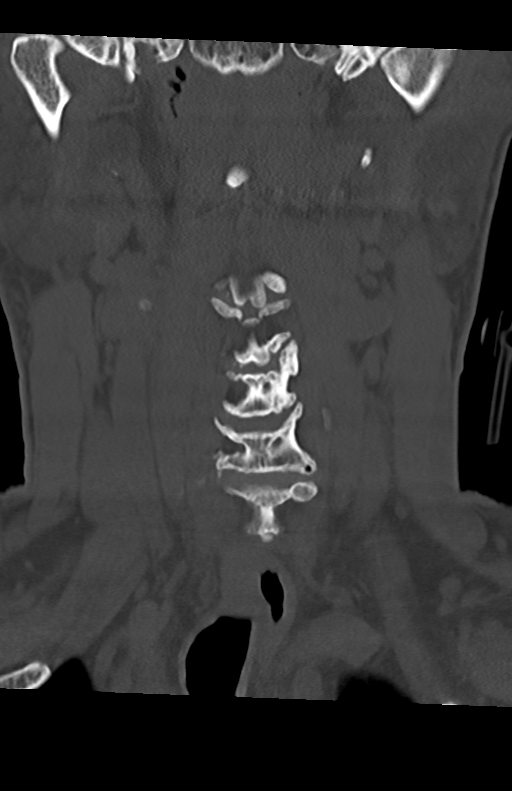
[im 27/66  bone]
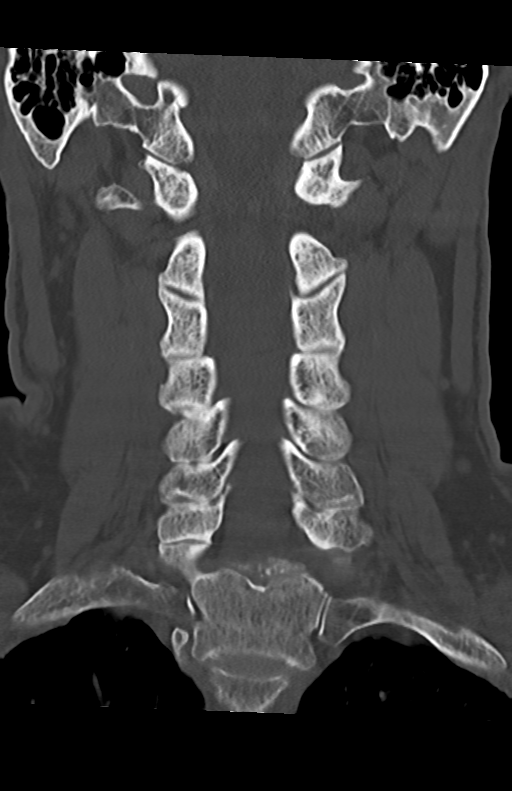
[im 40/66  bone]
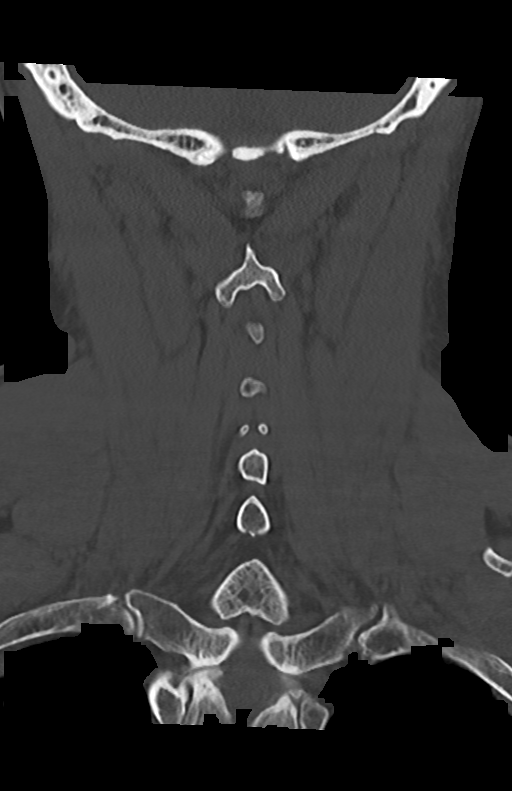

[Series 10: orthogonal axials · axial · 0.21mm/px · z∈[-276,-159]mm · 3 of 93 slices shown, 4 images]
[im 16/93  soft-tissue]
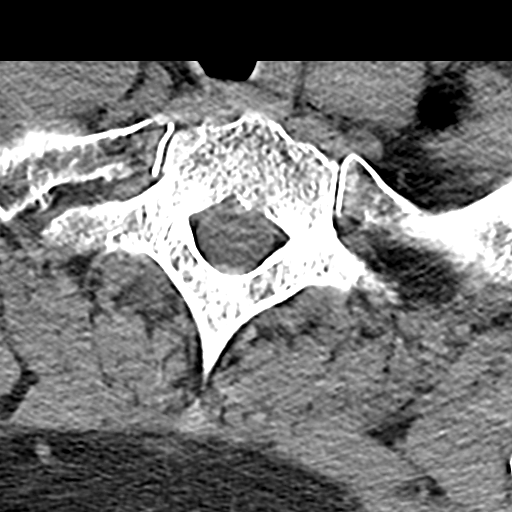
[im 16/93  bone]
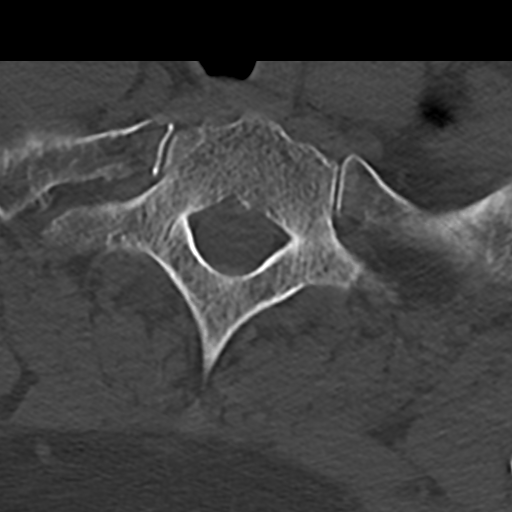
[im 47/93  bone]
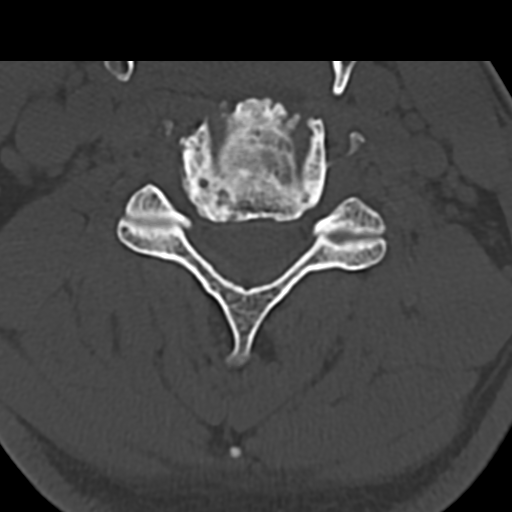
[im 77/93  bone]
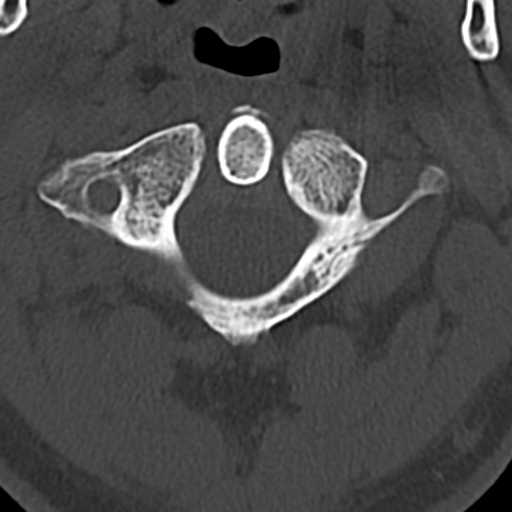

[11 of 33 positions shown; findings below may reference images not displayed]

FINDINGS: CT HEAD FINDINGS

Brain: Ventricles and sulci are normal in size and configuration.
There is no intracranial mass, hemorrhage, extra-axial fluid
collection, or midline shift. Brain parenchyma appears unremarkable.
There is no demonstrable acute infarct.

Vascular: No hyperdense vessels.  No evident vascular calcification.

Skull: Bony calvarium appears intact. There is a small medial right
frontal scalp hematoma.

Other: Mastoid air cells are clear.

CT MAXILLOFACIAL FINDINGS

Osseous: There is a comminuted fracture along the distal nasion as
well fractures of the anterior right and left nasal bones. There is
a minimally displaced fracture of the anterior vomer. No other
fractures are evident. No dislocation. No blastic or lytic bone
lesions. Temporomandibular joints appear symmetric.

Orbits: Orbits appear symmetric bilaterally. No intraorbital lesion
evident.

Sinuses: There is suspected hemorrhage in the anterior ethmoid air
cell regions. There is a retention cyst in the right sphenoid sinus
measuring 2.5 x 1.8 cm. Other paranasal sinuses are clear. No bony
destruction or expansion. No air-fluid levels. Ostiomeatal complexes
are patent bilaterally. There is edema in each inferior nasal
turbinate with inferior nares obstruction bilaterally. Small concha
bullosa on each side, an anatomic variant. Leftward deviation of
anterior nasal septum noted.

Soft tissues: Soft tissue swelling present over nasal and mid face
regions. No well-defined facial hematoma. No abscess. Salivary
glands appear symmetric bilaterally. No adenopathy evident. Tongue
and tongue base regions appear normal. Visualized pharynx appears
normal.

CT CERVICAL SPINE FINDINGS

Alignment: There is no appreciable spondylolisthesis.

Skull base and vertebrae: Skull base and craniocervical junction
regions appear normal. No evident fracture. No blastic or lytic bone
lesions.

Soft tissues and spinal canal: Nuchal ligament calcification is
present posterior to C4 and C5. prevertebral soft tissues and
predental space regions are normal. There is no evident cord or
canal hematoma. There is no paraspinous lesion.

Disc levels: There is moderate disc space narrowing at C3-4, C4-5,
C5-6, and C6-7. There are anterior osteophytes at C3, C4, C5, and
C6. There is facet hypertrophy at multiple levels bilaterally. There
is mild exit foraminal narrowing due to bony hypertrophy at C3-4 on
the left, at C4-5 on the right, at C5-6 bilaterally, and at C6-7
bilaterally. No disc extrusion or stenosis is appreciable.
Uncovertebral spurring is noted at C3, C4, C5, C6, and C7
bilaterally.

Upper chest: Visualized upper lung regions are clear.

Other: None
IMPRESSION: CT head: Small medial right frontal scalp hematoma without
underlying fracture. Brain parenchyma appears normal. No mass or
hemorrhage.

CT maxillofacial:

1. Comminuted anterior nasal fractures. Subtle fracture of the
anterior vomer. No other fractures. No dislocations.

2. Probable hemorrhage in the anterior ethmoid regions. Prominent
retention cyst in right sphenoid sinus. Edema of the inferior nasal
turbinates with obstruction of the inferior nasal cavity medially on
each side. Ostiomeatal unit complexes are patent bilaterally.

3.  No intraorbital lesions.

4. Mid face and paranasal soft tissue swelling. No well-defined
hematoma.

CT cervical spine:

No fracture or spondylolisthesis. Osteoarthritic change at multiple
levels. No disc extrusion or stenosis.

## 2022-05-05 IMAGING — DX DG CHEST 1V PORT
1 series · 1 of 1 positions shown · non-contrast
Comparison: June 05, 2019.

CLINICAL DATA: Pain following fall from ladder

EXAM:
PORTABLE CHEST 1 VIEW

[chest ap]
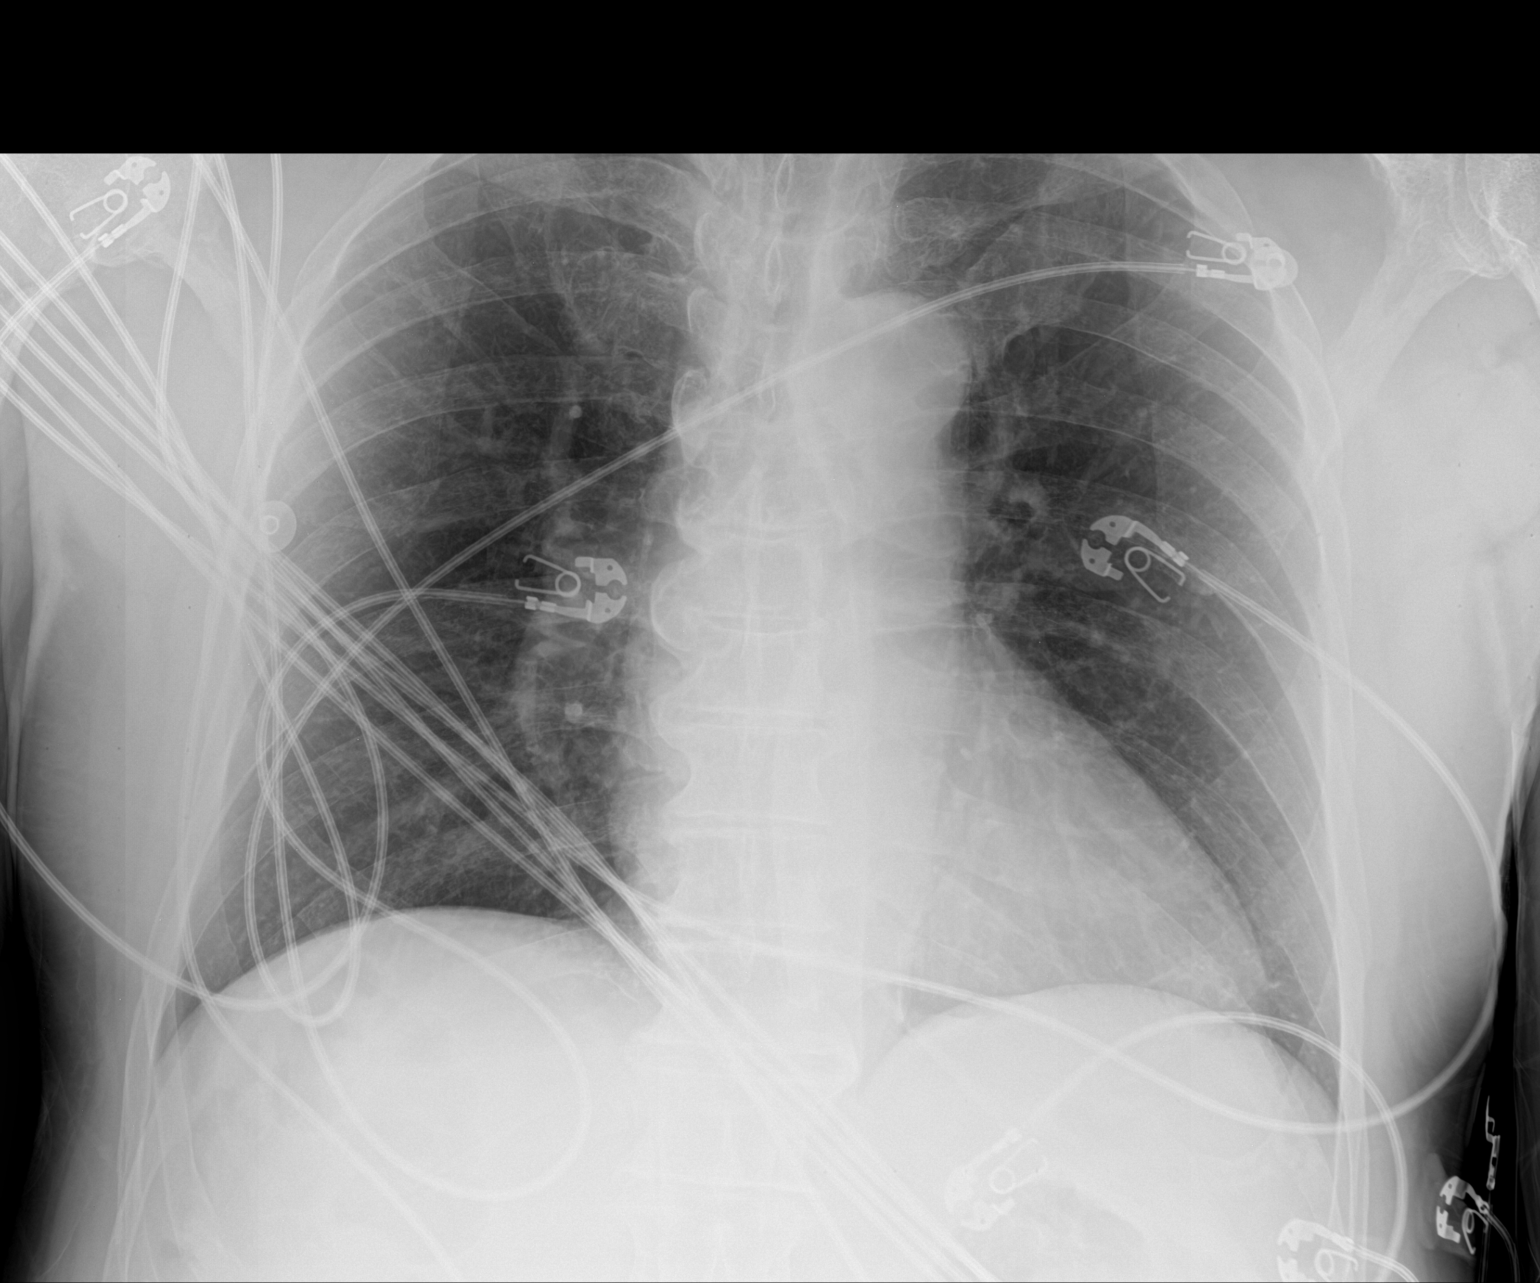

[1 of 1 positions shown; findings below may reference images not displayed]

FINDINGS: Lungs are clear. Heart size and pulmonary vascularity are normal. No
adenopathy. No pneumothorax. There is degenerative change in the
thoracic spine.
IMPRESSION: Lungs clear.  Cardiac silhouette normal.  No pneumothorax.

## 2022-05-06 IMAGING — RF DG C-ARM 1-60 MIN
1 series · 5 of 5 positions shown · non-contrast
Comparison: CT 12/10/2019.

CLINICAL DATA: ORIF

EXAM:
LEFT KNEE - COMPLETE 4+ VIEW; DG C-ARM 1-60 MIN

[Series 1: run · 5 of 5 slices shown]
[im 1/5]
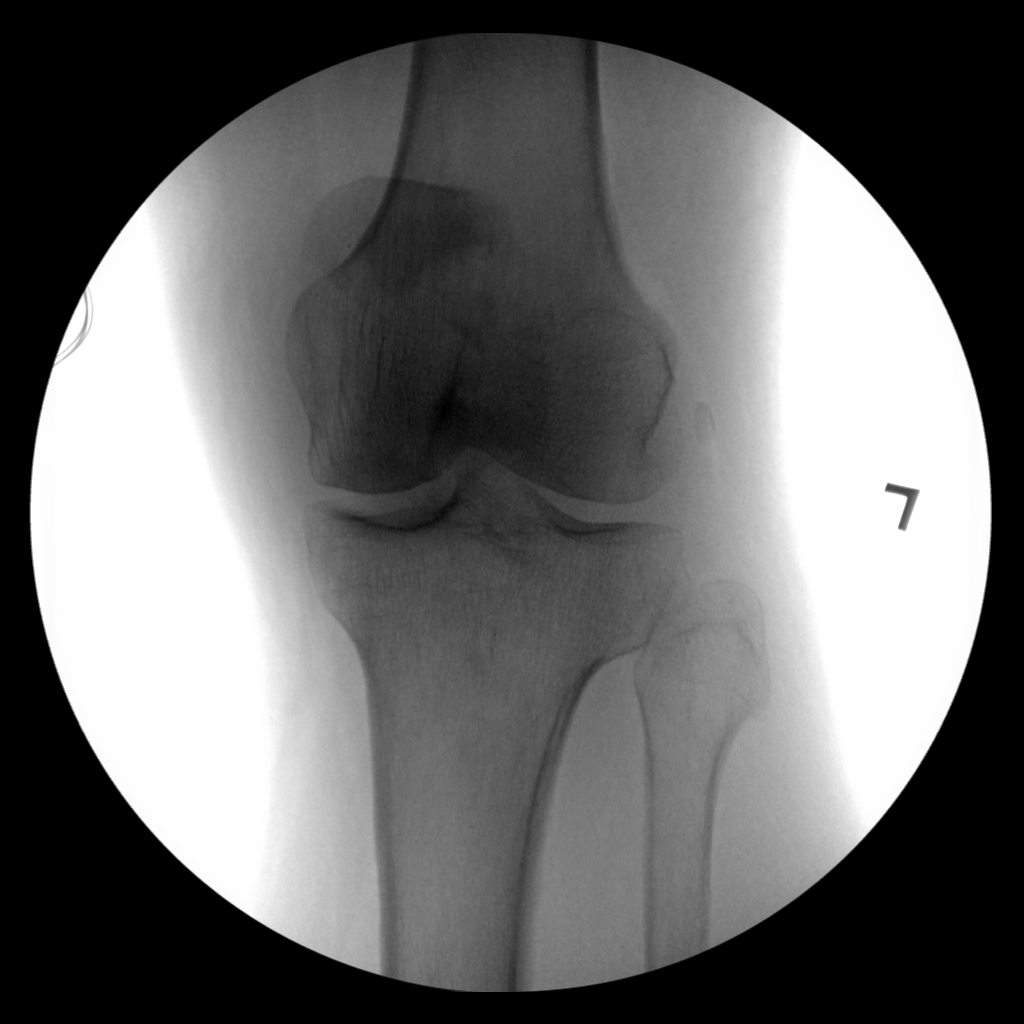
[im 2/5]
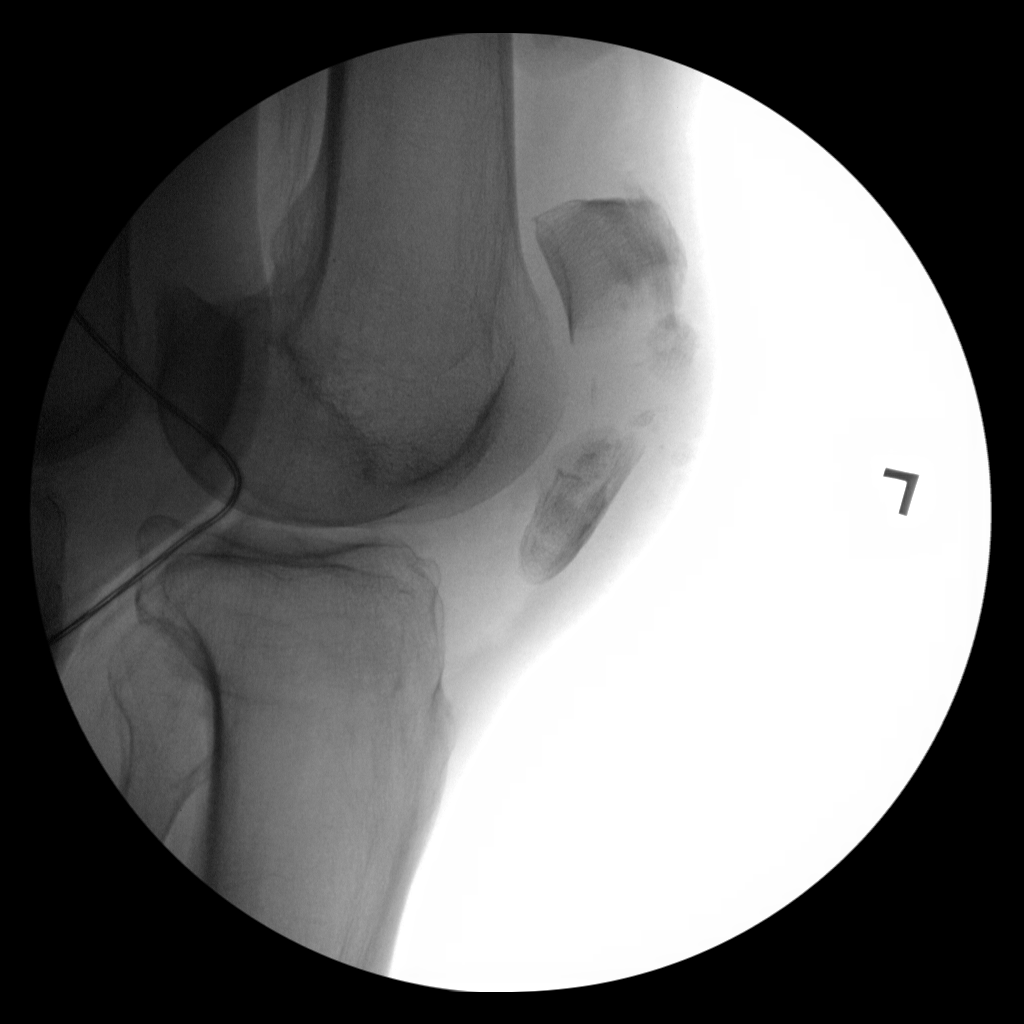
[im 3/5]
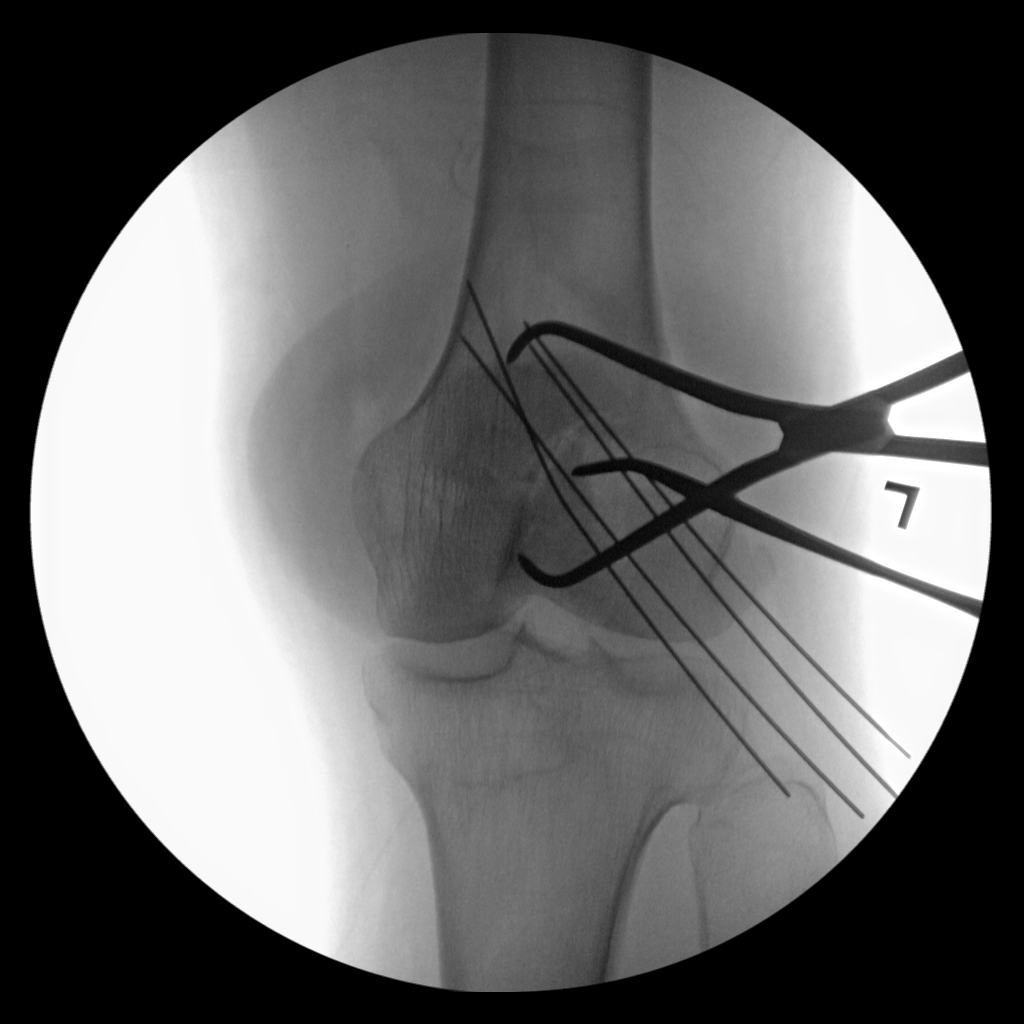
[im 4/5]
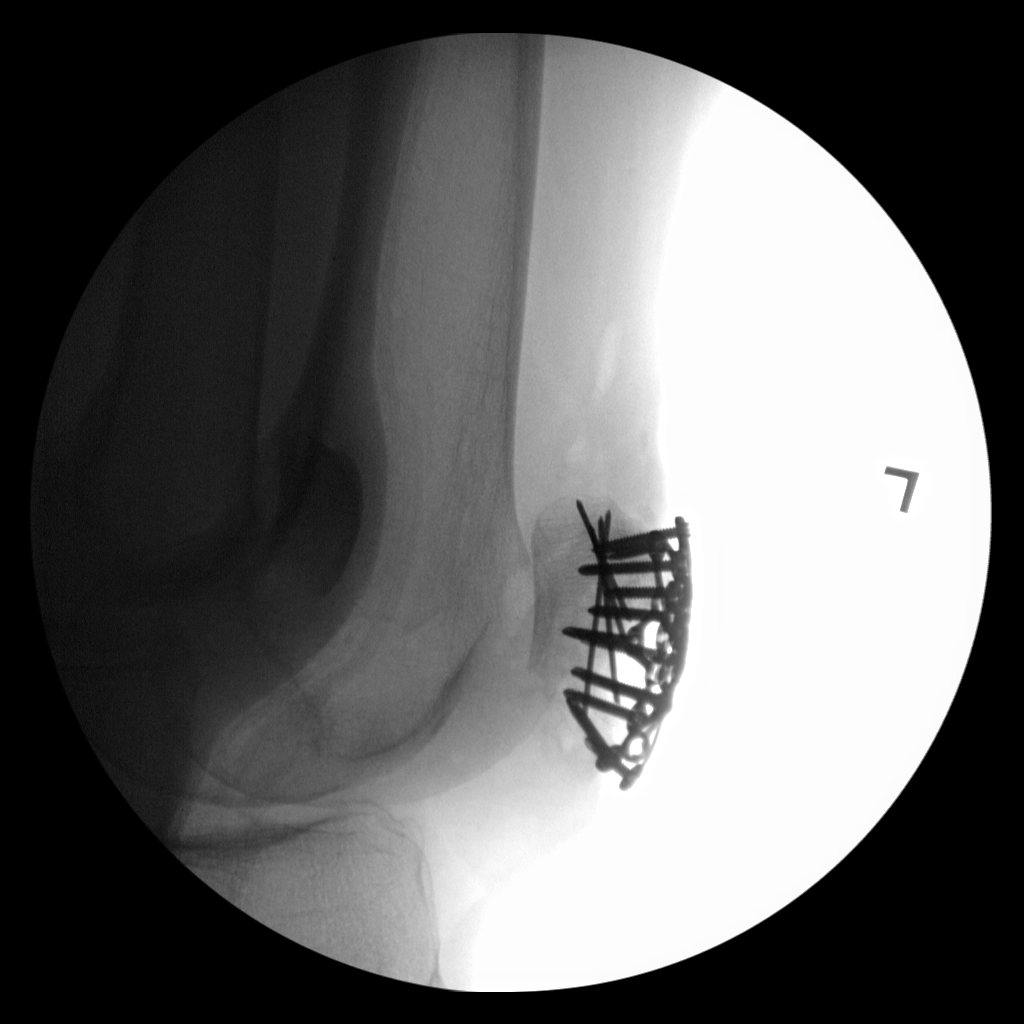
[im 5/5]
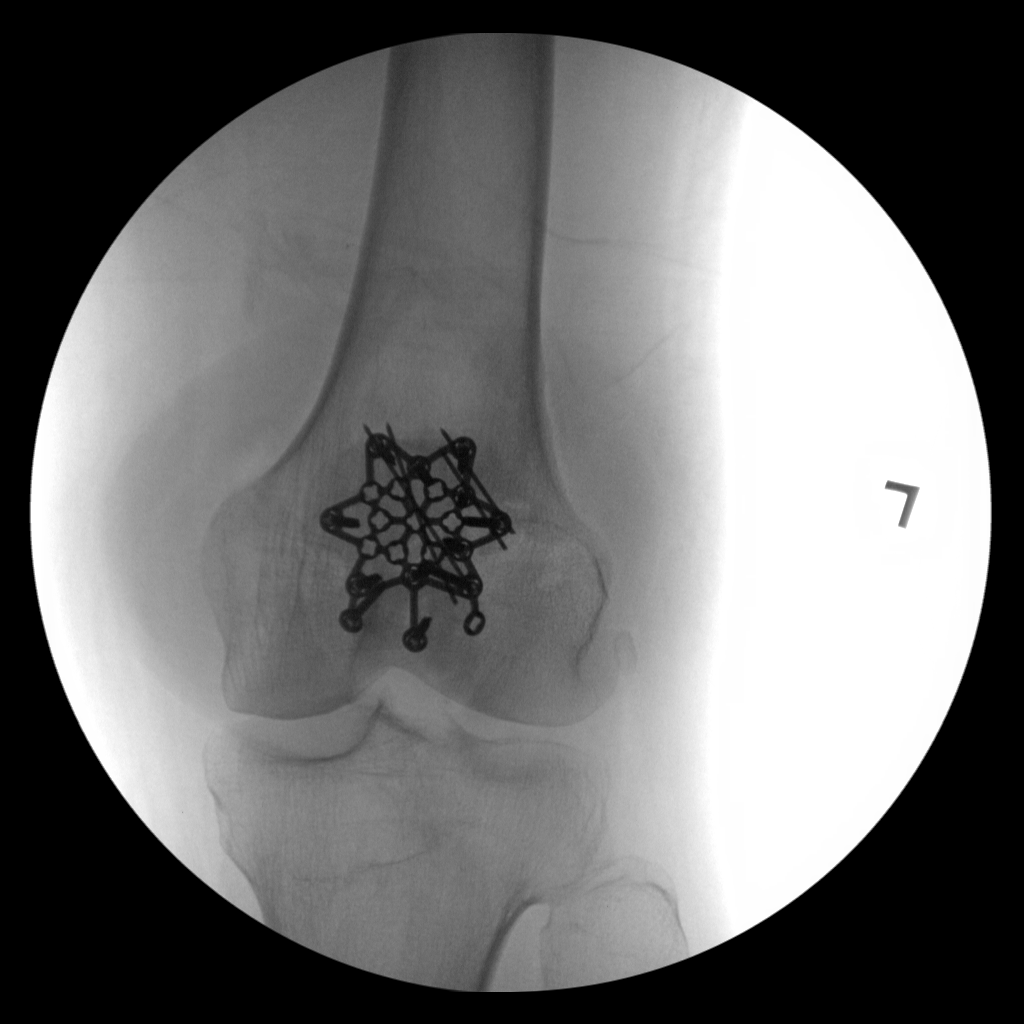

[5 of 5 positions shown; findings below may reference images not displayed]

FINDINGS: ORIF left patella.  Hardware intact.  Anatomic alignment.
IMPRESSION: ORIF left patella. Anatomic alignment.

## 2022-05-19 DIAGNOSIS — E0590 Thyrotoxicosis, unspecified without thyrotoxic crisis or storm: Secondary | ICD-10-CM | POA: Diagnosis not present

## 2022-05-19 DIAGNOSIS — I10 Essential (primary) hypertension: Secondary | ICD-10-CM | POA: Diagnosis not present

## 2022-05-19 DIAGNOSIS — R3121 Asymptomatic microscopic hematuria: Secondary | ICD-10-CM | POA: Diagnosis not present

## 2022-05-19 DIAGNOSIS — R350 Frequency of micturition: Secondary | ICD-10-CM | POA: Diagnosis not present

## 2022-05-19 DIAGNOSIS — K649 Unspecified hemorrhoids: Secondary | ICD-10-CM | POA: Diagnosis not present

## 2022-05-19 DIAGNOSIS — R309 Painful micturition, unspecified: Secondary | ICD-10-CM | POA: Diagnosis not present

## 2022-05-19 DIAGNOSIS — E785 Hyperlipidemia, unspecified: Secondary | ICD-10-CM | POA: Diagnosis not present

## 2022-06-04 ENCOUNTER — Ambulatory Visit: Payer: Medicare HMO | Admitting: Family Medicine

## 2022-06-13 ENCOUNTER — Encounter: Payer: Self-pay | Admitting: Family Medicine

## 2022-06-13 ENCOUNTER — Ambulatory Visit (INDEPENDENT_AMBULATORY_CARE_PROVIDER_SITE_OTHER): Payer: Medicare HMO | Admitting: Family Medicine

## 2022-06-13 VITALS — BP 127/71 | HR 65 | Ht 70.0 in | Wt 177.0 lb

## 2022-06-13 DIAGNOSIS — M1711 Unilateral primary osteoarthritis, right knee: Secondary | ICD-10-CM

## 2022-06-13 DIAGNOSIS — N3281 Overactive bladder: Secondary | ICD-10-CM | POA: Diagnosis not present

## 2022-06-13 DIAGNOSIS — E890 Postprocedural hypothyroidism: Secondary | ICD-10-CM | POA: Diagnosis not present

## 2022-06-13 DIAGNOSIS — E782 Mixed hyperlipidemia: Secondary | ICD-10-CM | POA: Diagnosis not present

## 2022-06-13 DIAGNOSIS — Z125 Encounter for screening for malignant neoplasm of prostate: Secondary | ICD-10-CM

## 2022-06-13 LAB — PSA, TOTAL AND FREE

## 2022-06-13 LAB — TSH

## 2022-06-13 MED ORDER — DICLOFENAC SODIUM 75 MG PO TBEC
75.0000 mg | DELAYED_RELEASE_TABLET | Freq: Two times a day (BID) | ORAL | 3 refills | Status: DC
Start: 2022-06-13 — End: 2022-12-17

## 2022-06-13 NOTE — Progress Notes (Signed)
BP 127/71   Pulse 65   Ht 5\' 10"  (1.778 m)   Wt 177 lb (80.3 kg)   SpO2 98%   BMI 25.40 kg/m    Subjective:   Patient ID: Robert [name], male    DOB: 1955-07-19, 67 y.o.   MRN: [id number]  HPI: Robert [name] is a 67 y.o. male presenting on 06/13/2022 for Medical Management of Chronic Issues, Hyperlipidemia, and Hypothyroidism   HPI Hypothyroidism recheck Patient is coming in for thyroid recheck today as well. They deny any issues with hair changes or heat or cold problems or diarrhea or constipation. They deny any chest pain or palpitations. They are currently on levothyroxine 125 micrograms   Hyperlipidemia Patient is coming in for recheck of his hyperlipidemia. The patient is currently taking atorvastatin. They deny any issues with myalgias or history of liver damage from it. They deny any focal numbness or weakness or chest pain.   Osteoarthritis knee recheck Patient takes diclofenac to help with left extremity sugars reviewed foods to help.  Denies any major issues with it.  Relevant past medical, surgical, family and social history reviewed and updated as indicated. Interim medical history since our last visit reviewed. Allergies and medications reviewed and updated.  Review of Systems  Constitutional:  Negative for chills and fever.  Eyes:  Negative for visual disturbance.  Respiratory:  Negative for shortness of breath and wheezing.   Cardiovascular:  Negative for chest pain and leg swelling.  Musculoskeletal:  Negative for back pain and gait problem.  Skin:  Negative for rash.  Neurological:  Negative for dizziness, weakness and light-headedness.  All other systems reviewed and are negative.   Per HPI unless specifically indicated above   Allergies as of 06/13/2022   No Known Allergies      Medication List        Accurate as of Jun 13, 2022 11:25 AM. If you have any questions, ask your nurse or doctor.          acetaminophen 325 MG tablet Commonly  known as: TYLENOL Take 650 mg by mouth every 6 (six) hours as needed for moderate pain.   atorvastatin 20 MG tablet Commonly known as: LIPITOR Take 1 tablet (20 mg total) by mouth daily at 4 PM.   diclofenac 75 MG EC tablet Commonly known as: VOLTAREN Take 1 tablet (75 mg total) by mouth 2 (two) times daily.   fluticasone 50 MCG/ACT nasal spray Commonly known as: FLONASE Place 2 sprays into both nostrils daily.   ibuprofen 200 MG tablet Commonly known as: ADVIL Take 200 mg by mouth every 6 (six) hours as needed for headache or mild pain.   levocetirizine 5 MG tablet Commonly known as: XYZAL Take 1 tablet (5 mg total) by mouth every evening.   levothyroxine 125 MCG tablet Commonly known as: SYNTHROID Take 1 tablet (125 mcg total) by mouth daily.   lisinopril 10 MG tablet Commonly known as: ZESTRIL Take 10 mg by mouth daily.   montelukast 10 MG tablet Commonly known as: SINGULAIR Take 1 tablet (10 mg total) by mouth at bedtime.   nystatin cream Commonly known as: MYCOSTATIN Apply 1 Application topically 2 (two) times daily.   ONE-A-DAY MENS 50+ ADVANTAGE PO Take 1 tablet by mouth daily.         Objective:   BP 127/71   Pulse 65   Ht 5\' 10"  (1.778 m)   Wt 177 lb (80.3 kg)   SpO2 98%   BMI 25.40  kg/m   Wt Readings from Last 3 Encounters:  06/13/22 177 lb (80.3 kg)  12/04/21 181 lb (82.1 kg)  06/15/21 191 lb (86.6 kg)    Physical Exam Vitals and nursing note reviewed.  Constitutional:      General: He is not in acute distress.    Appearance: He is well-developed. He is not diaphoretic.  Eyes:     General: No scleral icterus.    Conjunctiva/sclera: Conjunctivae normal.  Neck:     Thyroid: No thyromegaly.  Cardiovascular:     Rate and Rhythm: Normal rate and regular rhythm.     Heart sounds: Normal heart sounds. No murmur heard. Pulmonary:     Effort: Pulmonary effort is normal. No respiratory distress.     Breath sounds: Normal breath sounds. No  wheezing.  Genitourinary:    Prostate: Enlarged. Not tender and no nodules present.     Rectum: External hemorrhoid (Small external hemorrhoid, no bleeding) present.  Musculoskeletal:        General: No swelling. Normal range of motion.     Cervical back: Neck supple.  Lymphadenopathy:     Cervical: No cervical adenopathy.  Skin:    General: Skin is warm and dry.     Findings: No rash.  Neurological:     Mental Status: He is alert and oriented to person, place, and time.     Coordination: Coordination normal.  Psychiatric:        Behavior: Behavior normal.       Assessment & Plan:   Problem List Items Addressed This Visit       Endocrine   Hypothyroidism following radioiodine therapy - Primary   Relevant Orders   CBC with Differential/Platelet   TSH     Other   Hyperlipidemia   Relevant Medications   lisinopril (ZESTRIL) 10 MG tablet   Other Relevant Orders   CBC with Differential/Platelet   CMP14+EGFR   Lipid panel   Other Visit Diagnoses     Primary osteoarthritis of right knee       Relevant Medications   diclofenac (VOLTAREN) 75 MG EC tablet   Prostate cancer screening       Relevant Orders   PSA, total and free   OAB (overactive bladder)           Will check blood work today.  On exam blood pressure and everything looks good.  No need to do lisinopril because been off of it for more than a weeks diabetes doing well.  Patient from urgent care was diagnosed with possible OAB and was given a sample for Myrbetriq, he has not started it or tried it yet, wanted to ask first but he will go ahead and try it for the next 2 weeks. Follow up plan: Return in about 6 months (around 12/14/2022), or if symptoms worsen or fail to improve, for Hypothyroidism and hyperlipidemia.  Counseling provided for all of the vaccine components Orders Placed This Encounter  Procedures   CBC with Differential/Platelet   CMP14+EGFR   Lipid panel   PSA, total and free   TSH     Arville Care, MD [facility] 06/13/2022, 11:25 AM

## 2022-06-14 LAB — LIPID PANEL
Chol/HDL Ratio: 3.2 ratio (ref 0.0–5.0)
Cholesterol, Total: 177 mg/dL (ref 100–199)
HDL: 55 mg/dL (ref >39)
LDL Chol Calc (NIH): 109 mg/dL — ABNORMAL HIGH (ref 0–99)
Triglycerides: 71 mg/dL (ref 0–149)
VLDL Cholesterol Cal: 13 mg/dL (ref 5–40)

## 2022-06-14 LAB — CBC WITH DIFFERENTIAL/PLATELET
Basophils Absolute: 0 10*3/uL (ref 0.0–0.2)
Basos: 0 %
EOS (ABSOLUTE): 0 10*3/uL (ref 0.0–0.4)
Eos: 1 %
Hematocrit: 36.2 % — ABNORMAL LOW (ref 37.5–51.0)
Hemoglobin: 12.1 g/dL — ABNORMAL LOW (ref 13.0–17.7)
Immature Grans (Abs): 0 10*3/uL (ref 0.0–0.1)
Immature Granulocytes: 0 %
Lymphocytes Absolute: 0.7 10*3/uL (ref 0.7–3.1)
Lymphs: 23 %
MCH: 31.8 pg (ref 26.6–33.0)
MCHC: 33.4 g/dL (ref 31.5–35.7)
MCV: 95 fL (ref 79–97)
Monocytes Absolute: 0.2 10*3/uL (ref 0.1–0.9)
Monocytes: 7 %
Neutrophils Absolute: 2 10*3/uL (ref 1.4–7.0)
Neutrophils: 69 %
Platelets: 196 10*3/uL (ref 150–450)
RBC: 3.8 x10E6/uL — ABNORMAL LOW (ref 4.14–5.80)
RDW: 13 % (ref 11.6–15.4)
WBC: 3 10*3/uL — ABNORMAL LOW (ref 3.4–10.8)

## 2022-06-14 LAB — CMP14+EGFR
ALT: 14 IU/L (ref 0–44)
AST: 23 IU/L (ref 0–40)
Albumin/Globulin Ratio: 1.3 (ref 1.2–2.2)
Albumin: 4 g/dL (ref 3.9–4.9)
Alkaline Phosphatase: 62 IU/L (ref 44–121)
BUN/Creatinine Ratio: 10 (ref 10–24)
BUN: 10 mg/dL (ref 8–27)
Bilirubin Total: 0.4 mg/dL (ref 0.0–1.2)
CO2: 26 mmol/L (ref 20–29)
Calcium: 8.8 mg/dL (ref 8.6–10.2)
Chloride: 101 mmol/L (ref 96–106)
Creatinine, Ser: 1.05 mg/dL (ref 0.76–1.27)
Globulin, Total: 3.1 g/dL (ref 1.5–4.5)
Glucose: 82 mg/dL (ref 70–99)
Potassium: 4.1 mmol/L (ref 3.5–5.2)
Sodium: 139 mmol/L (ref 134–144)
Total Protein: 7.1 g/dL (ref 6.0–8.5)
eGFR: 77 mL/min/{1.73_m2} (ref >59)

## 2022-06-14 LAB — PSA, TOTAL AND FREE: Prostate Specific Ag, Serum: 3.3 ng/mL (ref 0.0–4.0)

## 2022-06-19 DIAGNOSIS — K648 Other hemorrhoids: Secondary | ICD-10-CM | POA: Diagnosis not present

## 2022-06-19 DIAGNOSIS — Z79890 Hormone replacement therapy (postmenopausal): Secondary | ICD-10-CM | POA: Diagnosis not present

## 2022-06-19 DIAGNOSIS — E0590 Thyrotoxicosis, unspecified without thyrotoxic crisis or storm: Secondary | ICD-10-CM | POA: Diagnosis not present

## 2022-06-19 DIAGNOSIS — R051 Acute cough: Secondary | ICD-10-CM | POA: Diagnosis not present

## 2022-06-19 DIAGNOSIS — E785 Hyperlipidemia, unspecified: Secondary | ICD-10-CM | POA: Diagnosis not present

## 2022-06-19 DIAGNOSIS — R059 Cough, unspecified: Secondary | ICD-10-CM | POA: Diagnosis not present

## 2022-06-19 DIAGNOSIS — Z79899 Other long term (current) drug therapy: Secondary | ICD-10-CM | POA: Diagnosis not present

## 2022-06-20 ENCOUNTER — Other Ambulatory Visit: Payer: Self-pay

## 2022-06-20 MED ORDER — LEVOTHYROXINE SODIUM 137 MCG PO TABS: 137.0000 ug | ORAL_TABLET | Freq: Every day | ORAL | 1 refills | Status: DC

## 2022-06-27 ENCOUNTER — Encounter: Payer: Self-pay | Admitting: Family Medicine

## 2022-06-27 ENCOUNTER — Ambulatory Visit (INDEPENDENT_AMBULATORY_CARE_PROVIDER_SITE_OTHER): Payer: Medicare HMO | Admitting: Family Medicine

## 2022-06-27 VITALS — BP 133/65 | HR 65 | Ht 70.0 in | Wt 176.0 lb

## 2022-06-27 DIAGNOSIS — R6889 Other general symptoms and signs: Secondary | ICD-10-CM

## 2022-06-27 DIAGNOSIS — R1013 Epigastric pain: Secondary | ICD-10-CM

## 2022-06-27 MED ORDER — ONDANSETRON 4 MG PO TBDP
4.0000 mg | ORAL_TABLET | Freq: Three times a day (TID) | ORAL | 0 refills | Status: DC | PRN
Start: 2022-06-27 — End: 2022-12-17

## 2022-06-27 MED ORDER — SUCRALFATE 1 G PO TABS
1.0000 g | ORAL_TABLET | Freq: Three times a day (TID) | ORAL | 0 refills | Status: DC
Start: 2022-06-27 — End: 2022-12-17

## 2022-06-27 MED ORDER — PANTOPRAZOLE SODIUM 40 MG PO TBEC: 40.0000 mg | DELAYED_RELEASE_TABLET | Freq: Every day | ORAL | 1 refills | Status: DC

## 2022-06-27 NOTE — Progress Notes (Signed)
BP 133/65   Pulse 65   Ht 5\' 10"  (1.778 m)   Wt 176 lb (79.8 kg)   SpO2 95%   BMI 25.25 kg/m    Subjective:   Patient ID: Robert [name], male    DOB: [birth year], 67 y.o.   MRN: [id number]  HPI: Robert [name] is a 67 y.o. male presenting on 06/27/2022 for Constipation and Nausea   HPI Patient is coming in today because been feeling well has been constipated for couple days and has been developing dizziness and nausea over the past days.  He does complain of some upper abdominal pain and just generally feeling ill.  He says he has not tried his MiraLAX or his Linzess to help with bowel movements.  He says he has been complaining of paranoid water and trying to stay hydrated but just is not feeling well.  He did start a new medicine in the Voltaren not too long ago and he does admit that he has not been taking it regularly with food.  The diarrhea or blood in his stool or blood in his vomit.  He has vomited once or twice.  He did use the ondansetron that he had to help with the vomiting.  Relevant past medical, surgical, family and social history reviewed and updated as indicated. Interim medical history since our last visit reviewed. Allergies and medications reviewed and updated.  Review of Systems  Constitutional:  Positive for appetite change. Negative for chills and fever.  Eyes:  Negative for visual disturbance.  Respiratory:  Negative for shortness of breath and wheezing.   Cardiovascular:  Negative for chest pain and leg swelling.  Gastrointestinal:  Positive for abdominal pain, constipation, nausea and vomiting.  Musculoskeletal:  Negative for back pain and gait problem.  Skin:  Negative for rash.  Neurological:  Negative for dizziness and light-headedness.  All other systems reviewed and are negative.   Per HPI unless specifically indicated above   Allergies as of 06/27/2022   No Known Allergies      Medication List        Accurate as of June 27, 2022  3:49 PM. If you  have any questions, ask your nurse or doctor.          acetaminophen 325 MG tablet Commonly known as: TYLENOL Take 650 mg by mouth every 6 (six) hours as needed for moderate pain.   atorvastatin 20 MG tablet Commonly known as: LIPITOR Take 1 tablet (20 mg total) by mouth daily at 4 PM.   diclofenac 75 MG EC tablet Commonly known as: VOLTAREN Take 1 tablet (75 mg total) by mouth 2 (two) times daily.   fluticasone 50 MCG/ACT nasal spray Commonly known as: FLONASE Place 2 sprays into both nostrils daily.   ibuprofen 200 MG tablet Commonly known as: ADVIL Take 200 mg by mouth every 6 (six) hours as needed for headache or mild pain.   levocetirizine 5 MG tablet Commonly known as: XYZAL Take 1 tablet (5 mg total) by mouth every evening.   levothyroxine 137 MCG tablet Commonly known as: SYNTHROID Take 1 tablet (137 mcg total) by mouth daily before breakfast.   lisinopril 10 MG tablet Commonly known as: ZESTRIL Take 10 mg by mouth daily.   montelukast 10 MG tablet Commonly known as: SINGULAIR Take 1 tablet (10 mg total) by mouth at bedtime.   nystatin cream Commonly known as: MYCOSTATIN Apply 1 Application topically 2 (two) times daily.   ondansetron 4 MG disintegrating tablet Commonly  known as: ZOFRAN-ODT Take 1 tablet (4 mg total) by mouth every 8 (eight) hours as needed for nausea or vomiting. Started by: Nils Pyle, MD   ONE-A-DAY MENS 50+ ADVANTAGE PO Take 1 tablet by mouth daily.   pantoprazole 40 MG tablet Commonly known as: PROTONIX Take 1 tablet (40 mg total) by mouth daily. Started by: Elige Radon Fredi Geiler, MD   sucralfate 1 g tablet Commonly known as: Carafate Take 1 tablet (1 g total) by mouth 4 (four) times daily -  with meals and at bedtime. Started by: Elige Radon Ananias Kolander, MD         Objective:   BP 133/65   Pulse 65   Ht 5\' 10"  (1.778 m)   Wt 176 lb (79.8 kg)   SpO2 95%   BMI 25.25 kg/m   Wt Readings from Last 3 Encounters:   06/27/22 176 lb (79.8 kg)  06/13/22 177 lb (80.3 kg)  12/04/21 181 lb (82.1 kg)    Physical Exam Vitals and nursing note reviewed.  Constitutional:      General: He is not in acute distress.    Appearance: He is well-developed. He is not diaphoretic.  Eyes:     General: No scleral icterus.    Conjunctiva/sclera: Conjunctivae normal.  Neck:     Thyroid: No thyromegaly.  Cardiovascular:     Rate and Rhythm: Normal rate and regular rhythm.     Heart sounds: Normal heart sounds. No murmur heard. Pulmonary:     Effort: Pulmonary effort is normal. No respiratory distress.     Breath sounds: Normal breath sounds. No wheezing.  Abdominal:     General: Abdomen is flat. Bowel sounds are normal. There is no distension.     Palpations: Abdomen is soft.     Tenderness: There is abdominal tenderness in the epigastric area. There is no right CVA tenderness, left CVA tenderness, guarding or rebound. Negative signs include Murphy's sign and McBurney's sign.     Hernia: No hernia is present.  Musculoskeletal:        General: Normal range of motion.     Cervical back: Neck supple.  Lymphadenopathy:     Cervical: No cervical adenopathy.  Skin:    General: Skin is warm and dry.     Findings: No rash.  Neurological:     Mental Status: He is alert and oriented to person, place, and time.     Coordination: Coordination normal.  Psychiatric:        Behavior: Behavior normal.       Assessment & Plan:   Problem List Items Addressed This Visit   None Visit Diagnoses     Epigastric abdominal pain    -  Primary   Relevant Medications   sucralfate (CARAFATE) 1 g tablet   pantoprazole (PROTONIX) 40 MG tablet   ondansetron (ZOFRAN-ODT) 4 MG disintegrating tablet   Other Relevant Orders   CBC with Differential/Platelet   CMP14+EGFR   TSH   Ill feeling       Relevant Orders   Glucose Hemocue Waived   CBC with Differential/Platelet   CMP14+EGFR   TSH     Will try Carafate and Protonix  to help calm it down, concerned that he possibly is developing an early ulceration, recommended that he use the Zofran as well stay hydrated and also restart on the MiraLAX to help with bowel movements.  Follow up plan: Return if symptoms worsen or fail to improve.  Counseling provided for all of  the vaccine components Orders Placed This Encounter  Procedures   Glucose Hemocue Waived   CBC with Differential/Platelet   CMP14+EGFR   TSH    Arville Care, MD [facility] 06/27/2022, 3:49 PM

## 2022-07-20 ENCOUNTER — Other Ambulatory Visit: Payer: Self-pay | Admitting: Family Medicine

## 2022-07-20 DIAGNOSIS — R1013 Epigastric pain: Secondary | ICD-10-CM

## 2022-07-23 ENCOUNTER — Ambulatory Visit (INDEPENDENT_AMBULATORY_CARE_PROVIDER_SITE_OTHER): Payer: Medicare HMO | Admitting: Nurse Practitioner

## 2022-07-23 ENCOUNTER — Encounter: Payer: Self-pay | Admitting: Nurse Practitioner

## 2022-07-23 VITALS — BP 108/73 | HR 66 | Temp 98.1°F | Resp 20 | Ht 70.0 in | Wt 176.0 lb

## 2022-07-23 DIAGNOSIS — R42 Dizziness and giddiness: Secondary | ICD-10-CM | POA: Diagnosis not present

## 2022-07-23 DIAGNOSIS — R519 Headache, unspecified: Secondary | ICD-10-CM

## 2022-07-23 MED ORDER — MECLIZINE HCL 25 MG PO TABS
25.0000 mg | ORAL_TABLET | Freq: Three times a day (TID) | ORAL | 0 refills | Status: DC | PRN
Start: 2022-07-23 — End: 2022-12-17

## 2022-07-23 MED ORDER — KETOROLAC TROMETHAMINE 60 MG/2ML IM SOLN
60.0000 mg | Freq: Once | INTRAMUSCULAR | Status: AC
Start: 2022-07-23 — End: 2022-07-23
  Administered 2022-07-23: 60 mg via INTRAMUSCULAR

## 2022-07-23 NOTE — Progress Notes (Addendum)
Subjective:    Patient ID: Robert [name], male    DOB: 03/17/1955, 67 y.o.   MRN: [id number]   Chief Complaint: Dizziness and Headache   Dizziness This is a new problem. The current episode started today. The problem occurs constantly. The problem has been waxing and waning. Associated symptoms include headaches. Pertinent negatives include no abdominal pain, chest pain, diaphoresis, rash or weakness. The symptoms are aggravated by standing. He has tried acetaminophen for the symptoms. The treatment provided mild relief.  Headache  This is a new problem. The current episode started today. The pain is located in the Frontal region. The pain does not radiate. The pain quality is not similar to prior headaches. The quality of the pain is described as band-like. The pain is at a severity of 5/10. The pain is moderate. Associated symptoms include dizziness. Pertinent negatives include no abdominal pain, eye pain or weakness. The symptoms are aggravated by activity. He has tried acetaminophen for the symptoms. The treatment provided mild relief.    Patient comes in today c/o headache and dizziness.   Patient Active Problem List   Diagnosis Date Noted   Cocaine abuse (HCC) 12/03/2020   Substance-induced disorder (HCC) 12/03/2020   Hyperlipidemia 10/19/2020   Overweight (BMI 25.0-29.9) 09/20/2017   Hypothyroidism following radioiodine therapy 03/11/2015   Thoracic aorta atherosclerosis (HCC) 04/13/2014  '    Review of Systems  Constitutional:  Negative for diaphoresis.  Eyes:  Negative for pain.  Respiratory:  Negative for shortness of breath.   Cardiovascular:  Negative for chest pain, palpitations and leg swelling.  Gastrointestinal:  Negative for abdominal pain.  Endocrine: Negative for polydipsia.  Skin:  Negative for rash.  Neurological:  Positive for dizziness and headaches. Negative for weakness.  Hematological:  Does not bruise/bleed easily.  All other systems reviewed and are  negative.      Objective:   Physical Exam Constitutional:      Appearance: Normal appearance. He is well-developed.  HENT:     Right Ear: Tympanic membrane normal.     Left Ear: Tympanic membrane normal.     Nose: Nose normal.     Mouth/Throat:     Mouth: Mucous membranes are moist.  Eyes:     Extraocular Movements: Extraocular movements intact.     Pupils: Pupils are equal, round, and reactive to light.  Cardiovascular:     Rate and Rhythm: Regular rhythm.  Pulmonary:     Effort: Pulmonary effort is normal.     Breath sounds: Normal breath sounds.  Musculoskeletal:     Cervical back: Normal range of motion and neck supple.  Skin:    General: Skin is warm.  Neurological:     General: No focal deficit present.     Mental Status: He is alert and oriented to person, place, and time.     Cranial Nerves: No cranial nerve deficit.     Sensory: No sensory deficit.  Psychiatric:        Mood and Affect: Mood normal.        Behavior: Behavior normal.    BP 108/73   Pulse 66   Temp 98.1 F (36.7 C) (Temporal)   Resp 20   Ht 5\' 10"  (1.778 m)   Wt 176 lb (79.8 kg)   SpO2 98%   BMI 25.25 kg/m         Assessment & Plan:   Robert [name] in today with chief complaint of Dizziness and Headache   1. Acute  nonintractable headache, unspecified headache type Tylenol OTC as needed Rest Avoid caffeine - ketorolac (TORADOL) injection 60 mg  2. Vertigo Fall prevention Sedation precautions with antivert - meclizine (ANTIVERT) 25 MG tablet; Take 1 tablet (25 mg total) by mouth 3 (three) times daily as needed for dizziness.  Dispense: 30 tablet; Refill: 0    The above assessment and management plan was discussed with the patient. The patient verbalized understanding of and has agreed to the management plan. Patient is aware to call the clinic if symptoms persist or worsen. Patient is aware when to return to the clinic for a follow-up visit. Patient educated on when it is  appropriate to go to the emergency department.   Mary-Margaret Daphine Deutscher, FNP

## 2022-07-23 NOTE — Patient Instructions (Signed)
General Headache Without Cause A headache is pain or discomfort you feel around the head or neck area. There are many causes and types of headaches. In some cases, the cause may not be found. Follow these instructions at home: Watch your condition for any changes. Let your doctor know about them. Take these steps to help with your condition: Managing pain     Take over-the-counter and prescription medicines only as told by your doctor. This includes medicines for pain that are taken by mouth or put on the skin. Lie down in a dark, quiet room when you have a headache. If told, put ice on your head and neck area: Put ice in a plastic bag. Place a towel between your skin and the bag. Leave the ice on for 20 minutes, 2-3 times per day. Take off the ice if your skin turns bright red. This is very important. If you cannot feel pain, heat, or cold, you have a greater risk of damage to the area. If told, put heat on the affected area. Use the heat source that your doctor recommends, such as a moist heat pack or a heating pad. Place a towel between your skin and the heat source. Leave the heat on for 20-30 minutes. Take off the heat if your skin turns bright red. This is very important. If you cannot feel pain, heat, or cold, you have a greater risk of getting burned. Keep lights dim if bright lights bother you or make your headaches worse. Eating and drinking Eat meals on a regular schedule. If you drink alcohol: Limit how much you have to: 0-1 drink a day for women who are not pregnant. 0-2 drinks a day for men. Know how much alcohol is in a drink. In the [place] one drink equals one 12 oz bottle of beer (355 mL), one 5 oz glass of wine (148 mL), or one 1 oz glass of hard liquor (44 mL). Stop drinking caffeine, or drink less caffeine. General instructions  Keep a journal to find out if certain things bring on headaches. For example, write down: What you eat and drink. How much sleep you  get. Any change to your diet or medicines. Get a massage or try other ways to relax. Limit stress. Sit up straight. Do not tighten (tense) your muscles. Do not smoke or use any products that contain nicotine or tobacco. If you need help quitting, ask your doctor. Exercise regularly as told by your doctor. Get enough sleep. This often means 7-9 hours of sleep each night. Keep all follow-up visits. This is important. Contact a doctor if: Medicine does not help your symptoms. You have a headache that feels different than the other headaches. You feel like you may vomit (nauseous) or you vomit. You have a fever. Get help right away if: Your headache: Gets very bad quickly. Gets worse after a lot of physical activity. You have any of these symptoms: You continue to vomit. A stiff neck. Trouble seeing. Your eye or ear hurts. Trouble speaking. Weak muscles or you lose muscle control. You lose your balance or have trouble walking. You feel like you will pass out (faint) or you pass out. You are mixed up (confused). You have a seizure. These symptoms may be an emergency. Get help right away. Call your local emergency services (911 in the [place] Do not wait to see if the symptoms will go away. Do not drive yourself to the hospital. Summary A headache is pain or discomfort that   is felt around the head or neck area. There are many causes and types of headaches. In some cases, the cause may not be found. Keep a journal to help find out what causes your headaches. Watch your condition for any changes. Let your doctor know about them. Contact a doctor if you have a headache that is different from usual, or if medicine does not help your headache. Get help right away if your headache gets very bad, you throw up, you have trouble seeing, you lose your balance, or you have a seizure. This information is not intended to replace advice given to you by your health care provider. Make sure you  discuss any questions you have with your health care provider. Document Revised: 06/08/2020 Document Reviewed: 06/08/2020 Elsevier Patient Education  2024 [employer]  

## 2022-11-23 ENCOUNTER — Other Ambulatory Visit: Payer: Self-pay | Admitting: Family Medicine

## 2022-12-17 ENCOUNTER — Ambulatory Visit (INDEPENDENT_AMBULATORY_CARE_PROVIDER_SITE_OTHER): Payer: Medicare HMO | Admitting: Family Medicine

## 2022-12-17 ENCOUNTER — Encounter: Payer: Self-pay | Admitting: Family Medicine

## 2022-12-17 VITALS — BP 133/82 | HR 64 | Ht 70.0 in | Wt 179.0 lb

## 2022-12-17 DIAGNOSIS — I700 Atherosclerosis of aorta: Secondary | ICD-10-CM | POA: Diagnosis not present

## 2022-12-17 DIAGNOSIS — R0989 Other specified symptoms and signs involving the circulatory and respiratory systems: Secondary | ICD-10-CM | POA: Diagnosis not present

## 2022-12-17 DIAGNOSIS — E782 Mixed hyperlipidemia: Secondary | ICD-10-CM

## 2022-12-17 DIAGNOSIS — R1013 Epigastric pain: Secondary | ICD-10-CM

## 2022-12-17 DIAGNOSIS — E890 Postprocedural hypothyroidism: Secondary | ICD-10-CM

## 2022-12-17 DIAGNOSIS — R059 Cough, unspecified: Secondary | ICD-10-CM

## 2022-12-17 MED ORDER — LEVOTHYROXINE SODIUM 137 MCG PO TABS: 137.0000 ug | ORAL_TABLET | Freq: Every day | ORAL | 3 refills | Status: DC

## 2022-12-17 MED ORDER — ATORVASTATIN CALCIUM 20 MG PO TABS: 20.0000 mg | ORAL_TABLET | Freq: Every day | ORAL | 3 refills | Status: DC

## 2022-12-17 MED ORDER — PANTOPRAZOLE SODIUM 40 MG PO TBEC: 40.0000 mg | DELAYED_RELEASE_TABLET | Freq: Every day | ORAL | 3 refills | Status: DC

## 2022-12-17 NOTE — Progress Notes (Signed)
BP 133/82   Pulse 64   Ht 5\' 10"  (1.778 m)   Wt 179 lb (81.2 kg)   SpO2 100%   BMI 25.68 kg/m    Subjective:   Patient ID: Robert [name], male    DOB: [birth year], 67 y.o.   MRN: [id number]  HPI: Robert [name] is a 67 y.o. male presenting on 12/17/2022 for Medical Management of Chronic Issues, Hypothyroidism, and Hyperlipidemia   HPI Hypothyroidism recheck Patient is coming in for thyroid recheck today as well. They deny any issues with hair changes or heat or cold problems or diarrhea or constipation. They deny any chest pain or palpitations. They are currently on levothyroxine 137 micrograms   Hyperlipidemia and aortic atherosclerosis Patient is coming in for recheck of his hyperlipidemia. The patient is currently taking atorvastatin 40. They deny any issues with myalgias or history of liver damage from it. They deny any focal numbness or weakness or chest pain.   GERD Patient is currently on pantoprazole as needed.  She denies any major symptoms or abdominal pain or belching or burping. She denies any blood in her stool or lightheadedness or dizziness.   Relevant past medical, surgical, family and social history reviewed and updated as indicated. Interim medical history since our last visit reviewed. Allergies and medications reviewed and updated.  Review of Systems  Constitutional:  Negative for chills and fever.  Eyes:  Negative for visual disturbance.  Respiratory:  Negative for shortness of breath and wheezing.   Cardiovascular:  Negative for chest pain and leg swelling.  Musculoskeletal:  Negative for back pain and gait problem.  Skin:  Negative for rash.  Neurological:  Negative for dizziness, weakness and light-headedness.  All other systems reviewed and are negative.   Per HPI unless specifically indicated above   Allergies as of 12/17/2022   No Known Allergies      Medication List        Accurate as of December 17, 2022  2:31 PM. If you have any  questions, ask your nurse or doctor.          STOP taking these medications    acetaminophen 325 MG tablet Commonly known as: TYLENOL Stopped by: Elige Radon Tabrina Esty   diclofenac 75 MG EC tablet Commonly known as: VOLTAREN Stopped by: Elige Radon Tenika Keeran   fluticasone 50 MCG/ACT nasal spray Commonly known as: FLONASE Stopped by: Elige Radon Loucinda Croy   ibuprofen 200 MG tablet Commonly known as: ADVIL Stopped by: Elige Radon Donavyn Fecher   levocetirizine 5 MG tablet Commonly known as: XYZAL Stopped by: Elige Radon Ketan Renz   lisinopril 10 MG tablet Commonly known as: ZESTRIL Stopped by: Elige Radon Lenox Bink   meclizine 25 MG tablet Commonly known as: ANTIVERT Stopped by: Elige Radon Elsi Stelzer   montelukast 10 MG tablet Commonly known as: SINGULAIR Stopped by: Elige Radon Rielly Brunn   nystatin cream Commonly known as: MYCOSTATIN Stopped by: Elige Radon Allysa Governale   ondansetron 4 MG disintegrating tablet Commonly known as: ZOFRAN-ODT Stopped by: Elige Radon Kashana Breach   ONE-A-DAY MENS 50+ ADVANTAGE PO Stopped by: Elige Radon Hazim Treadway   sucralfate 1 g tablet Commonly known as: Carafate Stopped by: Elige Radon Johnathyn Viscomi       TAKE these medications    atorvastatin 20 MG tablet Commonly known as: LIPITOR Take 1 tablet (20 mg total) by mouth daily at 4 PM.   levothyroxine 137 MCG tablet Commonly known as: SYNTHROID Take 1 tablet (137 mcg total) by mouth daily before breakfast.   pantoprazole  40 MG tablet Commonly known as: PROTONIX Take 1 tablet (40 mg total) by mouth daily.         Objective:   BP 133/82   Pulse 64   Ht 5\' 10"  (1.778 m)   Wt 179 lb (81.2 kg)   SpO2 100%   BMI 25.68 kg/m   Wt Readings from Last 3 Encounters:  12/17/22 179 lb (81.2 kg)  07/23/22 176 lb (79.8 kg)  06/27/22 176 lb (79.8 kg)    Physical Exam Vitals and nursing note reviewed.  Constitutional:      General: He is not in acute distress.    Appearance: He is well-developed. He is not  diaphoretic.  Eyes:     General: No scleral icterus.    Conjunctiva/sclera: Conjunctivae normal.  Neck:     Thyroid: No thyromegaly.  Cardiovascular:     Rate and Rhythm: Normal rate and regular rhythm.     Heart sounds: Normal heart sounds. No murmur heard. Pulmonary:     Effort: Pulmonary effort is normal. No respiratory distress.     Breath sounds: Normal breath sounds. No wheezing.  Musculoskeletal:        General: No swelling. Normal range of motion.     Cervical back: Neck supple.  Lymphadenopathy:     Cervical: No cervical adenopathy.  Skin:    General: Skin is warm and dry.     Findings: No rash.  Neurological:     Mental Status: He is alert and oriented to person, place, and time.     Coordination: Coordination normal.  Psychiatric:        Behavior: Behavior normal.       Assessment & Plan:   Problem List Items Addressed This Visit       Cardiovascular and Mediastinum   Thoracic aorta atherosclerosis (HCC)   Relevant Medications   atorvastatin (LIPITOR) 20 MG tablet     Endocrine   Hypothyroidism following radioiodine therapy   Relevant Medications   levothyroxine (SYNTHROID) 137 MCG tablet   Other Relevant Orders   Lipid panel   TSH     Other   Hyperlipidemia - Primary   Relevant Medications   atorvastatin (LIPITOR) 20 MG tablet   Other Relevant Orders   Lipid panel   CMP14+EGFR   Other Visit Diagnoses     Cough in adult       Chest congestion       Epigastric abdominal pain       Relevant Medications   pantoprazole (PROTONIX) 40 MG tablet       Seems to be doing well today, his thyroid was off last time so we will recheck it.  His blood pressure and everything else looks good today so we will see where the blood work is.  He did say he has been not taking his atorvastatin for some time so we will see where his cholesterol is and debate whether to go back on that or not. Follow up plan: Return in about 6 months (around 06/16/2023), or if  symptoms worsen or fail to improve, for Physical exam and thyroid recheck.  Counseling provided for all of the vaccine components Orders Placed This Encounter  Procedures   Lipid panel   TSH   CMP14+EGFR    Arville Care, MD [facility] 12/17/2022, 2:31 PM

## 2022-12-18 ENCOUNTER — Ambulatory Visit: Payer: Medicare HMO

## 2022-12-18 VITALS — Ht 70.0 in | Wt 176.0 lb

## 2022-12-18 DIAGNOSIS — Z0000 Encounter for general adult medical examination without abnormal findings: Secondary | ICD-10-CM | POA: Diagnosis not present

## 2022-12-18 LAB — CMP14+EGFR
ALT: 10 [IU]/L (ref 0–44)
AST: 22 [IU]/L (ref 0–40)
Albumin: 4.4 g/dL (ref 3.9–4.9)
Alkaline Phosphatase: 83 [IU]/L (ref 44–121)
BUN/Creatinine Ratio: 14 (ref 10–24)
BUN: 15 mg/dL (ref 8–27)
Bilirubin Total: 0.2 mg/dL (ref 0.0–1.2)
CO2: 23 mmol/L (ref 20–29)
Calcium: 9.6 mg/dL (ref 8.6–10.2)
Chloride: 102 mmol/L (ref 96–106)
Creatinine, Ser: 1.08 mg/dL (ref 0.76–1.27)
Globulin, Total: 3.2 g/dL (ref 1.5–4.5)
Glucose: 80 mg/dL (ref 70–99)
Potassium: 3.9 mmol/L (ref 3.5–5.2)
Sodium: 142 mmol/L (ref 134–144)
Total Protein: 7.6 g/dL (ref 6.0–8.5)
eGFR: 75 mL/min/{1.73_m2} (ref >59)

## 2022-12-18 LAB — LIPID PANEL
Chol/HDL Ratio: 4.1 {ratio} (ref 0.0–5.0)
Cholesterol, Total: 207 mg/dL — ABNORMAL HIGH (ref 100–199)
HDL: 51 mg/dL (ref >39)
LDL Chol Calc (NIH): 133 mg/dL — ABNORMAL HIGH (ref 0–99)
Triglycerides: 130 mg/dL (ref 0–149)
VLDL Cholesterol Cal: 23 mg/dL (ref 5–40)

## 2022-12-18 LAB — TSH: TSH: 0.092 u[IU]/mL — ABNORMAL LOW (ref 0.450–4.500)

## 2022-12-18 NOTE — Progress Notes (Signed)
Subjective:   Robert [name] is a 67 y.o. male who presents for Medicare Annual/Subsequent preventive examination.  Visit Complete: Virtual I connected with  Robert [name] on 12/18/22 by a audio enabled telemedicine application and verified that I am speaking with the correct person using two identifiers.  Patient Location: Home  Provider Location: Home Office  I discussed the limitations of evaluation and management by telemedicine. The patient expressed understanding and agreed to proceed.  Vital Signs: Because this visit was a virtual/telehealth visit, some criteria may be missing or patient reported. Any vitals not documented were not able to be obtained and vitals that have been documented are patient reported.  Cardiac Risk Factors include: advanced age (>41men, >72 women);dyslipidemia;hypertension;male gender     Objective:    Today's Vitals   12/18/22 1426  Weight: 176 lb (79.8 kg)  Height: 5\' 10"  (1.778 m)   Body mass index is 25.25 kg/m.     12/18/2022    2:41 PM 06/15/2021   12:47 PM 12/01/2020    6:40 PM 08/10/2020    7:03 AM 06/14/2020    1:52 PM 02/01/2020    9:56 AM 12/10/2019    6:21 PM  Advanced Directives  Does Patient Have a Medical Advance Directive? No No No No No No   Would patient like information on creating a medical advance directive? Yes (MAU/Ambulatory/Procedural Areas - Information given) No - Patient declined  No - Patient declined Yes (MAU/Ambulatory/Procedural Areas - Information given)       Information is confidential and restricted. Go to Review Flowsheets to unlock data.     Current Medications (verified) Outpatient Encounter Medications as of 12/18/2022  Medication Sig   levothyroxine (SYNTHROID) 137 MCG tablet Take 1 tablet (137 mcg total) by mouth daily before breakfast.   pantoprazole (PROTONIX) 40 MG tablet Take 1 tablet (40 mg total) by mouth daily.   atorvastatin (LIPITOR) 20 MG tablet Take 1 tablet (20 mg total) by mouth daily  at 4 PM. (Patient not taking: Reported on 12/18/2022)   [DISCONTINUED] diphenhydrAMINE (BENADRYL) 25 MG tablet Take 25-50 mg by mouth daily as needed. For allergic reaction   [DISCONTINUED] fexofenadine (ALLEGRA) 180 MG tablet 1 po qAM for hives and itching   No facility-administered encounter medications on file as of 12/18/2022.    Allergies (verified) Patient has no known allergies.   History: Past Medical History:  Diagnosis Date   Thyroid disease    Past Surgical History:  Procedure Laterality Date   COLONOSCOPY  1980's   in [facility]   HARDWARE REMOVAL Left 08/10/2020   Procedure: HARDWARE REMOVAL;  Surgeon: Roby Lofts, MD;  Location: [facility] OR;  Service: Orthopedics;  Laterality: Left;   HERNIA REPAIR  03/2014   Umbilical   KNEE ARTHROSCOPY Left 08/10/2020   Procedure: ARTHROSCOPY KNEE WITH CHONDROPLASTY; PARTIAL MEDIAL MENISCECTOMY LYSIS OF ADHESIONS AND MANIPULATION;  Surgeon: Bjorn Pippin, MD;  Location: [facility] OR;  Service: Orthopedics;  Laterality: Left;   NO PAST SURGERIES     ORIF PATELLA Left 12/11/2019   Procedure: OPEN REDUCTION INTERNAL (ORIF) FIXATION PATELLA;  Surgeon: Roby Lofts, MD;  Location: [facility] OR;  Service: Orthopedics;  Laterality: Left;   Family History  Problem Relation Age of Onset   Heart Problems Mother    Suicidality Father    Thyroid disease Neg Hx    Colon cancer Neg Hx    Social History   Socioeconomic History   Marital status: Single    Spouse name: Not  on file   Number of children: 0   Years of education: Not on file   Highest education level: Not on file  Occupational History   Occupation: administer  Tobacco Use   Smoking status: Former    Current packs/day: 0.00    Average packs/day: 0.3 packs/day for 10.0 years (2.5 ttl pk-yrs)    Types: Cigarettes    Start date: 12/08/2000    Quit date: 12/09/2010    Years since quitting: 12.0   Smokeless tobacco: Never  Vaping Use   Vaping status: Never Used  Substance and Sexual  Activity   Alcohol use: Never   Drug use: Not Currently    Types: Cocaine    Comment: previous cocaine user. clean for 9 years   Sexual activity: Not Currently  Other Topics Concern   Not on file  Social History Narrative   ** Merged History Encounter **   Lives alone; 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   Brother passed away 2020-03-25 in a house fire - has nieces and nephews nearby   Social Determinants of Health   Financial Resource Strain: Low Risk  (12/18/2022)   Overall Financial Resource Strain (CARDIA)    Difficulty of Paying Living Expenses: Not hard at all  Food Insecurity: No Food Insecurity (12/18/2022)   Hunger Vital Sign    Worried About Running Out of Food in the Last Year: Never true    Ran Out of Food in the Last Year: Never true  Transportation Needs: No Transportation Needs (12/18/2022)   PRAPARE - Administrator, Civil Service (Medical): No    Lack of Transportation (Non-Medical): No  Physical Activity: Insufficiently Active (12/18/2022)   Exercise Vital Sign    Days of Exercise per Week: 3 days    Minutes of Exercise per Session: 30 min  Stress: No Stress Concern Present (12/18/2022)   [employer] of Occupational Health - Occupational Stress Questionnaire    Feeling of Stress : Not at all  Social Connections: Moderately Integrated (12/18/2022)   Social Connection and Isolation Panel [NHANES]    Frequency of Communication with Friends and Family: Three times a week    Frequency of Social Gatherings with Friends and Family: Twice a week    Attends Religious Services: More than 4 times per year    Active Member of [employer] or Organizations: Yes    Attends Engineer, structural: More than 4 times per year    Marital Status: Never married    Tobacco Counseling Counseling given: Not Answered   Clinical Intake:  Pre-visit preparation completed: Yes  Pain : No/denies pain     Diabetes: No  How often do you need to have  someone help you when you read instructions, pamphlets, or other written materials from your doctor or pharmacy?: 1 - Never  Interpreter Needed?: No  Information entered by :: Kandis Fantasia LPN   Activities of Daily Living    12/18/2022    2:28 PM  In your present state of health, do you have any difficulty performing the following activities:  Hearing? 0  Vision? 0  Difficulty concentrating or making decisions? 0  Walking or climbing stairs? 0  Dressing or bathing? 0  Doing errands, shopping? 0  Preparing Food and eating ? N  Using the Toilet? N  In the past six months, have you accidently leaked urine? N  Do you have problems with loss of bowel control? N  Managing your Medications? N  Managing your Finances? N  Housekeeping or managing your Housekeeping? N    Patient Care Team: Dettinger, Elige Radon, MD as PCP - General (Family Medicine) Dettinger, Elige Radon, MD (Family Medicine) Delora Fuel, OD (Optometry)  Indicate any recent Medical Services you may have received from other than [facility] providers in the past year (date may be approximate).     Assessment:   This is a routine wellness examination for Robert [name].  Hearing/Vision screen Hearing Screening - Comments:: Hard of hearing  Vision Screening - Comments:: Wears rx glasses - up to date with routine eye exams with [facility]. Wyn Forster     Goals Addressed             This Visit's Progress    Remain active and independent        Depression Screen    12/18/2022    2:40 PM 12/17/2022    2:27 PM 06/27/2022    3:42 PM 06/13/2022   10:52 AM 12/04/2021   11:54 AM 06/15/2021   12:44 PM 06/06/2021    2:39 PM  PHQ 2/9 Scores  PHQ - 2 Score 0 0 0 0 0 0 0  PHQ- 9 Score 0  0 0 0 0 0    Fall Risk    12/18/2022    2:42 PM 12/17/2022    2:27 PM 06/27/2022    3:42 PM 06/13/2022   10:52 AM 12/04/2021   11:54 AM  Fall Risk   Falls in the past year? 0 0 0 0 0  Number falls in past yr: 0      Injury with Fall? 0       Risk for fall due to : No Fall Risks      Follow up Falls prevention discussed;Education provided;Falls evaluation completed        MEDICARE RISK AT HOME: Medicare Risk at Home Any stairs in or around the home?: No If so, are there any without handrails?: No Home free of loose throw rugs in walkways, pet beds, electrical cords, etc?: Yes Adequate lighting in your home to reduce risk of falls?: Yes Life alert?: No Use of a cane, walker or w/c?: No Grab bars in the bathroom?: Yes Shower chair or bench in shower?: No Elevated toilet seat or a handicapped toilet?: Yes  TIMED UP AND GO:  Was the test performed?  No    Cognitive Function:        12/18/2022    2:43 PM 06/15/2021   12:49 PM 06/14/2020    1:43 PM  6CIT Screen  What Year? 0 points 0 points 0 points  What month? 0 points 0 points 0 points  What time? 0 points 0 points 0 points  Count back from 20 0 points 0 points 0 points  Months in reverse 2 points 0 points 2 points  Repeat phrase 0 points 2 points 0 points  Total Score 2 points 2 points 2 points    Immunizations Immunization History  Administered Date(s) Administered   Fluad Quad(high Dose 65+) 10/21/2019   Influenza,inj,Quad PF,6+ Mos 11/21/2017, 10/01/2018   Influenza,trivalent, recombinat, inj, PF 10/26/2016   Influenza-Unspecified 10/31/2015   Pneumococcal Conjugate-13 05/08/2019   Tdap 12/10/2019    TDAP status: Up to date  Flu Vaccine status: Declined, Education has been provided regarding the importance of this vaccine but patient still declined. Advised may receive this vaccine at local pharmacy or Health Dept. Aware to provide a copy of the vaccination record if obtained from local pharmacy or  Health Dept. Verbalized acceptance and understanding.  Pneumococcal vaccine status: Declined,  Education has been provided regarding the importance of this vaccine but patient still declined. Advised may receive this vaccine at local pharmacy or Health  Dept. Aware to provide a copy of the vaccination record if obtained from local pharmacy or Health Dept. Verbalized acceptance and understanding.   Covid-19 vaccine status: Declined, Education has been provided regarding the importance of this vaccine but patient still declined. Advised may receive this vaccine at local pharmacy or Health Dept.or vaccine clinic. Aware to provide a copy of the vaccination record if obtained from local pharmacy or Health Dept. Verbalized acceptance and understanding.  Qualifies for Shingles Vaccine? Yes   Zostavax completed No   Shingrix Completed?: No.    Education has been provided regarding the importance of this vaccine. Patient has been advised to call insurance company to determine out of pocket expense if they have not yet received this vaccine. Advised may also receive vaccine at local pharmacy or Health Dept. Verbalized acceptance and understanding.  Screening Tests Health Maintenance  Topic Date Due   COVID-19 Vaccine (1) 01/02/2023 (Originally 01/04/1961)   INFLUENZA VACCINE  04/22/2023 (Originally 08/23/2022)   Pneumonia Vaccine 18+ Years old (2 of 2 - PPSV23 or PCV20) 06/13/2023 (Originally 05/07/2020)   Zoster Vaccines- Shingrix (1 of 2) 12/17/2023 (Originally 01/03/1973)   Medicare Annual Wellness (AWV)  12/18/2023   Colonoscopy  03/15/2024   DTaP/Tdap/Td (2 - Td or Tdap) 12/09/2029   Hepatitis C Screening  Completed   HPV VACCINES  Aged Out    Health Maintenance  There are no preventive care reminders to display for this patient.   Colorectal cancer screening: Type of screening: Colonoscopy. Completed 03/15/14. Repeat every 10 years  Lung Cancer Screening: (Low Dose CT Chest recommended if Age 59-80 years, 20 pack-year currently smoking OR have quit w/in 15years.) does not qualify.   Lung Cancer Screening Referral: n/a   Additional Screening:  Hepatitis C Screening: does qualify; Completed 09/06/16  Vision Screening: Recommended annual  ophthalmology exams for early detection of glaucoma and other disorders of the eye. Is the patient up to date with their annual eye exam?  Yes  Who is the provider or what is the name of the office in which the patient attends annual eye exams? [facility].  If pt is not established with a provider, would they like to be referred to a provider to establish care? No .   Dental Screening: Recommended annual dental exams for proper oral hygiene  Community Resource Referral / Chronic Care Management: CRR required this visit?  No   CCM required this visit?  No     Plan:     I have personally reviewed and noted the following in the patient's chart:   Medical and social history Use of alcohol, tobacco or illicit drugs  Current medications and supplements including opioid prescriptions. Patient is not currently taking opioid prescriptions. Functional ability and status Nutritional status Physical activity Advanced directives List of other physicians Hospitalizations, surgeries, and ER visits in previous 12 months Vitals Screenings to include cognitive, depression, and falls Referrals and appointments  In addition, I have reviewed and discussed with patient certain preventive protocols, quality metrics, and best practice recommendations. A written personalized care plan for preventive services as well as general preventive health recommendations were provided to patient.     Kandis Fantasia [place], [place]   [id number]   After Visit Summary: (MyChart) Due to this being a telephonic visit,  the after visit summary with patients personalized plan was offered to patient via MyChart   Nurse Notes: No concerns at this time

## 2022-12-18 NOTE — Patient Instructions (Signed)
Mr. Schurtz , Thank you for taking time to come for your Medicare Wellness Visit. I appreciate your ongoing commitment to your health goals. Please review the following plan we discussed and let me know if I can assist you in the future.   Referrals/Orders/Follow-Ups/Clinician Recommendations: Aim for 30 minutes of exercise or brisk walking, 6-8 glasses of water, and 5 servings of fruits and vegetables each day.  This is a list of the screening recommended for you and due dates:  Health Maintenance  Topic Date Due   COVID-19 Vaccine (1) 01/02/2023*   Flu Shot  04/22/2023*   Pneumonia Vaccine (2 of 2 - PPSV23 or PCV20) 06/13/2023*   Zoster (Shingles) Vaccine (1 of 2) 12/17/2023*   Medicare Annual Wellness Visit  12/18/2023   Colon Cancer Screening  03/15/2024   DTaP/Tdap/Td vaccine (2 - Td or Tdap) 12/09/2029   Hepatitis C Screening  Completed   HPV Vaccine  Aged Out  *Topic was postponed. The date shown is not the original due date.    Advanced directives: (ACP Link)Information on Advanced Care Planning can be found at [facility] Advance Health Care Directives Advance Health Care Directives ([email or web address]   Next Medicare Annual Wellness Visit scheduled for next year: Yes

## 2022-12-24 MED ORDER — LEVOTHYROXINE SODIUM 125 MCG PO TABS: 125.0000 ug | ORAL_TABLET | Freq: Every day | ORAL | 3 refills | Status: DC

## 2022-12-24 NOTE — Addendum Note (Signed)
Addended by: Ignacia Bayley on: 12/24/2022 08:32 AM   Modules accepted: Orders

## 2023-01-01 ENCOUNTER — Encounter: Payer: Self-pay | Admitting: *Deleted

## 2023-02-21 ENCOUNTER — Telehealth: Payer: Self-pay | Admitting: Family Medicine

## 2023-02-21 NOTE — Telephone Encounter (Signed)
Called and spoke with pharmacy and they stated he did not get the lot number that was recalled. Patient is aware of this because he walked in office today. Patient states he is sore in his neck where is thyroid was at and it is sore at all times when it is touched. Ever since his thyroid dose was changed. Now when he its it is hard to swallow like he is going to choke on it. This also did not happen until dose was changed. Patient states he had radiation treatment on thyroid years ago. Please advise.

## 2023-02-21 NOTE — Telephone Encounter (Signed)
Copied from CRM [id number]. Topic: Clinical - Prescription Issue >> Feb 21, 2023  8:50 AM Ivette P wrote: Reason for CRM: Pt medication levothyroxine (SYNTHROID) 125 MCG tablet was recalled, Pt would like someone to reach out to help him, callback [phone]

## 2023-02-22 NOTE — Telephone Encounter (Signed)
I would have him contact the pharmacy because they should give him a replacement of any recalled medicine that is clean.  Also make him an appointment if he is sore so we can get him in to evaluate his thyroid and do some testing.

## 2023-02-22 NOTE — Telephone Encounter (Signed)
Pt notified and has been scheduled for next week. LS

## 2023-02-27 ENCOUNTER — Encounter: Payer: Self-pay | Admitting: Family Medicine

## 2023-02-27 ENCOUNTER — Ambulatory Visit (INDEPENDENT_AMBULATORY_CARE_PROVIDER_SITE_OTHER): Payer: PPO | Admitting: Family Medicine

## 2023-02-27 VITALS — BP 127/78 | HR 63 | Ht 70.0 in | Wt 178.0 lb

## 2023-02-27 DIAGNOSIS — E890 Postprocedural hypothyroidism: Secondary | ICD-10-CM

## 2023-02-27 DIAGNOSIS — R1013 Epigastric pain: Secondary | ICD-10-CM | POA: Diagnosis not present

## 2023-02-27 DIAGNOSIS — K209 Esophagitis, unspecified without bleeding: Secondary | ICD-10-CM

## 2023-02-27 DIAGNOSIS — R1319 Other dysphagia: Secondary | ICD-10-CM

## 2023-02-27 MED ORDER — PANTOPRAZOLE SODIUM 40 MG PO TBEC: 40.0000 mg | DELAYED_RELEASE_TABLET | Freq: Every day | ORAL | 3 refills | Status: DC

## 2023-02-27 NOTE — Progress Notes (Signed)
 BP 127/78   Pulse 63   Ht 5' 10 (1.778 m)   Wt 80.7 kg   SpO2 99%   BMI 25.54 kg/m    Subjective:   Patient ID: Robert [name], male    DOB: August 27, 1955, 68 y.o.   MRN: [id number]  HPI: Robert [name] is a 68 y.o. male presenting on 02/27/2023 for Neck Pain (Where thyroid  is located Pain present since changing thyroid  med at last visit.)   Neck Pain  Pertinent negatives include no chest pain, fever, headaches or weakness.   For a few months he has been experiencing an increase in neck pain, that he believes is his thyroid . At his last visit on 12/17/2023 his Levothyroxine  was decreased to 125mcg due to TSH level of 0.092. He reports that he has been feeling miserable. His neck is painful to touch and throat is sore. He has experienced occasional issues with swallowing and choking. He continues to take thyroid  medication as prescribed. Reports he is getting over what he described as cold symptoms. He has used O-T-C allergy relief and Nyquil with improvement of symptoms. He stopped taking his Protonix  medication a while back. He also mention his mouth has been dry. Denies weakness, chest pain or shortness of breath. Denies any hot or cold intolerance.   Relevant past medical, surgical, family and social history reviewed and updated as indicated. Interim medical history since our last visit reviewed. Allergies and medications reviewed and updated.  Review of Systems  Constitutional:  Negative for appetite change, chills, fever and unexpected weight change.  Respiratory:  Positive for cough and choking. Negative for chest tightness and shortness of breath.   Cardiovascular:  Negative for chest pain and leg swelling.  Endocrine: Negative for cold intolerance and heat intolerance.  Musculoskeletal:  Positive for neck pain.  Allergic/Immunologic: Negative for environmental allergies and food allergies.  Neurological:  Negative for dizziness, weakness, light-headedness and headaches.   Psychiatric/Behavioral:  Negative for agitation.   All other systems reviewed and are negative.   Per HPI unless specifically indicated above   Allergies as of 02/27/2023   No Known Allergies      Medication List        Accurate as of February 27, 2023  1:35 PM. If you have any questions, ask your nurse or doctor.          atorvastatin  20 MG tablet Commonly known as: LIPITOR Take 1 tablet (20 mg total) by mouth daily at 4 PM.   levothyroxine  125 MCG tablet Commonly known as: SYNTHROID  Take 1 tablet (125 mcg total) by mouth daily.   pantoprazole  40 MG tablet Commonly known as: PROTONIX  Take 1 tablet (40 mg total) by mouth daily.         Objective:   BP 127/78   Pulse 63   Ht 5' 10 (1.778 m)   Wt 80.7 kg   SpO2 99%   BMI 25.54 kg/m   Wt Readings from Last 3 Encounters:  02/27/23 80.7 kg  12/18/22 79.8 kg  12/17/22 81.2 kg    Physical Exam Vitals reviewed.  Constitutional:      General: He is not in acute distress.    Appearance: Normal appearance. He is not diaphoretic.  HENT:     Right Ear: Tympanic membrane normal.     Left Ear: Tympanic membrane normal.     Mouth/Throat:     Lips: Pink.     Mouth: Mucous membranes are moist.     Pharynx: Oropharynx is  clear.  Eyes:     Conjunctiva/sclera: Conjunctivae normal.  Neck:     Thyroid : No thyroid  mass or thyromegaly.     Trachea: Trachea normal.  Cardiovascular:     Rate and Rhythm: Normal rate and regular rhythm.  Pulmonary:     Effort: Pulmonary effort is normal. No respiratory distress.     Breath sounds: Normal breath sounds.  Abdominal:     Tenderness: There is no abdominal tenderness. There is no guarding.  Musculoskeletal:     Cervical back: No edema, erythema, signs of trauma, tenderness or crepitus. Muscular tenderness (anterior cervical region) present. No pain with movement.  Lymphadenopathy:     Cervical: No cervical adenopathy.  Skin:    General: Skin is warm and dry.   Neurological:     Mental Status: He is alert and oriented to person, place, and time.  Psychiatric:        Behavior: Behavior normal.      Assessment & Plan:   Problem List Items Addressed This Visit       Endocrine   Hypothyroidism following radioiodine therapy - Primary   Relevant Orders   US  THYROID    CBC with Differential/Platelet   TSH   Other Visit Diagnoses       Epigastric abdominal pain       Relevant Medications   pantoprazole  (PROTONIX ) 40 MG tablet   Other Relevant Orders   CBC with Differential/Platelet   TSH     Esophagitis       Relevant Medications   pantoprazole  (PROTONIX ) 40 MG tablet   Other Relevant Orders   CBC with Differential/Platelet   TSH     Esophageal dysphagia       Relevant Orders   US  THYROID        Differential diagnosis if esophagitis, esophageal stricture, gastroesophageal reflux disease, sinusitis drainage, or thyroid  issue. There was noted tenderness on the anterior cervical region of neck. We will check thyroid  levels and send him for a thyroid  ultrasound, although this is unlikely the source of his issue. Will also obtain CBC to assess inflammatory markers.   Instructed him to start taking his Protonix  tablet daily for 1(one) month. If his symptoms do not improve we will look into issues further and obtain a CT scan of neck and abdomen or Esophagogastroduodenoscopy (EGD) to assess structures for possible issues.   Follow up plan: Return if symptoms worsen or fail to improve.  Counseling provided for all of the vaccine components Orders Placed This Encounter  Procedures   US  THYROID    CBC with Differential/Platelet   TSH    Vicenta Hummer PA-S [facility] 02/27/2023, 1:35 PM  I was personally present for all components of the history, physical exam and/or medical decision making.  I agree with the documentation performed by the student and agree with assessment and plan above.  Patient coming in  for recheck for hypothyroidism, also having some dysphagia and difficulty swallowing at times.  Likely GERD related but will do thyroid  ultrasound to check on size.  He feels like the change happened when the change in meds happened. Fonda Levins, MD [facility] 03/11/2023, 7:45 AM

## 2023-02-28 LAB — CBC WITH DIFFERENTIAL/PLATELET
Basophils Absolute: 0 10*3/uL (ref 0.0–0.2)
Basos: 1 %
EOS (ABSOLUTE): 0.1 10*3/uL (ref 0.0–0.4)
Eos: 1 %
Hematocrit: 39.2 % (ref 37.5–51.0)
Hemoglobin: 13.2 g/dL (ref 13.0–17.7)
Immature Grans (Abs): 0 10*3/uL (ref 0.0–0.1)
Immature Granulocytes: 0 %
Lymphocytes Absolute: 1.2 10*3/uL (ref 0.7–3.1)
Lymphs: 32 %
MCH: 31.4 pg (ref 26.6–33.0)
MCHC: 33.7 g/dL (ref 31.5–35.7)
MCV: 93 fL (ref 79–97)
Monocytes Absolute: 0.3 10*3/uL (ref 0.1–0.9)
Monocytes: 7 %
Neutrophils Absolute: 2.2 10*3/uL (ref 1.4–7.0)
Neutrophils: 59 %
Platelets: 205 10*3/uL (ref 150–450)
RBC: 4.2 x10E6/uL (ref 4.14–5.80)
RDW: 13.3 % (ref 11.6–15.4)
WBC: 3.8 10*3/uL (ref 3.4–10.8)

## 2023-02-28 LAB — TSH: TSH: 0.336 u[IU]/mL — ABNORMAL LOW (ref 0.450–4.500)

## 2023-03-06 ENCOUNTER — Encounter: Payer: Self-pay | Admitting: Family Medicine

## 2023-03-07 ENCOUNTER — Ambulatory Visit (HOSPITAL_COMMUNITY)
Admission: RE | Admit: 2023-03-07 | Discharge: 2023-03-07 | Disposition: A | Discharge status: Home / Self Care | Payer: PPO | Source: Ambulatory Visit | Attending: Family Medicine | Admitting: Family Medicine

## 2023-03-07 DIAGNOSIS — E890 Postprocedural hypothyroidism: Secondary | ICD-10-CM | POA: Insufficient documentation

## 2023-03-07 DIAGNOSIS — E0510 Thyrotoxicosis with toxic single thyroid nodule without thyrotoxic crisis or storm: Secondary | ICD-10-CM | POA: Diagnosis not present

## 2023-03-07 DIAGNOSIS — M542 Cervicalgia: Secondary | ICD-10-CM | POA: Diagnosis not present

## 2023-03-07 DIAGNOSIS — R1319 Other dysphagia: Secondary | ICD-10-CM | POA: Insufficient documentation

## 2023-03-07 MED ORDER — LEVOTHYROXINE SODIUM 112 MCG PO TABS: 112.0000 ug | ORAL_TABLET | Freq: Every day | ORAL | 1 refills | Status: DC

## 2023-03-08 ENCOUNTER — Other Ambulatory Visit: Payer: Self-pay | Admitting: *Deleted

## 2023-03-08 MED ORDER — LEVOTHYROXINE SODIUM 112 MCG PO TABS: 112.0000 ug | ORAL_TABLET | Freq: Every day | ORAL | 1 refills | Status: DC

## 2023-03-18 ENCOUNTER — Encounter: Payer: Self-pay | Admitting: Family Medicine

## 2023-05-09 ENCOUNTER — Encounter: Payer: Self-pay | Admitting: Family Medicine

## 2023-05-09 ENCOUNTER — Telehealth (INDEPENDENT_AMBULATORY_CARE_PROVIDER_SITE_OTHER): Admitting: Family Medicine

## 2023-05-09 DIAGNOSIS — R051 Acute cough: Secondary | ICD-10-CM | POA: Diagnosis not present

## 2023-05-09 DIAGNOSIS — J329 Chronic sinusitis, unspecified: Secondary | ICD-10-CM | POA: Diagnosis not present

## 2023-05-09 MED ORDER — BENZONATATE 100 MG PO CAPS: 100.0000 mg | ORAL_CAPSULE | Freq: Three times a day (TID) | ORAL | 0 refills | Status: DC | PRN

## 2023-05-09 MED ORDER — AZITHROMYCIN 250 MG PO TABS: ORAL_TABLET | ORAL | 0 refills | Status: AC

## 2023-05-09 NOTE — Progress Notes (Signed)
 Virtual Visit via Video Note  I connected with Robert [name] on 05/09/23 at  4:30 PM EDT by a video enabled telemedicine application and verified that I am speaking with the correct person using two identifiers.  Patient Location: Other:  car parked Provider Location: Home Office  I discussed the limitations, risks, security, and privacy concerns of performing an evaluation and management service by video and the availability of in person appointments. I also discussed with the patient that there may be a patient responsible charge related to this service. The patient expressed understanding and agreed to proceed.  Subjective: PCP: Dettinger, Elige Radon, MD  Chief Complaint  Patient presents with   Cough   Cough   States that since Sunday night he has sneezing, coughing, pressure around eyes, rhinorrhea.  Trying nyquil, dayquil, alka seltzer plus, flonase, "allergy pill" and cough drops.  Denies fever, N/V.  He has not taken a home covid/RSV test.   ROS: Per HPI  Current Outpatient Medications:    atorvastatin (LIPITOR) 20 MG tablet, Take 1 tablet (20 mg total) by mouth daily at 4 PM., Disp: 90 tablet, Rfl: 3   levothyroxine (SYNTHROID) 112 MCG tablet, Take 1 tablet (112 mcg total) by mouth daily., Disp: 90 tablet, Rfl: 1   pantoprazole (PROTONIX) 40 MG tablet, Take 1 tablet (40 mg total) by mouth daily., Disp: 90 tablet, Rfl: 3  Observations/Objective: There were no vitals filed for this visit. Physical Exam Constitutional:      General: He is awake. He is not in acute distress.    Appearance: Normal appearance. He is well-developed and well-groomed. He is ill-appearing. He is not toxic-appearing or diaphoretic.  HENT:     Nose: Congestion present.  Pulmonary:     Effort: Pulmonary effort is normal.  Neurological:     General: No focal deficit present.     Mental Status: He is alert and oriented to person, place, and time.  Psychiatric:        Attention and Perception:  Attention and perception normal.        Mood and Affect: Mood and affect normal.        Speech: Speech normal.        Behavior: Behavior normal. Behavior is cooperative.        Thought Content: Thought content normal.        Cognition and Memory: Cognition and memory normal.        Judgment: Judgment normal.    Assessment and Plan: 1. Acute cough (Primary) Will start medication as below. Discussed with patient that likely viral at this point however, will prescribe a pocket prescription for patient and discussed when to start abx.  - benzonatate (TESSALON PERLES) 100 MG capsule; Take 1 capsule (100 mg total) by mouth 3 (three) times daily as needed.  Dispense: 20 capsule; Refill: 0 - azithromycin (ZITHROMAX) 250 MG tablet; Take 2 tablets on day 1, then 1 tablet daily on days 2 through 5  Dispense: 6 tablet; Refill: 0  2. Rhinosinusitis As above.  - benzonatate (TESSALON PERLES) 100 MG capsule; Take 1 capsule (100 mg total) by mouth 3 (three) times daily as needed.  Dispense: 20 capsule; Refill: 0 - azithromycin (ZITHROMAX) 250 MG tablet; Take 2 tablets on day 1, then 1 tablet daily on days 2 through 5  Dispense: 6 tablet; Refill: 0  Appt time 05/09/2023 04:30 PM Call duration: 00:06:09  Follow Up Instructions: No follow-ups on file.   I discussed the assessment and  treatment plan with the patient. The patient was provided an opportunity to ask questions, and all were answered. The patient agreed with the plan and demonstrated an understanding of the instructions.   The patient was advised to call back or seek an in-person evaluation if the symptoms worsen or if the condition fails to improve as anticipated.  The above assessment and management plan was discussed with the patient. The patient verbalized understanding of and has agreed to the management plan.   Jacqualyn Mates, DNP-FNP [facility] [address] [place], [place] [address] [phone]

## 2023-06-13 ENCOUNTER — Encounter: Payer: Self-pay | Admitting: Family Medicine

## 2023-06-13 ENCOUNTER — Ambulatory Visit: Payer: Medicare HMO | Admitting: Family Medicine

## 2023-06-13 VITALS — BP 123/76 | HR 88 | Temp 98.0°F | Ht 70.0 in | Wt 179.0 lb

## 2023-06-13 DIAGNOSIS — E782 Mixed hyperlipidemia: Secondary | ICD-10-CM | POA: Diagnosis not present

## 2023-06-13 DIAGNOSIS — Z0001 Encounter for general adult medical examination with abnormal findings: Secondary | ICD-10-CM

## 2023-06-13 DIAGNOSIS — Z125 Encounter for screening for malignant neoplasm of prostate: Secondary | ICD-10-CM | POA: Diagnosis not present

## 2023-06-13 DIAGNOSIS — Z0000 Encounter for general adult medical examination without abnormal findings: Secondary | ICD-10-CM | POA: Diagnosis not present

## 2023-06-13 DIAGNOSIS — I700 Atherosclerosis of aorta: Secondary | ICD-10-CM

## 2023-06-13 DIAGNOSIS — E890 Postprocedural hypothyroidism: Secondary | ICD-10-CM

## 2023-06-13 NOTE — Progress Notes (Signed)
 BP 123/76   Pulse 88   Temp 98 F (36.7 C)   Ht 5\' 10"  (1.778 m)   Wt 179 lb (81.2 kg)   SpO2 96%   BMI 25.68 kg/m    Subjective:   Patient ID: Robert [name], male    DOB: May 18, 1955, 68 y.o.   MRN: [id number]  HPI: Robert [name] is a 67 y.o. male presenting on 06/13/2023 for Medical Management of Chronic Issues (CPE)   HPI Physical exam Patient denies any chest pain, shortness of breath, headaches or vision issues, abdominal complaints, diarrhea, nausea, vomiting, or joint issues.   Hypothyroidism recheck Patient is coming in for thyroid  recheck today as well. They deny any issues with hair changes or heat or cold problems or diarrhea or constipation. They deny any chest pain or palpitations. They are currently on levothyroxine  112 micrograms   Hyperlipidemia and atherosclerosis Patient is coming in for recheck of his hyperlipidemia. The patient is currently taking atorvastatin . They deny any issues with myalgias or history of liver damage from it. They deny any focal numbness or weakness or chest pain.   Relevant past medical, surgical, family and social history reviewed and updated as indicated. Interim medical history since our last visit reviewed. Allergies and medications reviewed and updated.  Review of Systems  Constitutional:  Negative for chills and fever.  HENT:  Negative for ear pain and tinnitus.   Eyes:  Negative for pain and visual disturbance.  Respiratory:  Negative for cough, shortness of breath and wheezing.   Cardiovascular:  Negative for chest pain, palpitations and leg swelling.  Gastrointestinal:  Negative for abdominal pain, blood in stool, constipation and diarrhea.  Genitourinary:  Negative for dysuria and hematuria.  Musculoskeletal:  Negative for back pain, gait problem and myalgias.  Skin:  Negative for rash.  Neurological:  Negative for dizziness, weakness and headaches.  Psychiatric/Behavioral:  Negative for suicidal ideas.   All other systems  reviewed and are negative.   Per HPI unless specifically indicated above   Allergies as of 06/13/2023   No Known Allergies      Medication List        Accurate as of Jun 13, 2023  1:42 PM. If you have any questions, ask your nurse or doctor.          STOP taking these medications    benzonatate  100 MG capsule Commonly known as: Tessalon  Perles Stopped by: Lucio Sabin Gerik Coberly   pantoprazole  40 MG tablet Commonly known as: PROTONIX  Stopped by: Lucio Sabin Jensyn Shave       TAKE these medications    atorvastatin  20 MG tablet Commonly known as: LIPITOR Take 1 tablet (20 mg total) by mouth daily at 4 PM.   levothyroxine  112 MCG tablet Commonly known as: SYNTHROID  Take 1 tablet (112 mcg total) by mouth daily.         Objective:   BP 123/76   Pulse 88   Temp 98 F (36.7 C)   Ht 5\' 10"  (1.778 m)   Wt 179 lb (81.2 kg)   SpO2 96%   BMI 25.68 kg/m   Wt Readings from Last 3 Encounters:  06/13/23 179 lb (81.2 kg)  02/27/23 178 lb (80.7 kg)  12/18/22 176 lb (79.8 kg)    Physical Exam Vitals reviewed.  Constitutional:      General: He is not in acute distress.    Appearance: He is well-developed. He is not diaphoretic.  HENT:     Right Ear: External ear  normal.     Left Ear: External ear normal.     Nose: Nose normal.     Mouth/Throat:     Pharynx: No oropharyngeal exudate.  Eyes:     General: No scleral icterus.       Right eye: No discharge.     Conjunctiva/sclera: Conjunctivae normal.     Pupils: Pupils are equal, round, and reactive to light.  Neck:     Thyroid : No thyromegaly.  Cardiovascular:     Rate and Rhythm: Normal rate and regular rhythm.     Heart sounds: Normal heart sounds. No murmur heard. Pulmonary:     Effort: Pulmonary effort is normal. No respiratory distress.     Breath sounds: Normal breath sounds. No wheezing.  Abdominal:     General: Bowel sounds are normal. There is no distension.     Palpations: Abdomen is soft.      Tenderness: There is no abdominal tenderness. There is no guarding or rebound.  Musculoskeletal:        General: No swelling. Normal range of motion.     Cervical back: Neck supple.  Lymphadenopathy:     Cervical: No cervical adenopathy.  Skin:    General: Skin is warm and dry.     Findings: No rash.  Neurological:     Mental Status: He is alert and oriented to person, place, and time.     Coordination: Coordination normal.  Psychiatric:        Behavior: Behavior normal.       Assessment & Plan:   Problem List Items Addressed This Visit       Cardiovascular and Mediastinum   Thoracic aorta atherosclerosis (HCC)     Endocrine   Hypothyroidism following radioiodine therapy   Relevant Orders   TSH     Other   Hyperlipidemia   Relevant Orders   CMP14+EGFR   Lipid panel   Other Visit Diagnoses       Physical exam    -  Primary   Relevant Orders   CBC with Differential/Platelet   CMP14+EGFR   Lipid panel   TSH   PSA, total and free     Prostate cancer screening       Relevant Orders   PSA, total and free       Will check blood work today, seems to be doing well, blood pressure and everything looks good today. Follow up plan: Return in about 6 months (around 12/14/2023), or if symptoms worsen or fail to improve, for Hypothyroidism and hyperlipidemia.  Counseling provided for all of the vaccine components Orders Placed This Encounter  Procedures   CBC with Differential/Platelet   CMP14+EGFR   Lipid panel   TSH   PSA, total and free    Jolyne Needs, MD Vickie Grana [facility] 06/13/2023, 1:42 PM

## 2023-06-14 LAB — CBC WITH DIFFERENTIAL/PLATELET
Basophils Absolute: 0 10*3/uL (ref 0.0–0.2)
Basos: 0 %
EOS (ABSOLUTE): 0.1 10*3/uL (ref 0.0–0.4)
Eos: 1 %
Hematocrit: 40.5 % (ref 37.5–51.0)
Hemoglobin: 14 g/dL (ref 13.0–17.7)
Immature Grans (Abs): 0 10*3/uL (ref 0.0–0.1)
Immature Granulocytes: 0 %
Lymphocytes Absolute: 1.1 10*3/uL (ref 0.7–3.1)
Lymphs: 19 %
MCH: 31.2 pg (ref 26.6–33.0)
MCHC: 34.6 g/dL (ref 31.5–35.7)
MCV: 90 fL (ref 79–97)
Monocytes Absolute: 0.3 10*3/uL (ref 0.1–0.9)
Monocytes: 5 %
Neutrophils Absolute: 4.2 10*3/uL (ref 1.4–7.0)
Neutrophils: 75 %
Platelets: 180 10*3/uL (ref 150–450)
RBC: 4.49 x10E6/uL (ref 4.14–5.80)
RDW: 13.2 % (ref 11.6–15.4)
WBC: 5.8 10*3/uL (ref 3.4–10.8)

## 2023-06-14 LAB — CMP14+EGFR
ALT: 15 IU/L (ref 0–44)
AST: 31 IU/L (ref 0–40)
Albumin: 4.7 g/dL (ref 3.9–4.9)
Alkaline Phosphatase: 83 IU/L (ref 44–121)
BUN/Creatinine Ratio: 10 (ref 10–24)
BUN: 12 mg/dL (ref 8–27)
Bilirubin Total: 0.6 mg/dL (ref 0.0–1.2)
CO2: 21 mmol/L (ref 20–29)
Calcium: 9.7 mg/dL (ref 8.6–10.2)
Chloride: 101 mmol/L (ref 96–106)
Creatinine, Ser: 1.22 mg/dL (ref 0.76–1.27)
Globulin, Total: 3.6 g/dL (ref 1.5–4.5)
Glucose: 84 mg/dL (ref 70–99)
Potassium: 4.2 mmol/L (ref 3.5–5.2)
Sodium: 140 mmol/L (ref 134–144)
Total Protein: 8.3 g/dL (ref 6.0–8.5)
eGFR: 64 mL/min/{1.73_m2} (ref >59)

## 2023-06-14 LAB — LIPID PANEL
Chol/HDL Ratio: 4.2 ratio (ref 0.0–5.0)
Cholesterol, Total: 214 mg/dL — ABNORMAL HIGH (ref 100–199)
HDL: 51 mg/dL (ref >39)
LDL Chol Calc (NIH): 150 mg/dL — ABNORMAL HIGH (ref 0–99)
Triglycerides: 72 mg/dL (ref 0–149)
VLDL Cholesterol Cal: 13 mg/dL (ref 5–40)

## 2023-06-14 LAB — PSA, TOTAL AND FREE
PSA, Free Pct: 22.4 %
PSA, Free: 0.56 ng/mL
Prostate Specific Ag, Serum: 2.5 ng/mL (ref 0.0–4.0)

## 2023-06-14 LAB — TSH: TSH: 0.76 u[IU]/mL (ref 0.450–4.500)

## 2023-06-21 ENCOUNTER — Ambulatory Visit: Payer: Self-pay | Admitting: Family Medicine

## 2023-06-21 MED ORDER — ATORVASTATIN CALCIUM 40 MG PO TABS: 40.0000 mg | ORAL_TABLET | Freq: Every day | ORAL | 3 refills | Status: DC

## 2023-06-21 NOTE — Addendum Note (Signed)
 Addended by: Estelle Helm on: 06/21/2023 09:47 AM   Modules accepted: Orders

## 2023-12-27 ENCOUNTER — Ambulatory Visit: Payer: Self-pay

## 2023-12-27 VITALS — BP 123/76 | HR 88 | Ht 70.0 in | Wt 179.0 lb

## 2023-12-27 DIAGNOSIS — Z1211 Encounter for screening for malignant neoplasm of colon: Secondary | ICD-10-CM

## 2023-12-27 DIAGNOSIS — Z0000 Encounter for general adult medical examination without abnormal findings: Secondary | ICD-10-CM

## 2023-12-27 NOTE — Progress Notes (Signed)
 Chief Complaint  Patient presents with   Medicare Wellness     Subjective:   Robert [name] is a 68 y.o. male who presents for a Medicare Annual Wellness Visit.  Visit info / Clinical Intake: Medicare Wellness Visit Type:: Subsequent Annual Wellness Visit Persons participating in visit and providing information:: patient Medicare Wellness Visit Mode:: Telephone If telephone:: video declined Since this visit was completed virtually, some vitals may be partially provided or unavailable. Missing vitals are due to the limitations of the virtual format.: Documented vitals are patient reported If Telephone or Video please confirm:: I connected with patient using audio/video enable telemedicine. I verified patient identity with two identifiers, discussed telehealth limitations, and patient agreed to proceed. Patient Location:: home Provider Location:: home office Interpreter Needed?: No Pre-visit prep was completed: yes AWV questionnaire completed by patient prior to visit?: no Living arrangements:: (!) lives alone Patient's Overall Health Status Rating: very good Typical amount of pain: none Does pain affect daily life?: no Are you currently prescribed opioids?: no  Dietary Habits and Nutritional Risks How many meals a day?: 2 Eats fruit and vegetables daily?: yes Most meals are obtained by: preparing own meals Diabetic:: no  Functional Status Activities of Daily Living (to include ambulation/medication): Independent Ambulation: Independent Medication Administration: Independent Home Management (perform basic housework or laundry): Independent Manage your own finances?: yes Primary transportation is: driving Concerns about hearing?: no  Fall Screening Falls in the past year?: 0 Number of falls in past year: 0 Was there an injury with Fall?: 0 Fall Risk Category Calculator: 0 Patient Fall Risk Level: Low Fall Risk  Fall Risk Fall risk Follow up: Falls evaluation completed;  Education provided  Home and Transportation Safety: All rugs have non-skid backing?: yes All stairs or steps have railings?: (!) no Grab bars in the bathtub or shower?: (!) no Have non-skid surface in bathtub or shower?: yes Good home lighting?: yes Regular seat belt use?: yes Hospital stays in the last year:: no  Cognitive Assessment Difficulty concentrating, remembering, or making decisions? : no Will 6CIT or Mini Cog be Completed: yes What year is it?: 0 points What month is it?: 0 points Give patient an address phrase to remember (5 components): [address] [place] About what time is it?: 0 points Count backwards from 20 to 1: 0 points Say the months of the year in reverse: 0 points Repeat the address phrase from earlier: 0 points 6 CIT Score: 0 points  Advance Directives (For Healthcare) Does Patient Have a Medical Advance Directive?: No Would patient like information on creating a medical advance directive?: No - Patient declined  Reviewed/Updated  Reviewed/Updated: Reviewed All (Medical, Surgical, Family, Medications, Allergies, Care Teams, Patient Goals); Medical History; Surgical History; Family History; Medications; Allergies; Care Teams; Patient Goals    Allergies (verified) Patient has no known allergies.   Current Medications (verified) Outpatient Encounter Medications as of 12/27/2023  Medication Sig   atorvastatin  (LIPITOR) 40 MG tablet Take 40 mg by mouth daily.   atorvastatin  (LIPITOR) 40 MG tablet Take 1 tablet (40 mg total) by mouth daily.   levothyroxine  (SYNTHROID ) 112 MCG tablet Take 1 tablet (112 mcg total) by mouth daily.   [DISCONTINUED] diphenhydrAMINE  (BENADRYL ) 25 MG tablet Take 25-50 mg by mouth daily as needed. For allergic reaction   [DISCONTINUED] fexofenadine  (ALLEGRA ) 180 MG tablet 1 po qAM for hives and itching   No facility-administered encounter medications on file as of 12/27/2023.    History: Past Medical History:  Diagnosis Date   Thyroid  disease    Past Surgical History:  Procedure Laterality Date   COL[facility]OSCOPY  1980's   in [facility]   HARDWARE REMOVAL Left 08/10/2020   Procedure: HARDWARE REMOVAL;  Surgeon: Kendal Franky SQUIBB, MD;  Location: [facility] OR;  Service: Orthopedics;  Laterality: Left;   HERNIA REPAIR  03/2014   Umbilical   KNEE ARTHROSCOPY Left 08/10/2020   Procedure: ARTHROSCOPY KNEE WITH CHONDROPLASTY; PARTIAL MEDIAL MENISCECTOMY LYSIS OF ADHESIONS AND MANIPULATION;  Surgeon: Cristy Bonner DASEN, MD;  Location: [facility] OR;  Service: Orthopedics;  Laterality: Left;   NO PAST SURGERIES     ORIF PATELLA Left 12/11/2019   Procedure: OPEN REDUCTION INTERNAL (ORIF) FIXATION PATELLA;  Surgeon: Kendal Franky SQUIBB, MD;  Location: [facility] OR;  Service: Orthopedics;  Laterality: Left;   Family History  Problem Relation Age of Onset   Heart Problems Mother    Suicidality Father    Thyroid  disease Neg Hx    Colon cancer Neg Hx    Social History   Occupational History   Occupation: administer  Tobacco Use   Smoking status: Former    Current packs/day: 0.00    Average packs/day: 0.3 packs/day for 10.0 years (2.5 ttl pk-yrs)    Types: Cigarettes    Start date: 12/08/2000    Quit date: 12/09/2010    Years since quitting: 13.0   Smokeless tobacco: Never  Vaping Use   Vaping status: Never Used  Substance and Sexual Activity   Alcohol use: Never   Drug use: Not Currently    Types: Cocaine    Comment: previous cocaine user. clean for 9 years   Sexual activity: Not Currently   Tobacco Counseling Counseling given: Yes  SDOH Screenings   Food Insecurity: No Food Insecurity (12/27/2023)  Housing: Unknown (12/27/2023)  Transportation Needs: No Transportation Needs (12/27/2023)  Utilities: Not At Risk (12/27/2023)  Alcohol Screen: Low Risk  (12/18/2022)  Depression (PHQ2-9): Low Risk  (12/27/2023)  Financial Resource Strain: Low Risk  (12/18/2022)  Physical Activity: Insufficiently Active (12/27/2023)  Social  Connections: Moderately Integrated (12/27/2023)  Stress: No Stress Concern Present (12/27/2023)  Tobacco Use: Medium Risk (12/27/2023)  Health Literacy: Adequate Health Literacy (12/27/2023)   See flowsheets for full screening details  Depression Screen Depression Screening Exception Documentation Depression Screening Exception:: Patient refusal  PHQ 2 & 9 Depression Scale- Over the past 2 weeks, how often have you been bothered by any of the following problems? Little interest or pleasure in doing things: 0 Feeling down, depressed, or hopeless (PHQ Adolescent also includes...irritable): 0 PHQ-2 Total Score: 0     Goals Addressed   None          Objective:    Today's Vitals   12/27/23 0918  BP: 123/76  Pulse: 88  Weight: 179 lb (81.2 kg)  Height: 5' 10 (1.778 m)   Body mass index is 25.68 kg/m.  Hearing/Vision screen Hearing Screening - Comments:: Pt denies hearing dif Vision Screening - Comments:: Pt wear glasses/[facility] In [place], [place]/last 05/2023 Immunizations and Health Maintenance Health Maintenance  Topic Date Due   COVID-19 Vaccine (1) Never done   Zoster Vaccines- Shingrix (1 of 2) Never done   Pneumococcal Vaccine: 50+ Years (2 of 2 - PCV20 or PCV21) 05/07/2020   Influenza Vaccine  08/23/2023   Colonoscopy  03/15/2024   Medicare Annual Wellness (AWV)  12/26/2024   DTaP/Tdap/Td (2 - Td or Tdap) 12/09/2029   Hepatitis C Screening  Completed   Meningococcal B Vaccine  Aged Out        Assessment/Plan:  This is a routine wellness examination for Robert [name].  Patient Care Team: Dettinger, Fonda LABOR, MD as PCP - General (Family Medicine) Dettinger, Fonda LABOR, MD (Family Medicine) Vicci Mcardle, OD [facility])  I have personally reviewed and noted the following in the patient's chart:   Medical and social history Use of alcohol, tobacco or illicit drugs  Current medications and supplements including opioid prescriptions. Functional ability and  status Nutritional status Physical activity Advanced directives List of other physicians Hospitalizations, surgeries, and ER visits in previous 12 months Vitals Screenings to include cognitive, depression, and falls Referrals and appointments  No orders of the defined types were placed in this encounter.  In addition, I have reviewed and discussed with patient certain preventive protocols, quality metrics, and best practice recommendations. A written personalized care plan for preventive services as well as general preventive health recommendations were provided to patient.   Ozie Ned, CMA   12/27/2023   Return in 1 year (on 12/26/2024).  After Visit Summary: (MyChart) Due to this being a telephonic visit, the after visit summary with patients personalized plan was offered to patient via MyChart   Nurse Notes: Per pt is aware and due the following: pneumonia, flu, covid, shingles vaccines. Colonoscopy due--order placed.   During awv also stated that he has a head-cold, pt is taking meds. Suggest to pt if he symptoms worsen to call pcp/seek medical attention, pt agreed

## 2024-02-12 ENCOUNTER — Encounter: Payer: Self-pay | Admitting: Family Medicine

## 2024-02-12 ENCOUNTER — Ambulatory Visit: Admitting: Family Medicine

## 2024-02-12 VITALS — BP 122/67 | HR 75 | Ht 70.0 in | Wt 183.0 lb

## 2024-02-12 DIAGNOSIS — E890 Postprocedural hypothyroidism: Secondary | ICD-10-CM | POA: Diagnosis not present

## 2024-02-12 DIAGNOSIS — E782 Mixed hyperlipidemia: Secondary | ICD-10-CM | POA: Diagnosis not present

## 2024-02-12 LAB — LIPID PANEL

## 2024-02-12 MED ORDER — ATORVASTATIN CALCIUM 40 MG PO TABS: 40.0000 mg | ORAL_TABLET | Freq: Every day | ORAL | 3 refills | Status: AC

## 2024-02-12 MED ORDER — LEVOTHYROXINE SODIUM 112 MCG PO TABS: 112.0000 ug | ORAL_TABLET | Freq: Every day | ORAL | 1 refills | Status: DC

## 2024-02-12 NOTE — Progress Notes (Signed)
 "  BP 122/67   Pulse 75   Ht 5' 10 (1.778 m)   Wt 183 lb (83 kg)   SpO2 98%   BMI 26.26 kg/m    Subjective:   Patient ID: Robert [name], male    DOB: May 01, 1955, 69 y.o.   MRN: [id number]  HPI: Robert [name] is a 69 y.o. male presenting on 02/12/2024 for Medical Management of Chronic Issues, Hypothyroidism, and Hyperlipidemia   Discussed the use of AI scribe software for clinical note transcription with the patient, who gave verbal consent to proceed.  History of Present Illness   Robert [name] is a 69 year old male who presents for a routine follow-up visit.  Medication adherence and tolerance - Adherent to thyroid  and cholesterol medications - No adverse effects from current medications  Pruritus and xerosis - Pruritus primarily affecting the legs - Attributes symptoms to xerosis - Uses cortisone cream with effective symptom relief - Identifies sauna use and changing weather as contributing factors to dryness          Relevant past medical, surgical, family and social history reviewed and updated as indicated. Interim medical history since our last visit reviewed. Allergies and medications reviewed and updated.  Review of Systems  Constitutional:  Negative for chills and fever.  Eyes:  Negative for visual disturbance.  Respiratory:  Negative for shortness of breath and wheezing.   Cardiovascular:  Negative for chest pain and leg swelling.  Musculoskeletal:  Negative for back pain and gait problem.  Skin:  Negative for rash.  Neurological:  Negative for dizziness and light-headedness.  All other systems reviewed and are negative.   Per HPI unless specifically indicated above   Allergies as of 02/12/2024   No Known Allergies      Medication List        Accurate as of February 12, 2024  3:39 PM. If you have any questions, ask your nurse or doctor.          atorvastatin  40 MG tablet Commonly known as: LIPITOR Take 1 tablet (40 mg total) by mouth  daily. What changed: Another medication with the same name was removed. Continue taking this medication, and follow the directions you see here. Changed by: Fonda Levins, MD   levothyroxine  112 MCG tablet Commonly known as: SYNTHROID  Take 1 tablet (112 mcg total) by mouth daily.         Objective:   BP 122/67   Pulse 75   Ht 5' 10 (1.778 m)   Wt 183 lb (83 kg)   SpO2 98%   BMI 26.26 kg/m   Wt Readings from Last 3 Encounters:  02/12/24 183 lb (83 kg)  12/27/23 179 lb (81.2 kg)  06/13/23 179 lb (81.2 kg)    Physical Exam Physical Exam   VITALS: BP- 122/67 CHEST: Lungs clear to auscultation bilaterally. CARDIOVASCULAR: Heart regular rate and rhythm, no murmurs. EXTREMITIES: Pulses intact bilaterally in lower extremities. No edema in extremities.         Assessment & Plan:   Problem List Items Addressed This Visit       Endocrine   Hypothyroidism following radioiodine therapy - Primary   Relevant Medications   levothyroxine  (SYNTHROID ) 112 MCG tablet   Other Relevant Orders   CBC with Differential/Platelet   CMP14+EGFR   Lipid panel   TSH     Other   Hyperlipidemia   Relevant Medications   atorvastatin  (LIPITOR) 40 MG tablet   Other Relevant Orders   CBC  with Differential/Platelet   CMP14+EGFR   Lipid panel   TSH       Postprocedural hypothyroidism Well-managed with current thyroid  medication. - Continue current thyroid  medication.  Mixed hyperlipidemia Well-managed with current cholesterol medication. - Continue current cholesterol medication.  Xerosis cutis Intermittent dry skin itching, likely exacerbated by environmental factors. Cortisone cream effective. - Continue using cortisone cream for itching. - Consider using CeraVe or Cetaphil lotion for skin hydration. - Use a humidifier in the room at night to maintain moisture.          Follow up plan: Return in about 6 months (around 08/11/2024), or if symptoms worsen or fail to  improve, for Physical exam and thyroid  and cholesterol recheck.  Counseling provided for all of the vaccine components Orders Placed This Encounter  Procedures   CBC with Differential/Platelet   CMP14+EGFR   Lipid panel   TSH    Fonda Levins, MD [facility] 02/12/2024, 3:39 PM     "

## 2024-02-13 LAB — CBC WITH DIFFERENTIAL/PLATELET
Basophils Absolute: 0 x10E3/uL (ref 0.0–0.2)
Basos: 0 %
EOS (ABSOLUTE): 0 x10E3/uL (ref 0.0–0.4)
Eos: 1 %
Hematocrit: 38.8 % (ref 37.5–51.0)
Hemoglobin: 13 g/dL (ref 13.0–17.7)
Immature Grans (Abs): 0 x10E3/uL (ref 0.0–0.1)
Immature Granulocytes: 0 %
Lymphocytes Absolute: 1.2 x10E3/uL (ref 0.7–3.1)
Lymphs: 27 %
MCH: 32.1 pg (ref 26.6–33.0)
MCHC: 33.5 g/dL (ref 31.5–35.7)
MCV: 96 fL (ref 79–97)
Monocytes Absolute: 0.3 x10E3/uL (ref 0.1–0.9)
Monocytes: 6 %
Neutrophils Absolute: 2.9 x10E3/uL (ref 1.4–7.0)
Neutrophils: 66 %
Platelets: 206 x10E3/uL (ref 150–450)
RBC: 4.05 x10E6/uL — ABNORMAL LOW (ref 4.14–5.80)
RDW: 13.3 % (ref 11.6–15.4)
WBC: 4.4 x10E3/uL (ref 3.4–10.8)

## 2024-02-13 LAB — CMP14+EGFR
ALT: 19 IU/L (ref 0–44)
AST: 29 IU/L (ref 0–40)
Albumin: 4.5 g/dL (ref 3.9–4.9)
Alkaline Phosphatase: 58 IU/L (ref 47–123)
BUN/Creatinine Ratio: 9 — AB (ref 10–24)
BUN: 11 mg/dL (ref 8–27)
Bilirubin Total: 0.4 mg/dL (ref 0.0–1.2)
CO2: 21 mmol/L (ref 20–29)
Calcium: 9.3 mg/dL (ref 8.6–10.2)
Chloride: 103 mmol/L (ref 96–106)
Creatinine, Ser: 1.23 mg/dL (ref 0.76–1.27)
Globulin, Total: 3.2 g/dL (ref 1.5–4.5)
Glucose: 79 mg/dL (ref 70–99)
Potassium: 4 mmol/L (ref 3.5–5.2)
Sodium: 141 mmol/L (ref 134–144)
Total Protein: 7.7 g/dL (ref 6.0–8.5)
eGFR: 63 mL/min/1.73

## 2024-02-13 LAB — LIPID PANEL
Cholesterol, Total: 186 mg/dL (ref 100–199)
HDL: 52 mg/dL
LDL CALC COMMENT:: 3.6 ratio (ref 0.0–5.0)
LDL Chol Calc (NIH): 113 mg/dL — AB (ref 0–99)
Triglycerides: 116 mg/dL (ref 0–149)
VLDL Cholesterol Cal: 21 mg/dL (ref 5–40)

## 2024-02-13 LAB — TSH: TSH: 22 u[IU]/mL — AB (ref 0.450–4.500)

## 2024-02-19 ENCOUNTER — Other Ambulatory Visit: Payer: Self-pay

## 2024-02-19 ENCOUNTER — Ambulatory Visit: Payer: Self-pay | Admitting: Family Medicine

## 2024-02-19 MED ORDER — LEVOTHYROXINE SODIUM 125 MCG PO TABS: 125.0000 ug | ORAL_TABLET | Freq: Every day | ORAL | 1 refills | Status: AC

## 2024-08-24 ENCOUNTER — Encounter: Admitting: Family Medicine
